# Patient Record
Sex: Female | Born: 1965
Health system: Southern US, Community
[De-identification: ages and names within clinical notes are randomized; demographics above are authoritative.]

## PROBLEM LIST (undated history)

## (undated) DIAGNOSIS — E119 Type 2 diabetes mellitus without complications: Secondary | ICD-10-CM

## (undated) DIAGNOSIS — R011 Cardiac murmur, unspecified: Secondary | ICD-10-CM

## (undated) DIAGNOSIS — F419 Anxiety disorder, unspecified: Secondary | ICD-10-CM

## (undated) DIAGNOSIS — I471 Supraventricular tachycardia, unspecified: Secondary | ICD-10-CM

## (undated) DIAGNOSIS — Z9889 Other specified postprocedural states: Secondary | ICD-10-CM

## (undated) DIAGNOSIS — E785 Hyperlipidemia, unspecified: Secondary | ICD-10-CM

## (undated) DIAGNOSIS — I10 Essential (primary) hypertension: Secondary | ICD-10-CM

## (undated) HISTORY — PX: ABDOMINAL HYSTERECTOMY: SHX81

## (undated) HISTORY — DX: Type 2 diabetes mellitus without complications: E11.9

## (undated) HISTORY — DX: Hyperlipidemia, unspecified: E78.5

## (undated) HISTORY — PX: BREAST BIOPSY: SHX20

## (undated) HISTORY — DX: Anxiety disorder, unspecified: F41.9

## (undated) HISTORY — DX: Cardiac murmur, unspecified: R01.1

## (undated) HISTORY — PX: OTHER SURGICAL HISTORY: SHX169

## (undated) HISTORY — DX: Essential (primary) hypertension: I10

## (undated) HISTORY — PX: COLONOSCOPY: SHX174

---

## 2006-02-10 ENCOUNTER — Ambulatory Visit: Payer: Self-pay | Admitting: Obstetrics and Gynecology

## 2006-10-28 ENCOUNTER — Emergency Department: Payer: Self-pay | Admitting: Internal Medicine

## 2012-06-28 ENCOUNTER — Ambulatory Visit: Payer: Self-pay | Admitting: Family Medicine

## 2012-08-13 ENCOUNTER — Emergency Department: Payer: Self-pay | Admitting: Emergency Medicine

## 2012-08-13 LAB — URINALYSIS, COMPLETE
Glucose,UR: NEGATIVE mg/dL (ref 0–75)
Ketone: NEGATIVE
Leukocyte Esterase: NEGATIVE
Nitrite: NEGATIVE
Ph: 7 (ref 4.5–8.0)
Protein: NEGATIVE
RBC,UR: <1 /HPF (ref 0–5)
WBC UR: 1 /HPF (ref 0–5)

## 2012-08-13 LAB — COMPREHENSIVE METABOLIC PANEL
Albumin: 3.4 g/dL (ref 3.4–5.0)
Alkaline Phosphatase: 55 U/L (ref 50–136)
Anion Gap: 10 (ref 7–16)
BUN: 9 mg/dL (ref 7–18)
Bilirubin,Total: 0.2 mg/dL (ref 0.2–1.0)
Calcium, Total: 7.8 mg/dL — ABNORMAL LOW (ref 8.5–10.1)
Chloride: 110 mmol/L — ABNORMAL HIGH (ref 98–107)
Creatinine: 0.61 mg/dL (ref 0.60–1.30)
Glucose: 101 mg/dL — ABNORMAL HIGH (ref 65–99)
Potassium: 3.5 mmol/L (ref 3.5–5.1)
SGPT (ALT): 18 U/L (ref 12–78)
Sodium: 144 mmol/L (ref 136–145)
Total Protein: 6.5 g/dL (ref 6.4–8.2)

## 2012-08-13 LAB — CBC
HCT: 36.2 % (ref 35.0–47.0)
HGB: 12.8 g/dL (ref 12.0–16.0)
MCHC: 35.3 g/dL (ref 32.0–36.0)
MCV: 91 fL (ref 80–100)
Platelet: 218 10*3/uL (ref 150–440)
RBC: 3.96 10*6/uL (ref 3.80–5.20)
RDW: 12.9 % (ref 11.5–14.5)
WBC: 6.3 10*3/uL (ref 3.6–11.0)

## 2012-08-13 LAB — TROPONIN I: Troponin-I: <0.02 ng/mL

## 2012-08-16 ENCOUNTER — Emergency Department: Payer: Self-pay | Admitting: Emergency Medicine

## 2012-08-16 LAB — BASIC METABOLIC PANEL
BUN: 12 mg/dL (ref 7–18)
Calcium, Total: 8.9 mg/dL (ref 8.5–10.1)
Co2: 27 mmol/L (ref 21–32)
EGFR (Non-African Amer.): >60
Glucose: 113 mg/dL — ABNORMAL HIGH (ref 65–99)
Potassium: 3.9 mmol/L (ref 3.5–5.1)
Sodium: 142 mmol/L (ref 136–145)

## 2012-08-16 LAB — CBC WITH DIFFERENTIAL/PLATELET
Basophil %: 0.7 %
Eosinophil %: 3.9 %
MCH: 30.6 pg (ref 26.0–34.0)
MCHC: 33.8 g/dL (ref 32.0–36.0)
MCV: 91 fL (ref 80–100)
Monocyte %: 7.1 %
Neutrophil %: 50.4 %
Platelet: 209 10*3/uL (ref 150–440)

## 2012-08-16 LAB — TROPONIN I: Troponin-I: <0.02 ng/mL

## 2013-07-02 ENCOUNTER — Ambulatory Visit: Payer: Self-pay | Admitting: Family Medicine

## 2013-09-02 ENCOUNTER — Ambulatory Visit: Payer: Self-pay | Admitting: Obstetrics & Gynecology

## 2013-09-02 LAB — BASIC METABOLIC PANEL
Anion Gap: 7 (ref 7–16)
BUN: 15 mg/dL (ref 7–18)
Chloride: 105 mmol/L (ref 98–107)
Co2: 27 mmol/L (ref 21–32)
Creatinine: 0.54 mg/dL — ABNORMAL LOW (ref 0.60–1.30)
EGFR (African American): >60
EGFR (Non-African Amer.): >60
Osmolality: 278 (ref 275–301)
Potassium: 4.2 mmol/L (ref 3.5–5.1)

## 2013-09-02 LAB — HEMOGLOBIN: HGB: 11.3 g/dL — ABNORMAL LOW (ref 12.0–16.0)

## 2013-09-02 LAB — PREGNANCY, URINE: Pregnancy Test, Urine: NEGATIVE m[IU]/mL

## 2013-09-03 ENCOUNTER — Emergency Department: Payer: Self-pay | Admitting: Emergency Medicine

## 2013-09-03 LAB — CBC
HCT: 34 % — ABNORMAL LOW (ref 35.0–47.0)
HGB: 11.4 g/dL — ABNORMAL LOW (ref 12.0–16.0)
MCH: 28.8 pg (ref 26.0–34.0)
MCHC: 33.6 g/dL (ref 32.0–36.0)
MCV: 86 fL (ref 80–100)
Platelet: 282 10*3/uL (ref 150–440)
RBC: 3.97 10*6/uL (ref 3.80–5.20)
RDW: 13.9 % (ref 11.5–14.5)
WBC: 7.2 10*3/uL (ref 3.6–11.0)

## 2013-09-03 LAB — COMPREHENSIVE METABOLIC PANEL
Alkaline Phosphatase: 63 U/L
Anion Gap: 4 — ABNORMAL LOW (ref 7–16)
BUN: 13 mg/dL (ref 7–18)
Bilirubin,Total: 0.1 mg/dL — ABNORMAL LOW (ref 0.2–1.0)
Co2: 29 mmol/L (ref 21–32)
Creatinine: 0.74 mg/dL (ref 0.60–1.30)
Potassium: 3.7 mmol/L (ref 3.5–5.1)
SGOT(AST): 28 U/L (ref 15–37)
SGPT (ALT): 31 U/L (ref 12–78)
Total Protein: 7.8 g/dL (ref 6.4–8.2)

## 2013-09-03 LAB — TROPONIN I: Troponin-I: <0.02 ng/mL

## 2013-09-10 ENCOUNTER — Ambulatory Visit: Payer: Self-pay | Admitting: Obstetrics & Gynecology

## 2013-09-11 LAB — HEMOGLOBIN: HGB: 8.8 g/dL — ABNORMAL LOW (ref 12.0–16.0)

## 2013-09-12 LAB — PATHOLOGY REPORT

## 2013-09-12 LAB — CBC WITH DIFFERENTIAL/PLATELET
Basophil #: 0 10*3/uL (ref 0.0–0.1)
Basophil %: 0.3 %
Eosinophil %: 0.4 %
HCT: 29.9 % — ABNORMAL LOW (ref 35.0–47.0)
HGB: 10.4 g/dL — ABNORMAL LOW (ref 12.0–16.0)
Lymphocyte #: 1.1 10*3/uL (ref 1.0–3.6)
Lymphocyte %: 11 %
MCH: 29.7 pg (ref 26.0–34.0)
MCHC: 34.7 g/dL (ref 32.0–36.0)
Monocyte #: 0.5 x10 3/mm (ref 0.2–0.9)
Monocyte %: 4.7 %
RBC: 3.5 10*6/uL — ABNORMAL LOW (ref 3.80–5.20)
RDW: 13.8 % (ref 11.5–14.5)
WBC: 9.9 10*3/uL (ref 3.6–11.0)

## 2013-09-13 ENCOUNTER — Observation Stay: Payer: Self-pay | Admitting: Internal Medicine

## 2013-09-13 LAB — URINALYSIS, COMPLETE
Bacteria: NONE SEEN
Ketone: NEGATIVE
Nitrite: NEGATIVE
Ph: 8 (ref 4.5–8.0)
Protein: NEGATIVE
Specific Gravity: 1.002 (ref 1.003–1.030)
Squamous Epithelial: NONE SEEN
WBC UR: <1 /HPF (ref 0–5)

## 2013-09-13 LAB — MAGNESIUM: Magnesium: 2.2 mg/dL

## 2013-09-13 LAB — COMPREHENSIVE METABOLIC PANEL
Albumin: 3.7 g/dL (ref 3.4–5.0)
Alkaline Phosphatase: 61 U/L
Anion Gap: 9 (ref 7–16)
BUN: 5 mg/dL — ABNORMAL LOW (ref 7–18)
Calcium, Total: 8.7 mg/dL (ref 8.5–10.1)
Chloride: 97 mmol/L — ABNORMAL LOW (ref 98–107)
Co2: 27 mmol/L (ref 21–32)
Creatinine: 0.68 mg/dL (ref 0.60–1.30)
EGFR (Non-African Amer.): >60
Glucose: 154 mg/dL — ABNORMAL HIGH (ref 65–99)
Potassium: 3.2 mmol/L — ABNORMAL LOW (ref 3.5–5.1)
SGPT (ALT): 21 U/L (ref 12–78)
Total Protein: 7.7 g/dL (ref 6.4–8.2)

## 2013-09-13 LAB — CK-MB
CK-MB: 0.9 ng/mL (ref 0.5–3.6)
CK-MB: 0.7 ng/mL (ref 0.5–3.6)
CK-MB: 0.5 ng/mL (ref 0.5–3.6)

## 2013-09-13 LAB — POTASSIUM: Potassium: 4.7 mmol/L (ref 3.5–5.1)

## 2013-09-13 LAB — TROPONIN I: Troponin-I: <0.02 ng/mL

## 2013-09-21 ENCOUNTER — Emergency Department: Payer: Self-pay | Admitting: Emergency Medicine

## 2013-09-21 LAB — BASIC METABOLIC PANEL
BUN: 13 mg/dL (ref 7–18)
Chloride: 106 mmol/L (ref 98–107)
Creatinine: 0.76 mg/dL (ref 0.60–1.30)
EGFR (African American): >60
EGFR (Non-African Amer.): >60
Glucose: 106 mg/dL — ABNORMAL HIGH (ref 65–99)
Osmolality: 276 (ref 275–301)
Potassium: 4.5 mmol/L (ref 3.5–5.1)
Sodium: 138 mmol/L (ref 136–145)

## 2013-09-21 LAB — TROPONIN I: Troponin-I: <0.02 ng/mL

## 2013-09-21 LAB — CBC
HCT: 32 % — ABNORMAL LOW
HGB: 11 g/dL — ABNORMAL LOW
MCH: 29 pg
MCHC: 34.3 g/dL
MCV: 85 fL
Platelet: 301 x10 3/mm 3
RBC: 3.78 X10 6/mm 3 — ABNORMAL LOW
RDW: 13.7 %
WBC: 8.3 x10 3/mm 3

## 2013-09-26 ENCOUNTER — Encounter: Payer: Self-pay | Admitting: Obstetrics & Gynecology

## 2013-10-07 ENCOUNTER — Emergency Department: Payer: Self-pay | Admitting: Emergency Medicine

## 2013-10-07 LAB — CBC
MCV: 85 fL (ref 80–100)
RBC: 3.64 10*6/uL — ABNORMAL LOW (ref 3.80–5.20)
WBC: 8.9 10*3/uL (ref 3.6–11.0)

## 2013-10-07 LAB — URINALYSIS, COMPLETE
Glucose,UR: NEGATIVE mg/dL (ref 0–75)
Ph: 6 (ref 4.5–8.0)
Specific Gravity: 1.013 (ref 1.003–1.030)
Squamous Epithelial: 1
WBC UR: 63 /HPF (ref 0–5)

## 2013-10-07 LAB — COMPREHENSIVE METABOLIC PANEL
Albumin: 3.3 g/dL — ABNORMAL LOW (ref 3.4–5.0)
Alkaline Phosphatase: 59 U/L
Anion Gap: 7 (ref 7–16)
Calcium, Total: 8.7 mg/dL (ref 8.5–10.1)
Chloride: 103 mmol/L (ref 98–107)
Co2: 25 mmol/L (ref 21–32)
Creatinine: 0.8 mg/dL (ref 0.60–1.30)
EGFR (African American): >60
EGFR (Non-African Amer.): >60
Glucose: 103 mg/dL — ABNORMAL HIGH (ref 65–99)
SGOT(AST): 17 U/L (ref 15–37)

## 2013-10-07 LAB — TROPONIN I: Troponin-I: <0.02 ng/mL

## 2013-10-07 LAB — CK TOTAL AND CKMB (NOT AT ARMC): CK-MB: <0.5 ng/mL — ABNORMAL LOW (ref 0.5–3.6)

## 2013-10-07 LAB — PRO B NATRIURETIC PEPTIDE: B-Type Natriuretic Peptide: 239 pg/mL — ABNORMAL HIGH (ref 0–125)

## 2013-10-08 ENCOUNTER — Emergency Department: Payer: Self-pay | Admitting: Emergency Medicine

## 2013-10-08 LAB — CBC WITH DIFFERENTIAL/PLATELET
Basophil %: 0.5 %
Eosinophil %: 0.6 %
HCT: 31.6 % — ABNORMAL LOW (ref 35.0–47.0)
HGB: 10.6 g/dL — ABNORMAL LOW (ref 12.0–16.0)
Lymphocyte #: 2.3 10*3/uL (ref 1.0–3.6)
Lymphocyte %: 26.6 %
MCH: 29 pg (ref 26.0–34.0)
MCHC: 33.6 g/dL (ref 32.0–36.0)
Monocyte #: 0.8 x10 3/mm (ref 0.2–0.9)
Monocyte %: 9.3 %
Neutrophil #: 5.4 10*3/uL (ref 1.4–6.5)
WBC: 8.6 10*3/uL (ref 3.6–11.0)

## 2013-10-08 LAB — BASIC METABOLIC PANEL
Calcium, Total: 8.6 mg/dL (ref 8.5–10.1)
Chloride: 105 mmol/L (ref 98–107)
Co2: 24 mmol/L (ref 21–32)
Creatinine: 0.62 mg/dL (ref 0.60–1.30)
EGFR (African American): >60
EGFR (Non-African Amer.): >60
Glucose: 98 mg/dL (ref 65–99)
Osmolality: 268 (ref 275–301)

## 2013-10-08 LAB — TROPONIN I: Troponin-I: <0.02 ng/mL

## 2013-10-10 ENCOUNTER — Encounter: Payer: Self-pay | Admitting: Obstetrics & Gynecology

## 2013-10-11 ENCOUNTER — Ambulatory Visit: Payer: Self-pay | Admitting: Neurology

## 2013-10-29 DIAGNOSIS — E785 Hyperlipidemia, unspecified: Secondary | ICD-10-CM | POA: Insufficient documentation

## 2013-10-29 DIAGNOSIS — F419 Anxiety disorder, unspecified: Secondary | ICD-10-CM | POA: Insufficient documentation

## 2013-11-01 ENCOUNTER — Emergency Department: Payer: Self-pay | Admitting: Emergency Medicine

## 2013-11-01 LAB — CBC
HCT: 32.9 % — ABNORMAL LOW (ref 35.0–47.0)
HGB: 11 g/dL — ABNORMAL LOW (ref 12.0–16.0)
MCH: 29.1 pg (ref 26.0–34.0)
MCHC: 33.4 g/dL (ref 32.0–36.0)
MCV: 87 fL (ref 80–100)
PLATELETS: 250 10*3/uL (ref 150–440)
RBC: 3.78 10*6/uL — ABNORMAL LOW (ref 3.80–5.20)
RDW: 14.4 % (ref 11.5–14.5)
WBC: 8.1 10*3/uL (ref 3.6–11.0)

## 2013-11-01 LAB — HEPATIC FUNCTION PANEL A (ARMC)
ALBUMIN: 3.7 g/dL (ref 3.4–5.0)
ALK PHOS: 52 U/L
Bilirubin, Direct: <0.1 mg/dL (ref 0.00–0.20)
Bilirubin,Total: 0.2 mg/dL (ref 0.2–1.0)
SGOT(AST): 21 U/L (ref 15–37)
SGPT (ALT): 24 U/L (ref 12–78)
Total Protein: 7.4 g/dL (ref 6.4–8.2)

## 2013-11-01 LAB — BASIC METABOLIC PANEL
ANION GAP: 5 — AB (ref 7–16)
BUN: 10 mg/dL (ref 7–18)
CHLORIDE: 106 mmol/L (ref 98–107)
CREATININE: 0.69 mg/dL (ref 0.60–1.30)
Calcium, Total: 8.8 mg/dL (ref 8.5–10.1)
Co2: 24 mmol/L (ref 21–32)
EGFR (African American): >60
EGFR (Non-African Amer.): >60
Glucose: 106 mg/dL — ABNORMAL HIGH (ref 65–99)
Osmolality: 270 (ref 275–301)
Potassium: 4 mmol/L (ref 3.5–5.1)
Sodium: 135 mmol/L — ABNORMAL LOW (ref 136–145)

## 2013-11-01 LAB — LIPASE, BLOOD: Lipase: 228 U/L (ref 73–393)

## 2013-11-01 LAB — TROPONIN I

## 2013-11-02 LAB — URINALYSIS, COMPLETE
Bacteria: NONE SEEN
Bilirubin,UR: NEGATIVE
Blood: NEGATIVE
Glucose,UR: NEGATIVE mg/dL (ref 0–75)
KETONE: NEGATIVE
Leukocyte Esterase: NEGATIVE
NITRITE: NEGATIVE
PH: 5 (ref 4.5–8.0)
PROTEIN: NEGATIVE
SPECIFIC GRAVITY: 1.004 (ref 1.003–1.030)
Squamous Epithelial: NONE SEEN
WBC UR: <1 /HPF (ref 0–5)

## 2013-11-02 LAB — CK: CK, TOTAL: 80 U/L (ref 21–215)

## 2013-11-02 LAB — TROPONIN I: Troponin-I: <0.02 ng/mL

## 2013-11-10 ENCOUNTER — Encounter: Payer: Self-pay | Admitting: Obstetrics & Gynecology

## 2013-12-08 ENCOUNTER — Encounter: Payer: Self-pay | Admitting: Obstetrics & Gynecology

## 2013-12-25 LAB — TROPONIN I: Troponin-I: <0.02 ng/mL

## 2013-12-25 LAB — BASIC METABOLIC PANEL
ANION GAP: 3 — AB (ref 7–16)
BUN: 13 mg/dL (ref 7–18)
CHLORIDE: 108 mmol/L — AB (ref 98–107)
Calcium, Total: 8.2 mg/dL — ABNORMAL LOW (ref 8.5–10.1)
Co2: 28 mmol/L (ref 21–32)
Creatinine: 0.69 mg/dL (ref 0.60–1.30)
Glucose: 100 mg/dL — ABNORMAL HIGH (ref 65–99)
Osmolality: 278 (ref 275–301)
Potassium: 4 mmol/L (ref 3.5–5.1)
Sodium: 139 mmol/L (ref 136–145)

## 2013-12-25 LAB — CBC
HCT: 35.8 % (ref 35.0–47.0)
HGB: 11.6 g/dL — AB (ref 12.0–16.0)
MCH: 27.8 pg (ref 26.0–34.0)
MCHC: 32.4 g/dL (ref 32.0–36.0)
MCV: 86 fL (ref 80–100)
PLATELETS: 213 10*3/uL (ref 150–440)
RBC: 4.17 10*6/uL (ref 3.80–5.20)
RDW: 14.6 % — ABNORMAL HIGH (ref 11.5–14.5)
WBC: 7.5 10*3/uL (ref 3.6–11.0)

## 2013-12-26 ENCOUNTER — Observation Stay: Payer: Self-pay | Admitting: Student

## 2013-12-26 LAB — CK TOTAL AND CKMB (NOT AT ARMC)
CK, Total: 69 U/L
CK, Total: 64 U/L
CK-MB: 0.6 ng/mL (ref 0.5–3.6)
CK-MB: <0.5 ng/mL — ABNORMAL LOW (ref 0.5–3.6)

## 2013-12-26 LAB — CK-MB: CK-MB: 0.6 ng/mL (ref 0.5–3.6)

## 2013-12-26 LAB — D-DIMER(ARMC): D-Dimer: 223 ng/ml

## 2013-12-26 LAB — TROPONIN I

## 2014-01-08 ENCOUNTER — Encounter: Payer: Self-pay | Admitting: Obstetrics & Gynecology

## 2014-04-15 ENCOUNTER — Emergency Department: Payer: Self-pay | Admitting: Emergency Medicine

## 2014-04-15 LAB — COMPREHENSIVE METABOLIC PANEL
ALK PHOS: 52 U/L
ALT: 19 U/L (ref 12–78)
ANION GAP: 6 — AB (ref 7–16)
Albumin: 4 g/dL (ref 3.4–5.0)
BUN: 14 mg/dL (ref 7–18)
Bilirubin,Total: 0.4 mg/dL (ref 0.2–1.0)
CHLORIDE: 102 mmol/L (ref 98–107)
Calcium, Total: 9 mg/dL (ref 8.5–10.1)
Co2: 29 mmol/L (ref 21–32)
Creatinine: 0.72 mg/dL (ref 0.60–1.30)
EGFR (African American): >60
Glucose: 105 mg/dL — ABNORMAL HIGH (ref 65–99)
Osmolality: 275 (ref 275–301)
Potassium: 3.4 mmol/L — ABNORMAL LOW (ref 3.5–5.1)
SGOT(AST): 12 U/L — ABNORMAL LOW (ref 15–37)
Sodium: 137 mmol/L (ref 136–145)
Total Protein: 8.1 g/dL (ref 6.4–8.2)

## 2014-04-15 LAB — PROTIME-INR
INR: 1
PROTHROMBIN TIME: 13 s (ref 11.5–14.7)

## 2014-04-15 LAB — CBC
HCT: 38.7 % (ref 35.0–47.0)
HGB: 13 g/dL (ref 12.0–16.0)
MCH: 29.9 pg (ref 26.0–34.0)
MCHC: 33.6 g/dL (ref 32.0–36.0)
MCV: 89 fL (ref 80–100)
Platelet: 216 10*3/uL (ref 150–440)
RBC: 4.34 10*6/uL (ref 3.80–5.20)
RDW: 13.7 % (ref 11.5–14.5)
WBC: 8.4 10*3/uL (ref 3.6–11.0)

## 2014-04-15 LAB — CK TOTAL AND CKMB (NOT AT ARMC)
CK, Total: 101 U/L
CK-MB: 0.8 ng/mL (ref 0.5–3.6)

## 2014-04-15 LAB — TROPONIN I

## 2014-04-15 LAB — D-DIMER(ARMC): D-Dimer: 122 ng/ml

## 2014-06-30 ENCOUNTER — Ambulatory Visit: Payer: Self-pay | Admitting: Obstetrics & Gynecology

## 2014-07-28 ENCOUNTER — Ambulatory Visit: Payer: Self-pay | Admitting: Family Medicine

## 2014-08-15 ENCOUNTER — Emergency Department: Payer: Self-pay | Admitting: Emergency Medicine

## 2014-08-15 LAB — CBC
HCT: 38.7 % (ref 35.0–47.0)
HGB: 13.1 g/dL (ref 12.0–16.0)
MCH: 31.5 pg (ref 26.0–34.0)
MCHC: 34 g/dL (ref 32.0–36.0)
MCV: 93 fL (ref 80–100)
Platelet: 207 10*3/uL (ref 150–440)
RBC: 4.17 10*6/uL (ref 3.80–5.20)
RDW: 13 % (ref 11.5–14.5)
WBC: 8.8 10*3/uL (ref 3.6–11.0)

## 2014-08-15 LAB — BASIC METABOLIC PANEL
Anion Gap: 9 (ref 7–16)
BUN: 18 mg/dL (ref 7–18)
CALCIUM: 8.8 mg/dL (ref 8.5–10.1)
CHLORIDE: 103 mmol/L (ref 98–107)
Co2: 28 mmol/L (ref 21–32)
Creatinine: 0.72 mg/dL (ref 0.60–1.30)
EGFR (Non-African Amer.): >60
Glucose: 122 mg/dL — ABNORMAL HIGH (ref 65–99)
Osmolality: 283 (ref 275–301)
POTASSIUM: 3.6 mmol/L (ref 3.5–5.1)
SODIUM: 140 mmol/L (ref 136–145)

## 2014-08-16 LAB — TROPONIN I

## 2014-10-08 ENCOUNTER — Emergency Department: Payer: Self-pay | Admitting: Emergency Medicine

## 2014-10-08 LAB — BASIC METABOLIC PANEL
Anion Gap: 9 (ref 7–16)
BUN: 15 mg/dL (ref 7–18)
CHLORIDE: 105 mmol/L (ref 98–107)
CO2: 25 mmol/L (ref 21–32)
CREATININE: 0.76 mg/dL (ref 0.60–1.30)
Calcium, Total: 8.8 mg/dL (ref 8.5–10.1)
EGFR (African American): >60
EGFR (Non-African Amer.): >60
Glucose: 98 mg/dL (ref 65–99)
Osmolality: 278 (ref 275–301)
Potassium: 3.8 mmol/L (ref 3.5–5.1)
SODIUM: 139 mmol/L (ref 136–145)

## 2014-10-08 LAB — CBC
HCT: 37.9 % (ref 35.0–47.0)
HGB: 12.7 g/dL (ref 12.0–16.0)
MCH: 31.2 pg (ref 26.0–34.0)
MCHC: 33.5 g/dL (ref 32.0–36.0)
MCV: 93 fL (ref 80–100)
PLATELETS: 220 10*3/uL (ref 150–440)
RBC: 4.07 10*6/uL (ref 3.80–5.20)
RDW: 13.1 % (ref 11.5–14.5)
WBC: 7 10*3/uL (ref 3.6–11.0)

## 2014-10-08 LAB — TROPONIN I: Troponin-I: <0.02 ng/mL

## 2014-10-08 LAB — SEDIMENTATION RATE: ERYTHROCYTE SED RATE: 10 mm/h (ref 0–30)

## 2015-01-30 NOTE — H&P (Signed)
PATIENT NAME:  Felicia Barnett, Felicia Barnett MR#:  [id number] DATE OF BIRTH:  1965/11/22  DATE OF ADMISSION:  09/13/2013  REFERRING PHYSICIAN:  Dr. Sharma Covert.   PRIMARY CARE PHYSICIAN:  Lindly  CHIEF COMPLAINT:  Palpitations, chest pain.   HISTORY OF PRESENT ILLNESS:  A 49 year old Hispanic female with past medical history of hypertension, hyperlipidemia and recent hysterectomy, presenting with chest pain and palpitations.  She experienced palpitations and chest pain acutely.  She described her chest pain as retrosternal in nature, described as fluttering, 2 to 3 out of 10 in intensity, nonradiating, acute onset without associated shortness of breath, diaphoresis or nausea.  On EMS arrival, she was found to be in SVT with a heart rate around 160, improved transiently with vagal maneuvers, though upon presentation to the ED went back into SVT, heart rate remained to 150s to 160s, requiring adenosine.  She is now without complaints and normal sinus rhythm.  She has also however been complaining of left groin pain present for 6 days total in duration.  It is sharp and stabbing in quality, 10 out of 10 in intensity, located in the anterior portion of her leg with minimal radiation down her leg, worsened by walking.  No relieving factors.  No further associated symptoms.   REVIEW OF SYSTEMS:  CONSTITUTIONAL:  Denies fever, fatigue, weakness.  EYES:  Denies blurred vision, double vision, eye pain.  EARS, NOSE, THROAT:  Denies tinnitus, ear pain, hearing loss.  RESPIRATORY:  Denies cough, wheeze, shortness of breath.  CARDIOVASCULAR:  Positive for palpitations and chest pain as described above.  However, denies any edema or orthopnea.  GASTROINTESTINAL:  Denies nausea, vomiting, diarrhea, abdominal pain.  GENITOURINARY:  Denies dysuria, hematuria.  ENDOCRINE:  Denies nocturia or polyuria.  HEMATOLOGY AND LYMPHATIC:  Denies easy bruising or bleeding.  SKIN:  Denies rash or lesions. MUSCULOSKELETAL:  Denies pain in  the neck, back, shoulder, knees or arthritic symptoms.  Positive for hip pain as described above.  NEUROLOGIC:  Denies any paralysis, paresthesias.  PSYCHIATRIC:  Denies anxiety or depressive symptoms.  Otherwise, full review of systems performed by me is negative.   PAST MEDICAL HISTORY:  Hypertension, hyperlipidemia.   SOCIAL HISTORY:  Denies any alcohol, tobacco or drug usage.   FAMILY HISTORY:  Positive for hypertension and diabetes in both of her parents.   ALLERGIES:  No known drug allergies.   HOME MEDICATIONS:  Tribenzor 5/12.5/40 mg by mouth daily, Zocor 20 mg by mouth daily, vitamin D3 2000 international units by mouth daily, pantoprazole 40 mg by mouth daily.   PHYSICAL EXAMINATION: VITAL SIGNS:  Temperature 98.6, heart rate currently 76, respirations 12, blood pressure 109/61, saturating 100% on room air.  Weight 78.9 kg, BMI 34.  GENERAL:  Well-nourished, well-developed Hispanic female who is currently in no acute distress.  HEAD:  Normocephalic, atraumatic.  EYES:  Pupils equal, round and reactive to light.  Extraocular muscles intact.  No scleral icterus.  MOUTH:  Moist mucosal membranes.  Dentition intact.   EAR, NOSE, THROAT:  Throat clear without exudates.  No external lesions.  NECK:  Supple.  No thyromegaly.  No nodules.  No JVD.  PULMONARY:  Clear to auscultation bilaterally.  No wheezes, rales or rhonchi.  No use of accessory muscles.  Good respiratory effort.  CHEST:  Nontender to palpation.  CARDIOVASCULAR:  S1, S2, regular rate and rhythm.  No murmurs, rubs or gallops.  No edema.  Pedal pulses 2+ bilaterally.  GASTROINTESTINAL:  Soft, mildly tender over the  surgical site as appropriate, nondistended.  No masses.  Positive bowel sounds.  No hepatosplenomegaly.  MUSCULOSKELETAL:  No swelling, clubbing or edema.  Range of motion is limited of the left hip extensors secondary to pain and she does have minimal point tenderness over the anterior portion of the hip.   NEUROLOGIC:  Cranial nerves II through XII intact.  No gross focal neurological deficits.  Sensation intact.  Reflexes intact.  SKIN:  No ulcerations, lesions, rashes or cyanosis.  Skin warm, dry.  Turgor is intact.  PSYCHIATRIC:  Mood and affect within normal limits.  The patient awake, alert and oriented x 3.  Insight and judgment intact.   LABORATORY DATA:  TSH 0.67.  WBC 9, hemoglobin 10.4, platelets of 208.  Urinalysis negative for evidence of infection.  EKG performed revealing supraventricular tachycardia, heart rate of 160.  Repeat EKG revealed sinus tachycardia, heart rate of 116 without ST or T wave abnormalities.   ASSESSMENT AND PLAN:  A 49 year old Hispanic female with past medical history of hypertension, hyperlipidemia, recent hysterectomy, presenting with palpitations, found to be in supraventricular tachycardia.   1.  Supraventricular tachycardia, currently in normal sinus rhythm, heart rate in the 70s.  We will check a BMP as well as magnesium as this has not been ordered by the Emergency Department thus far.  We will correct electrolytes as required to keep potassium between 4 and 5 and magnesium greater than 2.  2.  Left groin pain.  We will check an x-ray.  No obvious abnormalities on exam other than subjective pain.  Provide pain control as well as add the addition of a bowel regimen.  3.  Hypertension.  Continue home medications of Tribenzor. 4.  Hyperlipidemia with Zocor.  5.  DVT prophylaxis with heparin subQ.  6.  THE PATIENT IS FULL CODE.   TIME SPENT:  45 minutes.    ____________________________ Cletis Athensavid K. Cidney Kirkwood, MD dkh:ea D: 09/13/2013 01:26:43 ET T: 09/13/2013 02:14:04 ET JOB#: [id number]  cc: Cletis Athensavid K. Rochelle Larue, MD, <Dictator> Diandra Cimini Synetta ShadowK Eliane Hammersmith MD ELECTRONICALLY SIGNED 09/13/2013 3:28

## 2015-01-30 NOTE — Op Note (Signed)
PATIENT NAME:  Felicia Barnett, Felicia Barnett MR#:  [id number] DATE OF BIRTH:  [birth year]  DATE OF PROCEDURE:  09/10/2013  PREOPERATIVE DIAGNOSIS: Fibroid uterus with menorrhagia.   POSTOPERATIVE DIAGNOSIS: Fibroid uterus with menorrhagia.  PROCEDURE: Total laparoscopic hysterectomy with bilateral salpingectomy and cystoscopy.   SURGEON: Annamarie MajorPaul Jelene Albano, M.D.   ASSISTANT: Dr. Jean RosenthalJackson.   ANESTHESIA: General.   ESTIMATED BLOOD LOSS: 300 mL.   COMPLICATIONS: None.   FINDINGS: Enlarged fibroid uterus, simple right ovarian cyst that is aspirated and cystoscopy reveals patent bilateral ureters.   DISPOSITION: To recovery in stable condition.   TECHNIQUE: The patient is prepped and draped in the usual sterile fashion after adequate anesthesia is obtained in the dorsal lithotomy position. Foley catheter is inserted. A VCare device was placed on the uterus and cervix for manipulation purposes.   Attention is then turned to the abdomen where a Veress needle is inserted through a 5 mm infraumbilical incision after Marcaine is used to anesthetize the skin. Veress needle placement is confirmed using the hanging drop technique and the abdomen is then insufflated with CO2 gas. A 5 mm trocar is inserted under visualization with laparoscope with no injuries or bleeding noted. The patient is placed in Trendelenburg position and above findings are visualized. An 11 mm trocar is placed in the right lower quadrant and a 5 mm trocar is placed in the left lower quadrant lateral to the inferior epigastric blood vessels with no injuries or bleeding noted.   Using the 5 mm Harmonic scalpel, the fallopian tubes were carefully dissected from underneath the salpinx preserving the ovaries and their main blood supply. The round ligaments are carefully coagulated and cut using a Harmonic scalpel and then the uterine ovarian blood vessels and ligaments were carefully coagulated and cut and a full broad ligament was dissected down to the  level of the uterine arteries. Uterine arteries were carefully clamped and transected and coagulated using bipolar cautery device and the Harmonic scalpel. The serosa over the anterior and posterior lower uterine segment and cervix was dissected with inferior retraction of the bladder. The VCare device was identified at the cervicovaginal junction tinting through the  structures and a circumferential incision is made with the Harmonic scalpel to completely amputate the uterus and cervix along with the fallopian tubes. They are then removed vaginally and a sponge is placed in the vagina to maintain pneumoperitoneum.   The vaginal cuff is closed with interrupted 0 Polysorb sutures using the Endo Stitch device. Five sutures were placed and bimanual exam reveals excellent closure. Additional sutures placed to obtain hemostasis to oversew the stitch line. Also, Felicia Barnett is used to help obtain hemostasis in this area.   Cystoscopy was performed with saline distention of the bladder and observation of the ureters revealed bilateral emission of urine through each ureteral orifice into the bladder. There were no lacerations or cystotomies noted in the bladder. Foley catheters were inserted.  The pelvic cavity was irrigated with aspiration of all fluid and hemostasis is assured using the Kleppinger device as well as the Arista as previously mentioned. The right ovary with the cyst is carefully drained with clear fluid noted. Gas is expelled and trocars are removed and skin is closed with Dermabond. The patient goes to the recovery room in stable condition. All sponge, instrument and needle counts are correct.   ____________________________ R. Annamarie MajorPaul Tylik Treese, MD rph:aw D: 09/10/2013 13:15:00 ET T: 09/10/2013 13:38:05 ET JOB#: [id number]  cc: Dierdre Searles. Paul Ziya Coonrod, MD, <Dictator> Nadara MustardOBERT P Jeromy Borcherding MD ELECTRONICALLY  SIGNED 09/11/2013 7:47

## 2015-01-30 NOTE — Discharge Summary (Signed)
PATIENT NAME:  Felicia Barnett, Felicia Barnett MR#:  [id number] DATE OF BIRTH:  1966/02/09  DATE OF ADMISSION:  09/13/2013 DATE OF DISCHARGE:  09/14/2013  PRESENTING COMPLAINT:  Tachycardia, palpitations.   DISCHARGE DIAGNOSES: 1.  Supraventricular tachycardia, resolved.  2.  Hypertension.  3.  Left leg thigh pain, appears musculoskeletal.  4.  Hyperlipidemia.  5.  History of status post recent hysterectomy.    CONDITION ON DISCHARGE:  Fair.   MEDICATIONS: 1.  Acetaminophen/hydrocodone 325/5 mg 1 tablet every four hours as needed.  2.  Simvastatin daily.  3.  Vitamin D3 50,000 international units daily.  4.  Olmesartan 40 mg by mouth daily.  5.  Alprazolam 0.25 twice daily as needed.  6.  Metoprolol 25 mg twice daily.  7.  Cyclobenzaprine 10 mg three times daily.   DIET:  Low-fat, low-cholesterol, low-sodium diet.   FOLLOW-UP:   1.  With Dr. Tiburcio PeaHarris as outpatient GYN.  2.  Follow up with [facility] MaskSheryl Lindley PCP in 1 to 2 weeks.  3.  Outpatient physical therapy.   LABORATORY DATA:  Cardiac enzymes x 3 negative.  Ultrasound of the pelvis appears essentially negative.  Right ovary could not be well-demonstrated, transabdominally left ovary appears normal.  uterus surgically absent.  No abnormal-appearing fluid collection or soft tissue mass in the pelvis is demonstrated.  Potassium is 4.7.  Magnesium is 2.2.  Left hip complete x-ray appears no acute osseous abnormality.  UA negative for UTI.  Magnesium is 1.3 on admission.   BRIEF SUMMARY OF HOSPITAL COURSE:  Felicia Barnett is a 49 year old female with past medical history of hypertension, who recently underwent hysterectomy presented with palpitations, found to be in SVT.  She was admitted with:  1.  SVT:  Currently in normal sinus rhythm with heart rate in the 70s.  She received IV adenosine and metoprolol in the Emergency Room.  She has been placed on metoprolol twice daily with heart rate being stable.  Her cardiac enzymes x 3 were negative.  Her  potassium and magnesium were repleted.  2.  Left groin pain since after hysterectomy, appears musculoskeletal.  No obvious abnormality on exam other than subjective pain.  Provided pain control with Percocet and muscle relaxant Flexeril.  Physical therapy saw the patient, recommend a front-wheel walker and outpatient PT, which has been set up.  Ultrasound of the pelvis was negative.  3.  Hypertension, on beta-blockers and Benicar.  4.  Hyperlipidemia, on Zocor.   Hospital stay otherwise remained stable.  The patient remained a FULL CODE.   TIME SPENT:  40 minutes.    ____________________________ Wylie HailSona A. Allena KatzPatel, MD sap:ea D: 09/14/2013 13:37:49 ET T: 09/14/2013 17:25:47 ET JOB#: [id number]  cc: Lashara Urey A. Allena KatzPatel, MD, <Dictator> Willow OraSONA A Brysun Eschmann MD ELECTRONICALLY SIGNED 09/26/2013 10:54

## 2015-01-31 NOTE — Consult Note (Signed)
PATIENT NAME:  Felicia Barnett, Felicia Barnett MR#:  [id number] DATE OF BIRTH:  Mar 09, 1966  DATE OF CONSULTATION:  12/26/2013  REFERRING PHYSICIAN:  Dr. Amado CoeGouru CONSULTING PHYSICIAN:  Lamar BlinksBruce J. Kowalski, MD  REASON FOR CONSULTATION: Chest pain and supraventricular tachycardia.   CHIEF COMPLAINT: "Barnett have had arm and chest pain."   HISTORY OF PRESENT ILLNESS: This is a 49 year old female with known supraventricular tachycardia with rapid rate and palpitations, who has had a recent ablation in January of this year and had multiple medications for recurring palpitations, most consistent with periventricular contractions and ended up with Bystolic. The patient is feeling much better from that standpoint and controlled with her palpitations. The patient has had intermittent episodes of left upper chest discomfort radiating into her arm, occurring spontaneously and relieved spontaneously. In the last 2 days, she has had a significant amount of increase in the frequency of this occurring and intensity. It is not associated with shortness of breath or other significant symptoms or palpitations. The patient has had an EKG showing normal sinus rhythm and troponin and CK-MB are normal. Currently, she is comfortable without chest pain.   REVIEW OF SYSTEMS: The remainder review of systems negative for vision change, ringing in the ears, hearing loss, cough, congestion, heartburn, nausea, vomiting, diarrhea, bloody stools, stomach pain, extremity pain, leg weakness, cramping of the buttocks, known blood clots, headaches, blackouts, dizzy spells, nosebleeds, congestion, trouble swallowing, frequent urination, urination at night, muscle weakness, numbness, anxiety, depression, skin lesions or skin rashes.   PAST MEDICAL HISTORY:  1.  Supraventricular tachycardia.  2.  Palpitations.   FAMILY HISTORY: No family members with early onset of cardiovascular disease or hypertension.   SOCIAL HISTORY: Currently denies alcohol or tobacco  use.   ALLERGIES: As listed.   MEDICATIONS: As listed.   PHYSICAL EXAMINATION:  VITAL SIGNS: Blood pressure 122/68 bilaterally, heart rate 72 upright, reclining, and regular.  GENERAL: She is a well appearing female in no acute distress.  HEENT: No icterus, thyromegaly, ulcers, hemorrhage, or xanthelasma.  CARDIOVASCULAR: Regular rate and rhythm with normal S1 and S2 without murmur, gallop, or rub. PMI is normal size and placement. Carotid upstroke normal without bruit. Jugular venous pressure is normal.  LUNGS: Clear to auscultation with normal respirations.  ABDOMEN: Soft, nontender, without hepatosplenomegaly or masses. Abdominal aorta is normal size without bruit.  EXTREMITIES: 2+ bilateral pulses in dorsal, pedal, radial and femoral arteries without lower extremity edema, signs of clubbing or ulcers.  NEUROLOGIC: She is oriented to time, place, and person, with normal mood and affect.   ASSESSMENT: This is a 49 year old female with supraventricular tachycardia, status post ablation with palpitations, periventricular contractions and new onset chest discomfort with radiation to arm without evidence of myocardial infarction, needing further treatment options.   RECOMMENDATIONS:  1.  Continue Bystolic for palpitations and heart rate control.  2.  Treadmill Myoview to assess exercise tolerance, myocardial ischemia, cause of chest pain and induction of rhythm disturbance.  3.  Further investigation of possible other causes including secondary discomfort from ablation.  4.  Further investigation of inflammatory changes from ablation.  ____________________________ Lamar BlinksBruce J. Kowalski, MD bjk:aw D: 12/26/2013 08:57:10 ET T: 12/26/2013 09:14:56 ET JOB#: [id number]  cc: Lamar BlinksBruce J. Kowalski, MD, <Dictator> Lamar BlinksBRUCE J KOWALSKI MD ELECTRONICALLY SIGNED 01/01/2014 12:31

## 2015-01-31 NOTE — Discharge Summary (Signed)
PATIENT NAME:  Felicia Barnett, Felicia Barnett MR#:  [id number] DATE OF BIRTH:  08-31-1966  DATE OF ADMISSION:  12/26/2013 DATE OF DISCHARGE:  12/26/2013  PRIMARY CARDIOLOGIST:  Dr. Darrold JunkerParaschos.   CHIEF COMPLAINT:  Chest pain.   DISCHARGE DIAGNOSES: 1.  Chest pain, atypical, ruled out for acute coronary syndrome.  2.  Paroxysmal supraventricular tachycardia, status post ablation recently.  3.  Hypertension.  4.  Hyperlipidemia.   DISCHARGE MEDICATIONS:  Benicar 40 mg once a day, simvastatin 20 mg once a day, vitamin D3 2000 units once a day, Super B complex vitamin B complex 1 tab once a day, trazodone 50 mg 1/2 tab once a day at bedtime, nebivolol 2.5 mg once a day in the morning, hydrochlorothiazide 12.5 mg once a day at night.   DIET:  Low sodium.   ACTIVITY:  As tolerated.   FOLLOWUP:  Please follow with PCP and cardiologist within 1 to 2 weeks.   DISPOSITION:  Home.   SIGNIFICANT LABORATORY AND IMAGING STUDIES:  Troponins were negative x 3.  BUN was 13, creatinine 0.69, potassium 4, white count of 7.5.  Ultrasound upper extremity on the left for pain did not show any DVT.  Chest x-ray, PA and lateral, negative exam.   HISTORY OF PRESENT ILLNESS AND HOSPITAL COURSE:  For full details of H and P, please see the dictation on earlier today by Dr. Amado CoeGouru, but briefly this is a 49 year old Hispanic female with history of paroxysmal SVT, [facility] post ablation January 21st who has been on Bystolic since, history of hypertension who came in for episode of chest pain.  There is some association of nausea and dizziness.  She was admitted to the hospitalist service as she was supposed to have a stress test as an outpatient next week anyway.  She was ruled out for acute coronary syndrome and cardiology was consulted.  She underwent a stress test which was deemed to be a low risk scan with EF of 70% without significant wall motion abnormalities or significant ischemia.  She had stated that  she had been also having some dizziness with Bystolic after the dose was increased from 2.5 to 5 mg in the last week or so because her pressures were high.  The dose was brought back down to 2.5 and hydrochlorothiazide was added to her night blood pressure regimen.  At this point, she has no further chest pain and she will be discharged to outpatient follow-up.    PHYSICAL EXAMINATION:  VITAL SIGNS:  On the day of discharge, temperature was 98.1, pulse rate 61, respiratory rate 19, blood pressure 99/61, O2 sat 99% on room air.  GENERAL:  The patient is an obese female lying in bed, in no obvious distress.  HEENT:  Normocephalic, atraumatic.  LUNGS:  Clear to auscultation.  HEART:  Normal S1, S2.  No significant murmurs appreciated.  ABDOMEN:  Soft, nontender, nondistended.  EXTREMITIES:  No pitting edema.   At this point, she will be discharged with outpatient with Dr. Darrold JunkerParaschos, her primary cardiologist.    Total time spent is 40 minutes.   CODE STATUS:  THE PATIENT IS A FULL CODE.     ____________________________ Krystal EatonShayiq Brittie Whisnant, MD sa:ea D: 12/26/2013 18:13:08 ET T: 12/27/2013 02:16:44 ET JOB#: [id number]  cc: Krystal EatonShayiq Addam Goeller, MD, <Dictator> Krystal EatonSHAYIQ Sireen Halk MD ELECTRONICALLY SIGNED 01/09/2014 15:51

## 2015-01-31 NOTE — H&P (Signed)
PATIENT NAME:  Felicia Barnett, Felicia Barnett MR#:  [id number] DATE OF BIRTH:  [birth year]  DATE OF ADMISSION:  12/26/2013  PRIMARY CARE PHYSICIAN:  Franco Nones.  REFERRING PHYSICIAN:  Dr. Manson Passey.   PRIMARY CARDIOLOGIST:  Dr. Darrold Junker.   CHIEF COMPLAINT:  Chest pain.   HISTORY OF PRESENT ILLNESS:  The patient is a 49 year old Hispanic female with past medical history of hypertension and hyperlipidemia, recently diagnosed with paroxysmal SVT, was admitted to [facility] and got cardiac ablation done on January 21st and has been taking Bystolic since then, is presenting to the ER with a 45 day history of intermittent episodes of chest pain.  The patient is reporting that she has been experiencing left-sided chest pain following cardiac ablation and this is intermittent in nature.  Chest pain is radiating to the left shoulder and left neck.  She is concerned because this chest pain has been getting worse for the past four days and associated with nausea and dizziness.  Denies any loss of consciousness.  She has been experiencing shortness of breath since yesterday, but denies any diaphoresis or loss of consciousness.  The patient is scheduled to get a stress test done at [facility] next Tuesday.  In the ER, the patient's initial EKG has revealed normal sinus rhythm and first set of troponins were negative.  Aspirin was given in the ER following which her chest pain is significantly improved.  Sister is at bedside.  The patient has been taking Bystolic at home after ablation was done at [facility].  Denies any abdominal pain, nausea, vomiting, diarrhea.  No temperature.   PAST MEDICAL HISTORY:  Hypertension, hyperlipidemia, paroxysmal SVT, chronic left-sided thigh pain following hysterectomy.  She is getting physical therapy regarding that.  PAST SURGICAL HISTORY:  Hysterectomy.   ALLERGIES:  No known drug allergies.   PSYCHOSOCIAL HISTORY:  Lives at home.  Denies smoking, alcohol or illicit drug usage.    FAMILY HISTORY:  Hypertension and diabetes runs in her family.   REVIEW OF SYSTEMS: CONSTITUTIONAL:  Denies any fever, fatigue or weakness.  EYES:  Denies blurry vision, double vision, eye pain.  EARS, NOSE, THROAT:  Denies epistaxis, discharge, tinnitus, ear pain, hearing loss.  RESPIRATORY:  Denies cough, COPD, wheezing.  CARDIOVASCULAR:  Intermittent episodes of chest pain for the past 45 days which has been worse for the past four days.  One hour history of palpitations which has resolved.  Denies any orthopnea.  GASTROINTESTINAL:  Denies nausea, vomiting, diarrhea, abdominal pain.  GENITOURINARY:  No dysuria or hematuria.  GYNECOLOGIC AND BREAST:  Denies breast mass or vaginal discharge.  Had a hysterectomy done.  ENDOCRINE:  Denies polyuria, nocturia, thyroid problems.  HEMATOLOGIC AND LYMPHATIC:  No anemia, easy bruising, bleeding.  INTEGUMENTARY:  No acne, rash, lesions.  MUSCULOSKELETAL:  Chronic left groin and thigh pain.  Denies any neck pain, shoulder pain.  NEUROLOGIC:  Denies per paralysis, paresthesias.  PSYCHIATRIC:  Denies any anxiety, depressive episodes.   HOME MEDICATIONS:  Trazodone 50 mg 1/2 tablet by mouth once daily, B complex vitamin once daily, vitamin D3 once daily, simvastatin 20 mg by mouth at bedtime, Bystolic 5 mg by mouth once daily, Benicar 40 mg by mouth once daily.   PHYSICAL EXAMINATION:  VITAL SIGNS:  Temperature 97.9, pulse 67, respirations 18, blood pressure 130/83, pulse ox is 99%.  GENERAL APPEARANCE:  Not under acute distress.  Moderately built and nourished.  HEENT:  Normocephalic, atraumatic.  Pupils are equally reactive to light and accommodation.  No  scleral icterus.  No conjunctival injection.  No sinus tenderness.  No postnasal drip.  Moist mucous membranes.  NECK:  Supple.  No JVD.  No thyromegaly.  Range of motion is intact.  LUNGS:  Clear to auscultation bilaterally.  No accessory muscle usage.  No anterior chest wall tenderness on  palpation.  CARDIAC:  S1, S2 normal.  Regular rate and rhythm.  No murmurs.  GASTROINTESTINAL:  Soft.  Bowel sounds are positive in all four quadrants.  Nontender, nondistended.  No hepatosplenomegaly.  No masses felt.  NEUROLOGIC:  Awake, alert, oriented x 3.  Motor and sensory grossly intact.  Cranial nerves II through XII are intact.  Reflexes are 2+.  EXTREMITIES:  No edema.  No cyanosis.  No clubbing.  SKIN:  Warm to touch.  Normal turgor.  No rashes.  No lesions.  MUSCULOSKELETAL:  No joint effusion, tenderness, or erythema.  PSYCHIATRIC:  Normal mood and affect.   LABORATORY AND IMAGING STUDIES:  Ultrasound of the left upper extremity, no DVT, negative exam.  Chest x-ray PA and lateral views:  Negative chest.  A 12-lead EKG:  Normal sinus rhythm at 51 beats per minute, normal PR interval.  No acute ST-T wave changes.  Troponin less than 0.02.  WBC 7.5, hemoglobin 11.6, hematocrit 35.8, platelets 213.  MCV is normal.  D-dimer 223.  Chem-8, glucose 100, BUN 13, creatinine 0.69, sodium 139, potassium 4.0, CO2 28.  Anion gap 3.  GFR greater than 60, calcium , serum osmolality 278.   ASSESSMENT AND PLAN:  A 49 year old Hispanic female coming to the ER with a chief complaint of 45 day history of intermittent episodes of chest pain which has been worse for the past four days with radiation to left side of the neck and left shoulder will be admitted with the following assessment and plan.  1.  Left-sided chest pain.  Admit her to telemetry.  We will keep her nothing by mouth.  Cycle cardiac biomarkers.  Acute coronary syndrome protocol with oxygen, nitroglycerin, beta blocker, which will be held in the a.m. for stress test.  We will provide her statin.  Cardiology consult is placed to Dr. Darrold JunkerParaschos.  Stress test is scheduled for a.m.  We will hold her beta blocker in a.m.  2.  Paroxysmal supraventricular tachycardia.  We will resume Bystolic which is her home medication.  3.  Hypertension.  The patient  is on Benicar.  We will resume that.  4.  Hyperlipidemia.  Continue statin.  5.  We will provide gastrointestinal and deep vein thrombosis prophylaxis.  6.  CODE STATUS:  SHE IS FULL CODE.   Plan of care discussed in detail with the patient and her sister at bedside.  They both verbalized understanding of the plan.   Total time spent on the admission is 45 minutes.     ____________________________ Ramonita LabAruna Kenyatta Gloeckner, MD ag:ea D: 12/26/2013 03:05:00 ET T: 12/26/2013 03:33:40 ET JOB#: [id number]  cc: Ramonita LabAruna Cristy Colmenares, MD, <Dictator> Franco Nonesheryl Lindley, FNP-BC Ramonita LabARUNA Graycen Degan MD ELECTRONICALLY SIGNED 01/09/2014 2:50

## 2015-02-18 ENCOUNTER — Ambulatory Visit: Payer: Self-pay | Admitting: Physical Therapy

## 2015-02-20 ENCOUNTER — Ambulatory Visit: Payer: BLUE CROSS/BLUE SHIELD | Admitting: Physical Therapy

## 2015-02-24 ENCOUNTER — Ambulatory Visit: Payer: BLUE CROSS/BLUE SHIELD | Admitting: Physical Therapy

## 2015-02-27 ENCOUNTER — Ambulatory Visit: Payer: BLUE CROSS/BLUE SHIELD | Admitting: Physical Therapy

## 2015-03-03 ENCOUNTER — Ambulatory Visit: Payer: BLUE CROSS/BLUE SHIELD | Admitting: Physical Therapy

## 2015-03-03 ENCOUNTER — Ambulatory Visit: Payer: BLUE CROSS/BLUE SHIELD | Attending: Student | Admitting: Physical Therapy

## 2015-03-03 ENCOUNTER — Encounter: Payer: Self-pay | Admitting: Physical Therapy

## 2015-03-03 DIAGNOSIS — Z7409 Other reduced mobility: Secondary | ICD-10-CM

## 2015-03-03 DIAGNOSIS — IMO0001 Reserved for inherently not codable concepts without codable children: Secondary | ICD-10-CM

## 2015-03-03 DIAGNOSIS — M62838 Other muscle spasm: Secondary | ICD-10-CM | POA: Insufficient documentation

## 2015-03-03 DIAGNOSIS — M609 Myositis, unspecified: Secondary | ICD-10-CM | POA: Diagnosis not present

## 2015-03-03 DIAGNOSIS — S39001S Unspecified injury of muscle, fascia and tendon of abdomen, sequela: Secondary | ICD-10-CM

## 2015-03-03 DIAGNOSIS — M791 Myalgia: Secondary | ICD-10-CM | POA: Insufficient documentation

## 2015-03-04 ENCOUNTER — Encounter: Payer: Self-pay | Admitting: Physical Therapy

## 2015-03-04 NOTE — Patient Instructions (Signed)
   Handout on Reverse Kegel to lengthen pelvic floor.

## 2015-03-04 NOTE — Therapy (Signed)
[facility] MAIN [facility] SERVICES [address] [place], [place], [address] Phone: [phone]   Fax:  [phone]  Physical Therapy Evaluation  Patient Details  Name: Felicia Barnett MRN: [id number] Date of Birth: Sep 04, 1966 Referring Provider:  Purvis Sheffield, MD  Encounter Date: 03/03/2015      PT End of Session - 03/04/15 1706    Visit Number 1   Number of Visits 12   Date for PT Re-Evaluation 05/27/15   PT Start Time 1617   PT Stop Time 1730   PT Time Calculation (min) 73 min   Activity Tolerance Patient tolerated treatment well;No increased pain   Behavior During Therapy [facility] for tasks assessed/performed      Past Medical History  Diagnosis Date  . Hypertension   . Heart murmur     palpatiations  . Hyperlipidemia   . Diabetes mellitus without complication     pre-diabetes  . Anxiety     since surgery, pt has had attacks w/ fear of not being able to move her leg (anxiety attacks occur  3-4x month.)    Past Surgical History  Procedure Laterality Date  . Abdominal hysterectomy    . Cardial ablation      anxiety attack (heart palpitations HR=210 bpm) the day she was released from the hospital from surgery. Findings were neg .    There were no vitals filed for this visit.  Visit Diagnosis:  Myalgia and myositis - Plan: PT plan of care cert/re-cert  Unspecified injury of muscle, fascia and tendon of abdomen, sequela - Plan: PT plan of care cert/re-cert  Impaired mobility - Plan: PT plan of care cert/re-cert      Subjective Assessment - 03/03/15 1744    Subjective Pt has experienced L pelvic pain that radiates down the L medial thigh for the past 1.51 yo. This pain started immediately after her surgery (hysteroctemy 2/2 fibroid  on L). There is one small cyst on L ovary that is being monitored currently. Pain has remained the same despite receiving outpatient PT. Pt loses urine with sneezing, coughing, laughing. denied fecal  leakage. Pt c/o dysparuenia in addition to pain with movements assoicated with hip ER, squatting, and picking objects up from floor and tying her shoes. Pt has anxiety attacks 3-4x/ month with fear of not being able to move her leg. Pt has able to relax herself after the attacks. Pt is sleeping 6 hrs without interruption by pain.    Pertinent History cardiac ablation 2/2 anxiety attack (heart palpitations HR=210 bpm) the day she was released from the hospital. Hx of 2 c-sections, Currently experiencing constipation bowel movement every other day   How long can you stand comfortably? 45 min   Patient Stated Goals feel better, tie shoes, painfree sexual intercourse   Pain Onset Other (comment)  1.49 yo            [facility] PT Assessment - 03/03/15 1642    Assessment   Medical Diagnosis chronic pelvic pain   Onset Date/Surgical Date 09/09/13   Precautions   Precautions None   Restrictions   Weight Bearing Restrictions No   Balance Screen   Has the patient fallen in the past 6 months No   Home Environment   Living Environment Private residence   Type of Home House   Prior Function   Level of Independence Independent   Observation/Other Assessments   Other Surveys  --  pt will return at next visit  Functional Tests   Functional tests Other  picking object out of purse w/ L hip abd; semi golfer's lift   AROM   Overall AROM Comments hip flex limited L/55 R/80  hip ER/abd limited, lat tib plateau to plinth L/27 cm, R/ 15 cm.    PROM   Overall PROM Comments hip IR (supine 90/90)  L/20, R/ 40    Bed Mobility   Bed Mobility Supine to Sit  half crunch, able to log roll with cuing                 Pelvic Floor Special Questions - 03/03/15 1738    Diastasis Recti 1.5 fingers below sternum   External Perineal Exam noted bladder close and behind introitus, able to demo lift of anterior pelvic floor musculature when cued to stop flow of urine   External Palpation tenderness along  groin, ischiocavernosus, superficial perineal mm, coccygeus, adductor magnus L > R   Pelvic Floor Internal Exam Noted flinching tenderness, fascial restrictions, mm tensions on L compared to R.          [facility] Adult PT Treatment/Exercise - 03/03/15 1642    Posture/Postural Control   Posture Comments weight bear R > L on ischial tub in seat   coordination: abdominal straining with cue for bowel movemen                PT Education - 03/04/15 2147    Education provided Yes   Education Details HEP, POC, anatomy,physiology, pain science education   Person(s) Educated Patient   Methods Explanation;Demonstration;Tactile cues;Verbal cues;Handout   Comprehension Verbalized understanding;Returned demonstration             PT Long Term Goals - 03/04/15 2149    PT LONG TERM GOAL #1   Title Pt will gain increased hip IR on L from 20 deg to 40 deg in order to return to fitness routines.    Time 12   Period Weeks   PT LONG TERM GOAL #2   Title Pt will demo increased hip ER/abd ( lat tib plateau to plinth from L/27 cm to L < 20 cm) in order to achieve sexual positions without pain.    Time 12   Period Weeks   Status New   PT LONG TERM GOAL #3   Title Pt will be able to tie shoes without pain and with functional range of motion.    Time 12   Period Weeks   Status New               Plan - 03/04/15 1720    Clinical Impression Statement Pt is a 49 yo who c/o chronic pelvic pain w/ L radiating pain along medial thigh    Pt will benefit from skilled therapeutic intervention in order to improve on the following deficits Abnormal gait;Difficulty walking;Increased muscle spasms;Postural dysfunction;Improper body mechanics;Decreased mobility;Decreased activity tolerance;Decreased endurance;Decreased scar mobility;Increased fascial restricitons;Decreased coordination;Pain;Hypomobility;Decreased range of motion;Impaired flexibility;Decreased strength   Rehab Potential Good    Clinical Impairments Affecting Rehab Potential chronicity of pain   PT Frequency 2x / week   PT Duration 12 weeks   PT Treatment/Interventions ADLs/Self Care Home Management;Aquatic Therapy;Stair training;Cryotherapy;Electrical Stimulation;Moist Heat;Traction;Balance training;Therapeutic exercise;Therapeutic activities;Functional mobility training;Manual techniques;Scar mobilization;Dry needling;Patient/family education;Biofeedback;Other (comment);Neuromuscular re-education  pain science education   PT Next Visit Plan assess SIJ, spine,    PT Home Exercise Plan pelvic stretches   Consulted and Agree with Plan of Care Patient  Problem List There are no active problems to display for this patient.   Mariane MastersYeung,Shin Yiing ,PT, DPT, E-RYT  03/04/2015, 10:04 PM  [place] [facility] [address] [place], [place], [address] Phone: [phone]   Fax:  [phone]

## 2015-03-06 ENCOUNTER — Ambulatory Visit: Payer: BLUE CROSS/BLUE SHIELD | Admitting: Physical Therapy

## 2015-03-11 ENCOUNTER — Encounter: Payer: BLUE CROSS/BLUE SHIELD | Admitting: Physical Therapy

## 2015-03-13 ENCOUNTER — Encounter: Payer: BLUE CROSS/BLUE SHIELD | Admitting: Physical Therapy

## 2015-03-17 ENCOUNTER — Ambulatory Visit: Payer: BLUE CROSS/BLUE SHIELD | Attending: Student | Admitting: Physical Therapy

## 2015-03-17 DIAGNOSIS — R102 Pelvic and perineal pain: Secondary | ICD-10-CM | POA: Insufficient documentation

## 2015-03-17 DIAGNOSIS — M791 Myalgia: Secondary | ICD-10-CM | POA: Insufficient documentation

## 2015-03-17 DIAGNOSIS — X58XXXS Exposure to other specified factors, sequela: Secondary | ICD-10-CM | POA: Insufficient documentation

## 2015-03-17 DIAGNOSIS — S39001S Unspecified injury of muscle, fascia and tendon of abdomen, sequela: Secondary | ICD-10-CM | POA: Diagnosis not present

## 2015-03-17 DIAGNOSIS — G8929 Other chronic pain: Secondary | ICD-10-CM | POA: Insufficient documentation

## 2015-03-17 DIAGNOSIS — IMO0001 Reserved for inherently not codable concepts without codable children: Secondary | ICD-10-CM

## 2015-03-17 DIAGNOSIS — Z7409 Other reduced mobility: Secondary | ICD-10-CM

## 2015-03-17 NOTE — Patient Instructions (Signed)
Handout  Open book 15 reps, double knee to chest 10x with belt/tie Scar massage over lower abdomen, hip IR with palm over L groin to stretch fascia Finding neutral pelvis when laying on back and in standing (apply when washing dishes)

## 2015-03-17 NOTE — Therapy (Signed)
[facility] [address] [place], [place], [address] Phone: [phone]   Fax:  [phone]  Physical Therapy Treatment  Patient Details  Name: Felicia Barnett MRN: [id number] Date of Birth: 02/04/1966 Referring Provider:  No ref. provider found  Encounter Date: 03/17/2015    Past Medical History  Diagnosis Date  . Hypertension   . Heart murmur     palpatiations  . Hyperlipidemia   . Diabetes mellitus without complication     pre-diabetes  . Anxiety     since surgery, pt has had attacks w/ fear of not being able to move her leg (anxiety attacks occur  3-4x month.)    Past Surgical History  Procedure Laterality Date  . Abdominal hysterectomy    . Cardial ablation      anxiety attack (heart palpitations HR=210 bpm) the day she was released from the hospital from surgery. Findings were neg .    There were no vitals filed for this visit.  Visit Diagnosis:  Myalgia and myositis  Unspecified injury of muscle, fascia and tendon of abdomen, sequela  Impaired mobility      Subjective Assessment - 03/17/15 1625    Subjective Pt reported feeling good today but continues to experience the L groin pain with bending over and picking up objects, standing while washing dishes at 6-7/10.     Pertinent History cardiac ablation 2/2 anxiety attack (heart palpitations HR=210 bpm) the day she was released from the hospital. Hx of 2 c-sections, Currently experiencing constipation bowel movement every other day   How long can you stand comfortably? 45 min   Patient Stated Goals feel better, tie shoes, painfree sexual intercourse   Pain Onset Other (comment)  [age] yo            [facility] - 03/17/15 1720    ROM / Strength   AROM / PROM / Strength --  spinal flexion AROM pre-Tx ~60 deg, post-Tx ~120 deg   AROM   Overall AROM Comments double knee to chest full range limited by/ L groin pain pre-Tx, no pain at full  range post-Tx in supine, belt assisted   Palpation   Palpation comment Increased fascial restriction L groin, LQ abdominal scar pore-Tx with tenderness, Post-Tx   POst-Tx: increased fascial mobility, decreased tenderness                     [facility] Adult PT Treatment/Exercise - 03/17/15 1720    Bed Mobility   Bed Mobility --  Required cuing for log rolling   Exercises   Exercises Other Exercises   Other Exercises  Open book 10 reps bil   double kee to chest 5x w/ belt, cuing for exhale knee to che   Modalities   Modalities Moist Heat  10 min, skin intact post-Tx   Manual Therapy   Manual Therapy Joint mobilization;Soft tissue mobilization;Myofascial release   Joint Mobilization T1-T6 increased hypoomobility; applied Grade III 45 sec x 3 PA,    Soft tissue mobilization medial to scapula and paraspinals L    Myofascial Release L groin and medial LQ abdominal scar                PT Education - 03/17/15 1735    Education provided Yes   Education Details HEP, alignment and scars on impact on shortened fascia connections   Person(s) Educated Patient   Methods Explanation;Demonstration;Tactile cues;Verbal cues;Handout   Comprehension Verbalized understanding;Returned demonstration  PT Long Term Goals - 03/04/15 2149    PT LONG TERM GOAL #1   Title Pt will gain increased hip IR on L from 20 deg to 40 deg in order to return to fitness routines.    Time 12   Period Weeks   PT LONG TERM GOAL #2   Title Pt will demo increased hip ER/abd ( lat tib plateau to plinth from L/27 cm to L < 20 cm) in order to achieve sexual positions without pain.    Time 12   Period Weeks   Status New   PT LONG TERM GOAL #3   Title Pt will be able to tie shoes without pain and with functional range of motion.    Time 12   Period Weeks   Status New               Plan - 03/17/15 1737    Clinical Impression Statement Pt showed increased spinal and hip ROM  post-manual (fascial release over abdomen/groin, spinal mobs, STM over midthoracic region). Pt was able to bend forward and pick up purse with decreased pain but pt will still required skilled PT to address internal pelvic floor muscles. Pt was educated on neutral pelvic  alignment with exhalation to activate deep core mm in standing and supine  and pt demo'd correctly.    Pt will benefit from skilled therapeutic intervention in order to improve on the following deficits Abnormal gait;Difficulty walking;Increased muscle spasms;Postural dysfunction;Improper body mechanics;Decreased mobility;Decreased activity tolerance;Decreased endurance;Decreased scar mobility;Increased fascial restricitons;Decreased coordination;Pain;Hypomobility;Decreased range of motion;Impaired flexibility;Decreased strength   Rehab Potential Good   Clinical Impairments Affecting Rehab Potential chronicity of pain   PT Frequency 2x / week   PT Duration 12 weeks   PT Treatment/Interventions ADLs/Self Care Home Management;Aquatic Therapy;Stair training;Cryotherapy;Electrical Stimulation;Moist Heat;Traction;Balance training;Therapeutic exercise;Therapeutic activities;Functional mobility training;Manual techniques;Scar mobilization;Dry needling;Patient/family education;Biofeedback;Other (comment);Neuromuscular re-education  pain science education   PT Next Visit Plan assess SIJ, spine,    PT Home Exercise Plan pelvic stretches   Consulted and Agree with Plan of Care Patient        Problem List There are no active problems to display for this patient.   Mariane Masters ,PT, DPT, E-RYT  03/17/2015, 5:40 PM  [facility] MAIN [facility] SERVICES [address] [place], [place], [address] Phone: [phone]   Fax:  [phone]

## 2015-03-19 ENCOUNTER — Ambulatory Visit: Payer: BLUE CROSS/BLUE SHIELD | Admitting: Physical Therapy

## 2015-03-19 DIAGNOSIS — S39001S Unspecified injury of muscle, fascia and tendon of abdomen, sequela: Secondary | ICD-10-CM

## 2015-03-19 DIAGNOSIS — IMO0001 Reserved for inherently not codable concepts without codable children: Secondary | ICD-10-CM

## 2015-03-19 DIAGNOSIS — R102 Pelvic and perineal pain: Secondary | ICD-10-CM | POA: Diagnosis not present

## 2015-03-19 DIAGNOSIS — Z7409 Other reduced mobility: Secondary | ICD-10-CM

## 2015-03-20 ENCOUNTER — Encounter: Payer: Self-pay | Admitting: Physical Therapy

## 2015-03-20 NOTE — Therapy (Signed)
[place] [facility] MAIN [facility] SERVICES [address] [place], [place], [address] Phone: [phone]   Fax:  [phone]  Physical Therapy Treatment  Patient Details  Name: Felicia Barnett MRN: [id number] Date of Birth: 08/01/1966 Referring Provider:  Purvis Sheffield, MD  Encounter Date: 03/19/2015    Past Medical History  Diagnosis Date  . Hypertension   . Heart murmur     palpatiations  . Hyperlipidemia   . Diabetes mellitus without complication     pre-diabetes  . Anxiety     since surgery, pt has had attacks w/ fear of not being able to move her leg (anxiety attacks occur  3-4x month.)    Past Surgical History  Procedure Laterality Date  . Abdominal hysterectomy    . Cardial ablation      anxiety attack (heart palpitations HR=210 bpm) the day she was released from the hospital from surgery. Findings were neg .    There were no vitals filed for this visit.  Visit Diagnosis:  Myalgia and myositis  Unspecified injury of muscle, fascia and tendon of abdomen, sequela  Impaired mobility      Subjective Assessment - 03/20/15 0847    Subjective Pt reported she only has pain in her low back and has minimal pain in L groin. Pt is able to bend over from standing, sit with hip external rotation and abduction and bend over to the floor to tie her shoes.    Pertinent History cardiac ablation 2/2 anxiety attack (heart palpitations HR=210 bpm) the day she was released from the hospital. Hx of 2 c-sections, Currently experiencing constipation bowel movement every other day   How long can you stand comfortably? 45 min   Patient Stated Goals feel better, tie shoes, painfree sexual intercourse   Pain Onset Other (comment)  1.51 yo                         [facility] Adult PT Treatment/Exercise - 03/20/15 0849    Bed Mobility   Bed Mobility --  log rolling w/ minor cuing   Posture/Postural Control   Posture/Postural Control Postural  limitations  demo'd equal WBing LE w/o antalgic stance   Postural Limitations --  stands w/excessive anterior pelvic tilt   Posture Comments Neuro-reedu ex: pelvic tilt in hooklying and standing by wall  exhalations into posterior tilt (10 reps)   Exercises   Other Exercises  --  relaxation technique w/ positions brathing   Lumbar Exercises: Standing   Other Standing Lumbar Exercises down ward facing dog with belt on doorway (traction)  5 breaths    Knee/Hip Exercises: Stretches   Piriformis Stretch --  seated figure 4 and cross over (spinal twist)  5 breaths bil   Moist Heat Therapy   Number Minutes Moist Heat 5 Minutes   Moist Heat Location Lumbar Spine;Other (comment)  sacrum  Skin intact post-Tx   Manual Therapy   Manual therapy comments Decreased pain post-Tx and pt showed ability to move pelvis into pelvic neutral in stance and seated position  and ex   Joint Mobilization sacral mob inferior/superior Grade II   Soft tissue mobilization skin rolling   Myofascial Release over sacrotuberious ligament bilateral                      PT Long Term Goals - 03/20/15 1045    PT LONG TERM GOAL #1   Title Pt will gain increased  hip IR on L from 20 deg to 40 deg in order to return to fitness routines.    Time 12   Period Weeks   Status Achieved   PT LONG TERM GOAL #2   Title Pt will demo increased hip ER/abd ( lat tib plateau to plinth from L/27 cm to L < 20 cm) in order to achieve sexual positions without pain.    Time 12   Period Weeks   Status Achieved   PT LONG TERM GOAL #3   Title Pt will be able to tie shoes without pain and with functional range of motion.    Time 12   Period Weeks   Status Achieved   PT LONG TERM GOAL #4   Title Pt will demo proper deep core activation without cuing Dynamic Stabilization 1-4 (5 reps each) in order to minimize relapse of Sx in pelvis and back and perform fitness routines.    Time 12   Period Weeks   Status New   PT LONG TERM  GOAL #5   Title Pt will demo pelvic neutral in stance without cuing across 2 sessions.    Time 12   Period Weeks   Status New               Plan - 03/20/15 1048    Clinical Impression Statement Pt achieved her goals to tie her shoes, increase hip ROM and new goals related to deep core strengthening have been added. Pt responded well to manual Tx to relief LBP and was trained to find pelvic neutral alignment in mutliple positions. Emphasized to pt about maintaining ROM HEP to minimize relapse of Sx. Pt will continue to benefit from PT .    Pt will benefit from skilled therapeutic intervention in order to improve on the following deficits Abnormal gait;Difficulty walking;Increased muscle spasms;Postural dysfunction;Improper body mechanics;Decreased mobility;Decreased activity tolerance;Decreased endurance;Decreased scar mobility;Increased fascial restricitons;Decreased coordination;Pain;Hypomobility;Decreased range of motion;Impaired flexibility;Decreased strength   Rehab Potential Good   Clinical Impairments Affecting Rehab Potential chronicity of pain   PT Frequency 2x / week   PT Duration 12 weeks   PT Treatment/Interventions ADLs/Self Care Home Management;Aquatic Therapy;Stair training;Cryotherapy;Electrical Stimulation;Moist Heat;Traction;Balance training;Therapeutic exercise;Therapeutic activities;Functional mobility training;Manual techniques;Scar mobilization;Dry needling;Patient/family education;Biofeedback;Other (comment);Neuromuscular re-education  pain science education   PT Next Visit Plan continue with SIJ manual Tx, deep core stabilization   Consulted and Agree with Plan of Care Patient        Problem List There are no active problems to display for this patient.   Mariane Masters ,PT, DPT, E-RYT  03/20/2015, 10:54 AM  [place] [facility] MAIN [facility] SERVICES [address] [place], [place], [address] Phone: [phone]   Fax:   [phone]

## 2015-03-24 ENCOUNTER — Ambulatory Visit: Payer: BLUE CROSS/BLUE SHIELD | Attending: Student | Admitting: Physical Therapy

## 2015-03-24 DIAGNOSIS — G8929 Other chronic pain: Secondary | ICD-10-CM | POA: Insufficient documentation

## 2015-03-24 DIAGNOSIS — X58XXXS Exposure to other specified factors, sequela: Secondary | ICD-10-CM | POA: Diagnosis not present

## 2015-03-24 DIAGNOSIS — M791 Myalgia: Secondary | ICD-10-CM | POA: Insufficient documentation

## 2015-03-24 DIAGNOSIS — S39001S Unspecified injury of muscle, fascia and tendon of abdomen, sequela: Secondary | ICD-10-CM | POA: Diagnosis not present

## 2015-03-24 DIAGNOSIS — IMO0001 Reserved for inherently not codable concepts without codable children: Secondary | ICD-10-CM

## 2015-03-24 DIAGNOSIS — R102 Pelvic and perineal pain: Secondary | ICD-10-CM | POA: Diagnosis present

## 2015-03-24 DIAGNOSIS — Z7409 Other reduced mobility: Secondary | ICD-10-CM | POA: Insufficient documentation

## 2015-03-24 NOTE — Patient Instructions (Signed)
Continue w/ scar massage Practice abdominal massage (handout) Standing w/ proper alignment  Practice Dyn. Stab, Level 1-2 see below  You are now ready to begin training the deep core muscles system: diaphragm, transverse abdominis, pelvic floor . These muscles must work together as a team.           The key to these exercises to train the brain to coordinate the timing of these muscles and to have them turn on for long periods of time to hold you upright against gravity (especially important if you are on your feet all day).These muscles are postural muscles and play a role stabilizing your spine and bodyweight. By doing these repetitions slowly and correctly instead of doing crunches, you will achieve a flatter belly without a lower pooch. You are also placing your spine in a more neutral position and breathing properly which in turn, decreases your risk for problems related to your pelvic floor, abdominal, and low back such as pelvic organ prolapse, hernias, diastasis recti (separation of superficial muscles), disk herniations, spinal fractures. These exercises set a solid foundation for you to later progress to resistance/ strength training with therabands and weights and return to other typical fitness exercises with a stronger deeper core.

## 2015-03-24 NOTE — Therapy (Signed)
[place] [facility] MAIN [facility] SERVICES [address] [place], [place], [address] Phone: [phone]   Fax:  [phone]  Physical Therapy Treatment  Patient Details  Name: Felicia Barnett MRN: [id number] Date of Birth: Jan 21, 1966 Referring Provider:  Purvis Sheffield, MD  Encounter Date: 03/24/2015      PT End of Session - 03/24/15 1700    Visit Number 4   Number of Visits 12   Date for PT Re-Evaluation 05/27/15   PT Start Time 1600   PT Stop Time 1650   PT Time Calculation (min) 50 min   Activity Tolerance Patient tolerated treatment well;No increased pain   Behavior During Therapy [facility] for tasks assessed/performed      Past Medical History  Diagnosis Date  . Hypertension   . Heart murmur     palpatiations  . Hyperlipidemia   . Diabetes mellitus without complication     pre-diabetes  . Anxiety     since surgery, pt has had attacks w/ fear of not being able to move her leg (anxiety attacks occur  3-4x month.)    Past Surgical History  Procedure Laterality Date  . Abdominal hysterectomy    . Cardial ablation      anxiety attack (heart palpitations HR=210 bpm) the day she was released from the hospital from surgery. Findings were neg .    There were no vitals filed for this visit.  Visit Diagnosis:  Myalgia and myositis  Unspecified injury of muscle, fascia and tendon of abdomen, sequela  Impaired mobility      Subjective Assessment - 03/24/15 1615    Subjective Pt continues to be able to tie her shoes and decreased pain when standing while doing dishes. Pt's back has improved 60%. Pt feels N/T when touching her L inner thigh but not with movement. Pt deferred internal assessment of pelvic floor to next session due to having menstrual -related cramps.    Pertinent History cardiac ablation 2/2 anxiety attack (heart palpitations HR=210 bpm) the day she was released from the hospital. Hx of 2 c-sections, Currently experiencing  constipation bowel movement every other day   How long can you stand comfortably? 45 min   Patient Stated Goals feel better, tie shoes, painfree sexual intercourse   Pain Onset Other (comment)  1.51 yo            [facility] PT Assessment - 03/24/15 1638    AROM   Overall AROM Comments hip flexion 80 bil (pain w/ L); hip ER 21 cm on L tat tib to table, R 15 cm.     PROM   Overall PROM Comments hip IR (supine 90/90) L/20, R/hip IR (supine 90/90) bil  40  40    Palpation   Palpation comment no fascial tensions in L groin, moderate immobility over L end of horizontal scar over LQ abdomen   Bed Mobility   Bed Mobility Supine to Sit  log rolling                     [facility] Adult PT Treatment/Exercise - 03/24/15 1638    Posture/Postural Control   Posture Comments cuing for equal WBing in stance  initiated Dynamic Stabilization 1-2   Manual Therapy   Manual Therapy Soft tissue mobilization;Myofascial release   Soft tissue mobilization abdominal massage, also guided pt   Myofascial Release scar mobilization over LQ abdominal scar  PT Education - 03/24/15 1658    Education provided Yes   Education Details HEP, progression of exercises for deep core srengthening, benefits for abdominal massage, and website for healthy recipes pre pt's interest in healthier eating habits   Person(s) Educated Patient   Methods Explanation;Demonstration;Tactile cues;Verbal cues;Handout   Comprehension Verbalized understanding;Returned demonstration             PT Long Term Goals - 03/24/15 1617    PT LONG TERM GOAL #1   Title Pt will gain increased hip IR on L from 20 deg to 40 deg in order to return to fitness routines.    Time 12   Period Weeks   Status Achieved   PT LONG TERM GOAL #2   Title Pt will demo increased hip ER/abd ( lat tib plateau to plinth from L/27 cm to L < 20 cm) in order to achieve sexual positions without pain.    Time 12   Period Weeks    Status Achieved   PT LONG TERM GOAL #3   Title Pt will be able to tie shoes without pain and with functional range of motion.    Time 12   Period Weeks   Status Achieved   PT LONG TERM GOAL #4   Title Pt will demo proper deep core activation without cuing Dynamic Stabilization 1-4 (5 reps each) in order to minimize relapse of Sx in pelvis and back and perform fitness routines.    Time 12   Period Weeks   Status New   PT LONG TERM GOAL #5   Title Pt will demo pelvic neutral in stance without cuing across 2 sessions.    Time 12   Period Weeks   Status New   Additional Long Term Goals   Additional Long Term Goals Yes   PT LONG TERM GOAL #6   Title Pt will report no leakage with coughing and no pain with sexual intercourse in order to perform her ADLs.   Time 12   Period Weeks   Status New               Plan - 03/24/15 1701    Clinical Impression Statement Pt showed significantly increased hip IR, hip abd/ER and ability to bend over to tie shoes. Pt also showed decreased abdominal scar immobility. Pt has achieved 50% of her goals and anticipate pt will continue to progress well with continued skilled PT to address deep core strengthening  and scar immobility.    Pt will benefit from skilled therapeutic intervention in order to improve on the following deficits Abnormal gait;Difficulty walking;Increased muscle spasms;Postural dysfunction;Improper body mechanics;Decreased mobility;Decreased activity tolerance;Decreased endurance;Decreased scar mobility;Increased fascial restricitons;Decreased coordination;Pain;Hypomobility;Decreased range of motion;Impaired flexibility;Decreased strength   Rehab Potential Good   Clinical Impairments Affecting Rehab Potential chronicity of pain   PT Frequency 2x / week   PT Duration 12 weeks   PT Treatment/Interventions ADLs/Self Care Home Management;Aquatic Therapy;Stair training;Cryotherapy;Electrical Stimulation;Moist Heat;Traction;Balance  training;Therapeutic exercise;Therapeutic activities;Functional mobility training;Manual techniques;Scar mobilization;Dry needling;Patient/family education;Biofeedback;Other (comment);Neuromuscular re-education  pain science education   PT Next Visit Plan internal pelvic re assessment   Consulted and Agree with Plan of Care Patient        Problem List There are no active problems to display for this patient.   Mariane Masters ,PT, DPT, E-RYT 03/24/2015, 5:04 PM  [place] [facility] MAIN [facility] SERVICES [address] [place], [place], [address] Phone: [phone]   Fax:  [phone]

## 2015-03-26 ENCOUNTER — Ambulatory Visit: Payer: BLUE CROSS/BLUE SHIELD | Admitting: Physical Therapy

## 2015-03-26 DIAGNOSIS — IMO0001 Reserved for inherently not codable concepts without codable children: Secondary | ICD-10-CM

## 2015-03-26 DIAGNOSIS — R102 Pelvic and perineal pain: Secondary | ICD-10-CM | POA: Diagnosis not present

## 2015-03-26 DIAGNOSIS — Z7409 Other reduced mobility: Secondary | ICD-10-CM

## 2015-03-26 DIAGNOSIS — S39001S Unspecified injury of muscle, fascia and tendon of abdomen, sequela: Secondary | ICD-10-CM

## 2015-03-27 NOTE — Therapy (Signed)
[place] [facility] MAIN [facility] SERVICES [address] [place], [place], [address] Phone: [phone]   Fax:  [phone]  Physical Therapy Treatment  Patient Details  Name: Felicia Barnett MRN: [id number] Date of Birth: Mar 13, 1966 Referring Provider:  No ref. provider found  Encounter Date: 03/26/2015      PT End of Session - 03/27/15 2302    Visit Number 5   Number of Visits 12   Date for PT Re-Evaluation 05/27/15   PT Start Time 1602   PT Stop Time 1700   PT Time Calculation (min) 58 min   Activity Tolerance Patient tolerated treatment well;No increased pain   Behavior During Therapy [facility] for tasks assessed/performed      Past Medical History  Diagnosis Date  . Hypertension   . Heart murmur     palpatiations  . Hyperlipidemia   . Diabetes mellitus without complication     pre-diabetes  . Anxiety     since surgery, pt has had attacks w/ fear of not being able to move her leg (anxiety attacks occur  3-4x month.)    Past Surgical History  Procedure Laterality Date  . Abdominal hysterectomy    . Cardial ablation      anxiety attack (heart palpitations HR=210 bpm) the day she was released from the hospital from surgery. Findings were neg .    There were no vitals filed for this visit.  Visit Diagnosis:  Myalgia and myositis  Unspecified injury of muscle, fascia and tendon of abdomen, sequela  Impaired mobility      Subjective Assessment - 03/26/15 2010    Subjective Pt continues to perform her HEP. Pt has pain with deep hip flexion on L.    Pertinent History cardiac ablation 2/2 anxiety attack (heart palpitations HR=210 bpm) the day she was released from the hospital. Hx of 2 c-sections, Currently experiencing constipation bowel movement every other day   How long can you stand comfortably? 45 min   Patient Stated Goals feel better, tie shoes, painfree sexual intercourse   Pain Onset Other (comment)  1.51 yo            [facility]  PT Assessment - 03/27/15 2258    Palpation   Palpation comment noted increased fascial restriction along medial border of ASIS, AIIS   post-Tx w/ increased hip flexion ROM/tolerance to palpation                  Pelvic Floor Special Questions - 03/27/15 2257    Exam Type Vaginal   Palpation significantly decreased tenderness compared to previous assessment   Biofeedback d  proper breathing demo'd           [facility] Adult PT Treatment/Exercise - 03/27/15 2301    Exercises   Other Exercises  guided relaxation   Manual Therapy   Myofascial Release myofascial release/STM over R medial border of ASIS, AIIS                PT Education - 03/27/15 2301    Education provided Yes   Education Details reviewed HEP   Person(s) Educated Patient   Methods Explanation;Demonstration;Tactile cues;Verbal cues;Handout   Comprehension Verbalized understanding;Returned demonstration             PT Long Term Goals - 03/24/15 1617    PT LONG TERM GOAL #1   Title Pt will gain increased hip IR on L from 20 deg to 40 deg in order to return to fitness routines.  Time 12   Period Weeks   Status Achieved   PT LONG TERM GOAL #2   Title Pt will demo increased hip ER/abd ( lat tib plateau to plinth from L/27 cm to L < 20 cm) in order to achieve sexual positions without pain.    Time 12   Period Weeks   Status Achieved   PT LONG TERM GOAL #3   Title Pt will be able to tie shoes without pain and with functional range of motion.    Time 12   Period Weeks   Status Achieved   PT LONG TERM GOAL #4   Title Pt will demo proper deep core activation without cuing Dynamic Stabilization 1-4 (5 reps each) in order to minimize relapse of Sx in pelvis and back and perform fitness routines.    Time 12   Period Weeks   Status New   PT LONG TERM GOAL #5   Title Pt will demo pelvic neutral in stance without cuing across 2 sessions.    Time 12   Period Weeks   Status New   Additional  Long Term Goals   Additional Long Term Goals Yes   PT LONG TERM GOAL #6   Title Pt will report no leakage with coughing and no pain with sexual intercourse in order to perform her ADLs.   Time 12   Period Weeks   Status New               Plan - 03/27/15 2302    Clinical Impression Statement Pt shows significantly decreased pelvic floor tensions/tenderness and LQ abdomen/ L groin fascial restriction/ tenderness. Pt shows remaining pain is localized at R  medial aspect of ASIS, AIIS that limit her hip flexion ROM to end range. Post-Tx with fascial release/STM, pt tolerated deep palpation and demo'd increased hip flexion on R. Pt will continue to benefit from skilled PT to increased ROM and  progress to deep core strengthening.    Pt will benefit from skilled therapeutic intervention in order to improve on the following deficits Abnormal gait;Difficulty walking;Increased muscle spasms;Postural dysfunction;Improper body mechanics;Decreased mobility;Decreased activity tolerance;Decreased endurance;Decreased scar mobility;Increased fascial restricitons;Decreased coordination;Pain;Hypomobility;Decreased range of motion;Impaired flexibility;Decreased strength   Rehab Potential Good   Clinical Impairments Affecting Rehab Potential chronicity of pain   PT Frequency 2x / week   PT Duration 12 weeks   PT Treatment/Interventions ADLs/Self Care Home Management;Aquatic Therapy;Stair training;Cryotherapy;Electrical Stimulation;Moist Heat;Traction;Balance training;Therapeutic exercise;Therapeutic activities;Functional mobility training;Manual techniques;Scar mobilization;Dry needling;Patient/family education;Biofeedback;Other (comment);Neuromuscular re-education  pain science education   PT Next Visit Plan internal pelvic re assessment   Consulted and Agree with Plan of Care Patient        Problem List There are no active problems to display for this patient.   Mariane Masters ,PT, DPT,  E-RYT  03/27/2015, 11:06 PM  [facility] MAIN [facility] SERVICES [address] [place], [place], [address] Phone: [phone]   Fax:  [phone]

## 2015-03-31 ENCOUNTER — Encounter: Payer: BLUE CROSS/BLUE SHIELD | Admitting: Physical Therapy

## 2015-04-07 ENCOUNTER — Encounter: Payer: BLUE CROSS/BLUE SHIELD | Admitting: Physical Therapy

## 2015-04-16 ENCOUNTER — Encounter: Payer: BLUE CROSS/BLUE SHIELD | Admitting: Physical Therapy

## 2015-04-17 ENCOUNTER — Encounter: Payer: BLUE CROSS/BLUE SHIELD | Admitting: Physical Therapy

## 2015-04-22 ENCOUNTER — Ambulatory Visit: Payer: BLUE CROSS/BLUE SHIELD | Attending: Student | Admitting: Physical Therapy

## 2015-04-22 DIAGNOSIS — R102 Pelvic and perineal pain: Secondary | ICD-10-CM | POA: Diagnosis not present

## 2015-04-22 DIAGNOSIS — G8929 Other chronic pain: Secondary | ICD-10-CM | POA: Diagnosis present

## 2015-04-22 DIAGNOSIS — S39001S Unspecified injury of muscle, fascia and tendon of abdomen, sequela: Secondary | ICD-10-CM

## 2015-04-22 DIAGNOSIS — IMO0001 Reserved for inherently not codable concepts without codable children: Secondary | ICD-10-CM

## 2015-04-22 DIAGNOSIS — X58XXXS Exposure to other specified factors, sequela: Secondary | ICD-10-CM | POA: Diagnosis not present

## 2015-04-22 DIAGNOSIS — M791 Myalgia: Secondary | ICD-10-CM | POA: Insufficient documentation

## 2015-04-22 DIAGNOSIS — Z7409 Other reduced mobility: Secondary | ICD-10-CM | POA: Diagnosis not present

## 2015-04-22 NOTE — Patient Instructions (Signed)
Heel squeeze in prone with pillow under belly 5 sec, 10x 2 sets / day

## 2015-04-23 NOTE — Therapy (Signed)
[facility] MAIN [facility] SERVICES [address] [place], [place], [address] Phone: [phone]   Fax:  [phone]  Physical Therapy Treatment  Patient Details  Name: Felicia Barnett MRN: [id number] Date of Birth: 1966/09/27 Referring Provider:  Purvis Sheffield, MD  Encounter Date: 04/22/2015      PT End of Session - 04/23/15 2332    Visit Number 6   Number of Visits 12   Date for PT Re-Evaluation 05/27/15   PT Start Time 1505   PT Stop Time 1610   PT Time Calculation (min) 65 min   Activity Tolerance Patient tolerated treatment well;No increased pain   Behavior During Therapy [facility] for tasks assessed/performed      Past Medical History  Diagnosis Date  . Hypertension   . Heart murmur     palpatiations  . Hyperlipidemia   . Diabetes mellitus without complication     pre-diabetes  . Anxiety     since surgery, pt has had attacks w/ fear of not being able to move her leg (anxiety attacks occur  3-4x month.)    Past Surgical History  Procedure Laterality Date  . Abdominal hysterectomy    . Cardial ablation      anxiety attack (heart palpitations HR=210 bpm) the day she was released from the hospital from surgery. Findings were neg .    There were no vitals filed for this visit.  Visit Diagnosis:  Myalgia and myositis  Unspecified injury of muscle, fascia and tendon of abdomen, sequela  Impaired mobility      Subjective Assessment - 04/23/15 2347    Subjective Pt reports having fallen from a chair while standing on it reaching into a high cabinet after the chair flipped. Pt experience excruciating pain and has been treated by her chiropractor and today she feels 40% better. She was informed that her X-ray showed her tailbone had "shifted."  Pt was given three exercises by her D.C. Prior to this fall, pt was able to bend forward to tie shoes and hug L knee to chest (w/ no pain w/sexual intercourse). Currently , due to the fall, pt  feels pain and limitation w/ these two tasks . Prior to the fall, pt had returned to having sexual intercourse without pain.  Pt has had only discomfort in the perineal area throughout the past month. Pt finds it difficult to sit up tall and on hard surfaces.       Pertinent History cardiac ablation 2/2 anxiety attack (heart palpitations HR=210 bpm) the day she was released from the hospital. Hx of 2 c-sections, Currently experiencing constipation bowel movement every other day   Limitations Sitting   How long can you stand comfortably? 45 min   Patient Stated Goals feel better, tie shoes, painfree sexual intercourse   Pain Onset Other (comment)  1.51 yo            [facility] PT Assessment - 04/23/15 2309    Sensation   Light Touch --  L medial thigh,posterior perineal,increased sensitivity > R   Other:   Other/ Comments postTx: able to reach over to don slip on shoe, and bring L knee to chest with increased range than pre-Tx   Flexibility   Soft Tissue Assessment /Muscle Length --  pre/post-Tx:L hip flex 90/100 deg.Hip ER: 35/25 cm (R 18 cm                  Pelvic Floor Special Questions - 04/23/15 2336  Pelvic Floor Internal Exam pt consented without contraindications noted   Exam Type Vaginal   Palpation no pelvic floor tensions/tenderness bilaterally noted           [facility] Adult PT Treatment/Exercise - 04/23/15 2325    Exercises   Other Exercises  heel press in prone w/ pillow under hips 5 sec x 5    Manual Therapy   Manual therapy comments Noted coccyx deviated to L, more medial post-Tx    Joint Mobilization sacral mob inferior/superior Grade II  distraction of LLE, PA mobs Grade II along lateral L sacrum   Soft tissue mobilization coccygeus/ sacropsinous L    Myofascial Release coccygeus/ sacropsinous L                PT Education - 04/23/15 2347    Education provided Yes   Education Details HEP   Person(s) Educated Patient   Methods  Explanation;Demonstration;Tactile cues;Verbal cues;Handout   Comprehension Verbalized understanding;Returned demonstration             PT Long Term Goals - 04/23/15 2340    PT LONG TERM GOAL #1   Title Pt will gain increased hip IR on L from 20 deg to 40 deg in order to return to fitness routines.    Time 12   Period Weeks   Status Achieved   PT LONG TERM GOAL #2   Title Pt will demo increased hip ER/abd ( lat tib plateau to plinth from L/27 cm to L < 20 cm) in order to achieve sexual positions without pain.    Time 12   Period Weeks   Status Achieved   PT LONG TERM GOAL #3   Title Pt will be able to tie shoes without pain and with functional range of motion.    Time 12   Period Weeks   Status Achieved   PT LONG TERM GOAL #4   Title Pt will demo proper deep core activation without cuing Dynamic Stabilization 1-4 (5 reps each) in order to minimize relapse of Sx in pelvis and back and perform fitness routines.    Time 12   Period Weeks   Status New   PT LONG TERM GOAL #5   Title Pt will report being able to tolerate sitting on hard surfaced chairs for > 30 min without shifting weight off ishial tuberosities.    Time 12   Period Weeks   Status Revised   Additional Long Term Goals   Additional Long Term Goals Yes   PT LONG TERM GOAL #6   Title Pt will report no leakage with coughing and no pain with sexual intercourse in order to perform her ADLs.   Time 12   Period Weeks   Status New   PT LONG TERM GOAL #7   Title Pt will demo increased hip ER/abd ( lat tib plateau to plinth from L/25 cm to L  18 cm) and hip flexion from 90deg to 100 deg in order to achieve sexual positions without pain and to tie her shoe.    Time 12   Period Weeks   Status New               Plan - 04/23/15 2334    Clinical Impression Statement Pt presents today with tailbone pain 2/2 fall two weeks ago with evidence of L deviation of coccyx through external palpation. Pt also showed increased  coccygeus mm/sacropinous tensions. Post-manual Tx, pt showed more medial aligned coccyx with decreased tenderness and  tensions in surrounding area along with increased ROM w/L  hip flexion/ hip ER/abd. Pt demo'd today through internal vaginal assessment no pelvic floor tensions/ tenderness. Withholding pelvic floor strengthening to address SUI goal until tensions/tenderness in pelvic area have diminished and pt is able to return to sitting.  Pt will continue to require skilled PT in order to achieve her goals and remaining complaint of hypersensitivity to touch along medial L thigh compared to R.     Pt will benefit from skilled therapeutic intervention in order to improve on the following deficits Abnormal gait;Difficulty walking;Increased muscle spasms;Postural dysfunction;Improper body mechanics;Decreased mobility;Decreased activity tolerance;Decreased endurance;Decreased scar mobility;Increased fascial restricitons;Decreased coordination;Pain;Hypomobility;Decreased range of motion;Impaired flexibility;Decreased strength   Rehab Potential Good   Clinical Impairments Affecting Rehab Potential chronicity of pain, recent fall   PT Frequency 2x / week   PT Duration 12 weeks   PT Treatment/Interventions ADLs/Self Care Home Management;Aquatic Therapy;Stair training;Cryotherapy;Electrical Stimulation;Moist Heat;Traction;Balance training;Therapeutic exercise;Therapeutic activities;Functional mobility training;Manual techniques;Scar mobilization;Dry needling;Patient/family education;Biofeedback;Other (comment);Neuromuscular re-education   Consulted and Agree with Plan of Care Patient        Problem List There are no active problems to display for this patient.   Mariane MastersYeung,Shin Yiing ,PT, DPT, E-RYT  04/23/2015, 11:57 PM  [place] [facility] [address] [place], [place], [id number] Phone: [phone]   Fax:  [phone]

## 2015-04-28 ENCOUNTER — Encounter: Payer: BLUE CROSS/BLUE SHIELD | Admitting: Physical Therapy

## 2015-04-29 ENCOUNTER — Encounter: Payer: BLUE CROSS/BLUE SHIELD | Admitting: Physical Therapy

## 2015-05-05 ENCOUNTER — Ambulatory Visit: Payer: BLUE CROSS/BLUE SHIELD | Admitting: Physical Therapy

## 2015-05-05 DIAGNOSIS — R102 Pelvic and perineal pain: Secondary | ICD-10-CM | POA: Diagnosis not present

## 2015-05-05 DIAGNOSIS — IMO0001 Reserved for inherently not codable concepts without codable children: Secondary | ICD-10-CM

## 2015-05-05 DIAGNOSIS — S39001S Unspecified injury of muscle, fascia and tendon of abdomen, sequela: Secondary | ICD-10-CM

## 2015-05-05 DIAGNOSIS — Z7409 Other reduced mobility: Secondary | ICD-10-CM

## 2015-05-06 NOTE — Therapy (Signed)
[place] [facility] MAIN [facility] SERVICES [address] [place], [place], [address] Phone: [phone]   Fax:  [phone]  Physical Therapy Treatment  Patient Details  Name: Felicia Barnett MRN: [id number] Date of Birth: [birth year] Referring Provider:  Purvis Sheffield, MD  Encounter Date: 05/05/2015      PT End of Session - 05/06/15 1713    Visit Number 7   Number of Visits 12   Date for PT Re-Evaluation 05/27/15   PT Start Time 1604   PT Stop Time 1705   PT Time Calculation (min) 61 min   Activity Tolerance Patient tolerated treatment well;No increased pain   Behavior During Therapy [facility] for tasks assessed/performed      Past Medical History  Diagnosis Date  . Hypertension   . Heart murmur     palpatiations  . Hyperlipidemia   . Diabetes mellitus without complication     pre-diabetes  . Anxiety     since surgery, pt has had attacks w/ fear of not being able to move her leg (anxiety attacks occur  3-4x month.)    Past Surgical History  Procedure Laterality Date  . Abdominal hysterectomy    . Cardial ablation      anxiety attack (heart palpitations HR=210 bpm) the day she was released from the hospital from surgery. Findings were neg .    There were no vitals filed for this visit.  Visit Diagnosis:  Myalgia and myositis  Impaired mobility  Unspecified injury of muscle, fascia and tendon of abdomen, sequela      Subjective Assessment - 05/05/15 1609    Subjective (p) Pt has returned after 1 week and feel she her tailbone pain is 3/10 and able sit ffor longer periods of time. Pt is concerned that she feels tingling along her L medial thigh which used to be closer to the groin but now extends down from mid thigh to knee. Pt also is concerned about her abdominal bloatedness and lower R flank pain. Pt will call her MD this week. Pt states her MD had performed kidney tests which came back negative. Pt denied nausea, bleeding, fever. Pt  reported night time sweats at around 5am.     Pertinent History (p) cardiac ablation 2/2 anxiety attack (heart palpitations HR=210 bpm) the day she was released from the hospital. Hx of 2 c-sections, Currently experiencing constipation bowel movement every other day   Limitations (p) Sitting   How long can you stand comfortably? (p) 45 min   Patient Stated Goals (p) feel better, tie shoes, painfree sexual intercourse   Pain Onset (p) Other (comment)  1.51 yo            [facility] PT Assessment - 05/06/15 1703    Sensation   Light Touch --  L < R  at L2/L3     Other:   Other/ Comments figure 4 ankle over thigh to don shoes: pre-Tx compensation w. back lean/pain noted. Post-Tx: less difficulty and WFL    AROM   Overall AROM Comments L hip flex 90 deg limited by pain   PROM   Overall PROM Comments L hip flex ~100 deg limited by pain  hip flex/ER increased post-Tx   Palpation   Palpation comment noted increased tension/tenderness L deep tranverse perineal mm, pubic rami aponeurosis  decreased tenderness/tensions post TX                     [facility] Adult PT Treatment/Exercise -  05/06/15 1708    Therapeutic Activites    Therapeutic Activities --  deep squat 5 reps cued for wider feet placement   Exercises   Other Exercises  opp arm, tandem stance (hip flexor stretch), adductor stretch standing before point of pain   Manual Therapy   Joint Mobilization distraction of LLE   Soft tissue mobilization L suprapubic, adductor    Myofascial Release deep transverse perineal L  in supine  pubic aponeurosis                PT Education - 05/06/15 1712    Education provided Yes   Education Details HEP, PT faciliated phone call to [facility] and pt was able to schedule a MD appt for tomorrow re: her flank pain   Person(s) Educated Patient   Methods Explanation;Demonstration;Tactile cues;Verbal cues   Comprehension Verbalized understanding;Returned  demonstration             PT Long Term Goals - 05/06/15 1716    PT LONG TERM GOAL #1   Title Pt will gain increased hip IR on L from 20 deg to 40 deg in order to return to fitness routines.    Time 12   Period Weeks   Status Achieved   PT LONG TERM GOAL #2   Title Pt will demo increased hip ER/abd ( lat tib plateau to plinth from L/27 cm to L < 20 cm) in order to achieve sexual positions without pain.    Time 12   Period Weeks   Status Achieved   PT LONG TERM GOAL #3   Title Pt will be able to tie shoes without pain and with functional range of motion.    Time 12   Period Weeks   Status Achieved   PT LONG TERM GOAL #4   Title Pt will demo proper deep core activation without cuing Dynamic Stabilization 1-4 (5 reps each) in order to minimize relapse of Sx in pelvis and back and perform fitness routines.    Time 12   Period Weeks   Status On-going   PT LONG TERM GOAL #5   Title Pt will report being able to tolerate sitting on hard surfaced chairs for > 30 min without shifting weight off ishial tuberosities.    Time 12   Period Weeks   Status Achieved   PT LONG TERM GOAL #6   Title Pt will report no leakage with coughing and no pain with sexual intercourse in order to perform her ADLs.   Time 12   Period Weeks   Status New   PT LONG TERM GOAL #7   Title Pt will demo increased hip ER/abd ( lat tib plateau to plinth from L/25 cm to L  18 cm) and hip flexion from 90deg to 100 deg in order to achieve sexual positions without pain and to tie her shoe.    Time 12   Period Weeks   Status New               Plan - 05/06/15 1714    Clinical Impression Statement Pt showed ability to sit without difficulty/pain on tailbone. Pt showed increased hip AROM/PROM post-Tx with manual Tx and was able to don shoes using ankle crossed over opposite thigh with less pain and deviations in addition to deep squat. Pt will continue to benefit from skilled PT. Continue to f/u w/ pt's flank  pain status after her pt w/ her MD.  Plan to address sensory  deficit at future sessions.    Pt will benefit from skilled therapeutic intervention in order to improve on the following deficits Abnormal gait;Difficulty walking;Increased muscle spasms;Postural dysfunction;Improper body mechanics;Decreased mobility;Decreased activity tolerance;Decreased endurance;Decreased scar mobility;Increased fascial restricitons;Decreased coordination;Pain;Hypomobility;Decreased range of motion;Impaired flexibility;Decreased strength   Rehab Potential Good   Clinical Impairments Affecting Rehab Potential chronicity of pain, recent fall   PT Frequency 2x / week   PT Duration 12 weeks   PT Treatment/Interventions ADLs/Self Care Home Management;Aquatic Therapy;Stair training;Cryotherapy;Electrical Stimulation;Moist Heat;Traction;Balance training;Therapeutic exercise;Therapeutic activities;Functional mobility training;Manual techniques;Scar mobilization;Dry needling;Patient/family education;Biofeedback;Other (comment);Neuromuscular re-education   Consulted and Agree with Plan of Care Patient        Problem List There are no active problems to display for this patient.   Mariane Masters ,PT, DPT, E-RYT  05/06/2015, 5:17 PM  [place] [facility] MAIN [facility] SERVICES [address] [place], [place], [address] Phone: [phone]   Fax:  [phone]

## 2015-05-06 NOTE — Patient Instructions (Signed)
Hip flexor stretch standing w/ semi tandem and opp arm over head  Adductor stretch before point of pain (standing)  Deep squat 5 x

## 2015-05-11 ENCOUNTER — Encounter: Payer: BLUE CROSS/BLUE SHIELD | Admitting: Physical Therapy

## 2015-05-14 ENCOUNTER — Ambulatory Visit: Payer: Managed Care, Other (non HMO) | Admitting: Physical Therapy

## 2015-05-18 ENCOUNTER — Ambulatory Visit: Payer: Managed Care, Other (non HMO) | Attending: Student | Admitting: Physical Therapy

## 2015-05-18 VITALS — BP 110/68

## 2015-05-18 DIAGNOSIS — Z7409 Other reduced mobility: Secondary | ICD-10-CM

## 2015-05-18 DIAGNOSIS — S39001S Unspecified injury of muscle, fascia and tendon of abdomen, sequela: Secondary | ICD-10-CM | POA: Diagnosis present

## 2015-05-18 DIAGNOSIS — M609 Myositis, unspecified: Secondary | ICD-10-CM | POA: Insufficient documentation

## 2015-05-18 DIAGNOSIS — M791 Myalgia: Secondary | ICD-10-CM | POA: Insufficient documentation

## 2015-05-18 DIAGNOSIS — IMO0001 Reserved for inherently not codable concepts without codable children: Secondary | ICD-10-CM

## 2015-05-19 NOTE — Therapy (Signed)
[place] [facility] MAIN [facility] SERVICES [address] [place], [place], [address] Phone: [phone]   Fax:  [phone]  Physical Therapy Treatment  Patient Details  Name: Felicia Barnett MRN: [id number] Date of Birth: 02/12/1966 Referring Provider:  Purvis Sheffield, MD  Encounter Date: 05/18/2015      PT End of Session - 05/19/15 1606    Visit Number 8   Number of Visits 12   Date for PT Re-Evaluation 05/27/15   PT Start Time 1605   PT Stop Time 1715   PT Time Calculation (min) 70 min   Activity Tolerance Patient tolerated treatment well;No increased pain   Behavior During Therapy [facility] for tasks assessed/performed      Past Medical History  Diagnosis Date  . Hypertension   . Heart murmur     palpatiations  . Hyperlipidemia   . Diabetes mellitus without complication     pre-diabetes  . Anxiety     since surgery, pt has had attacks w/ fear of not being able to move her leg (anxiety attacks occur  3-4x month.)    Past Surgical History  Procedure Laterality Date  . Abdominal hysterectomy    . Cardial ablation      anxiety attack (heart palpitations HR=210 bpm) the day she was released from the hospital from surgery. Findings were neg .    Filed Vitals:   05/18/15 1614  BP: 110/68    Visit Diagnosis:  Myalgia and myositis  Impaired mobility  Unspecified injury of muscle, fascia and tendon of abdomen, sequela      Subjective Assessment - 05/19/15 1558    Subjective Pt reported she still feels pain w/ numbness in L medial thigh and still have trouble bending forward, tying shoes and opening hips for sexual intercourse. Pt is interested in following up with her referring MD re:  L cyst.  Since last session 3 weeks ago, pt was able to maintain her ROM with tying shoes for about 4 days.Pt reported she saw her DC who did an adjustment on her neck and since then, she has had difficulty extending her L wrist with pain radiating down from  her posterior shoulders to her wrist.       Pertinent History cardiac ablation 2/2 anxiety attack (heart palpitations HR=210 bpm) the day she was released from the hospital. Hx of 2 c-sections, Currently experiencing constipation bowel movement every other day   How long can you stand comfortably? 45 min   Patient Stated Goals feel better, tie shoes, painfree sexual intercourse            [facility] PT Assessment - 05/19/15 1539    Sensation   Light Touch --  L2 bilat, L3 decreased on L > R post-Tx   Squat   Comments achieved deep squat without pain   w/ proper alignment   ROM / Strength   AROM / PROM / Strength --  Gross UE MMT: R 4-/5, L5/5   AROM   Overall AROM Comments achieved full hip flexion/ER bilateral active and passive post_Tx   Palpation   SI assessment  coccyx more medially aligned, increased tensions along sacrospinous/ coccygeus L, decreased tenderness and tensions post-Tx    Palpation comment significantly increased fascial restrictions along L medial thigh, decreased post-Tx                  Pelvic Floor Special Questions - 05/19/15 1552    Pelvic Floor Internal Exam pt consented without  contraindications noted   Exam Type Vaginal   Palpation no pelvic floor tensions/tenderness bilaterally noted           [facility] Adult PT Treatment/Exercise - 05/19/15 1601    Therapeutic Activites    Therapeutic Activities --  modifications to sexual positions w/ pillow and sheets   Neuro Re-ed    Neuro Re-ed Details  pelvic floor ROM with breathing, quick squeezes, relaxation of pelvic floor mm prior, during, and after intercourse  10 reps    Exercises   Other Exercises  --  deep squat from standing, supine hip flex/ER stretch   Manual Therapy   Soft tissue mobilization coccygeus/ sacropsinous L    Myofascial Release coccygeus/ sacropsinous L  L medial thigh                PT Education - 05/19/15 1605    Education provided Yes   Education Details  HEP, POC, pain science education to continue to practice full hip ROM, plan to continue addressing L medial thigh numbness at future visits, continue to monitor R UE Sx   Person(s) Educated Patient   Methods Explanation;Demonstration;Tactile cues;Verbal cues;Handout   Comprehension Verbalized understanding;Returned demonstration             PT Long Term Goals - 05/19/15 1533    PT LONG TERM GOAL #1   Title Pt will gain increased hip IR on L from 20 deg to 40 deg in order to return to fitness routines.    Time 12   Period Weeks   Status Achieved   PT LONG TERM GOAL #2   Title Pt will demo increased hip ER/abd ( lat tib plateau to plinth from L/27 cm to L < 20 cm) in order to achieve sexual positions without pain.    Time 12   Period Weeks   Status Achieved   PT LONG TERM GOAL #3   Title Pt will be able to tie shoes without pain and with functional range of motion.    Time 12   Period Weeks   Status Achieved   PT LONG TERM GOAL #4   Title Pt will demo proper deep core activation without cuing Dynamic Stabilization 1-4 (5 reps each) in order to minimize relapse of Sx in pelvis and back and perform fitness routines.    Time 12   Period Weeks   Status On-going   PT LONG TERM GOAL #5   Title Pt will report being able to tolerate sitting on hard surfaced chairs for > 30 min without shifting weight off ishial tuberosities.    Time 12   Period Weeks   Status Achieved   Additional Long Term Goals   Additional Long Term Goals Yes   PT LONG TERM GOAL #6   Title Pt will report no leakage with coughing and no pain with sexual intercourse in order to perform her ADLs.   Time 12   Period Weeks   Status New   PT LONG TERM GOAL #7   Title Pt will demo increased hip ER/abd ( lat tib plateau to plinth from L/25 cm to L 18 cm) and hip flexion from 90deg to 100 deg in order to achieve sexual positions without pain and to tie her shoe.    Time 12   Period Weeks   Status Achieved   PT LONG  TERM GOAL #8   Title Pt will report an increase in improvement with numbness sensation on her L medial thigh from 40%  to > 60% in order to improve QOL.    Time 12   Period Weeks   Status New               Plan - 05/19/15 1607    Clinical Impression Statement Pt continues to recover from the relapse of Sx from a fall she sustained on her tailbone last month. Today, pt achieved full AROM/ PROM hip flex/ER and ability to perform deep squat from standing. Pt has shown no pelvic floor tensions nor tenderness through internal assessment for the past two visits (3 weeks apart). Pt also shows a more medially aligned coccyx w/ decreased tensions/ tenderness located at L coccygeus/ sacrospinous area post-Tx today. Pt responded well to fascial releases on L medial thigh with a report of 40% improvement with numbness sensation and regained equal sensation along L2 dermatome.  Pt was educated on modifications to sexual position, pelvic propioception, and pain science education.  Anticipate pt will continue to progress towards her goals.  PT performed a qross screen on her R UE which showed decreased strength compared to L. Pt was educated to continue monitoring UE Sx and PT will contact MD for a referral to treat if Sx do not subside.    Pt will benefit from skilled therapeutic intervention in order to improve on the following deficits Abnormal gait;Difficulty walking;Increased muscle spasms;Postural dysfunction;Improper body mechanics;Decreased mobility;Decreased activity tolerance;Decreased endurance;Decreased scar mobility;Increased fascial restricitons;Decreased coordination;Pain;Hypomobility;Decreased range of motion;Impaired flexibility;Decreased strength   Rehab Potential Good   Clinical Impairments Affecting Rehab Potential chronicity of pain, recent fall   PT Frequency 2x / week   PT Duration 12 weeks   PT Treatment/Interventions ADLs/Self Care Home Management;Aquatic Therapy;Stair  training;Cryotherapy;Electrical Stimulation;Moist Heat;Traction;Balance training;Therapeutic exercise;Therapeutic activities;Functional mobility training;Manual techniques;Scar mobilization;Dry needling;Patient/family education;Biofeedback;Other (comment);Neuromuscular re-education   PT Next Visit Plan fascial releases L medial thigh   Consulted and Agree with Plan of Care Patient        Problem List There are no active problems to display for this patient.   Elisha Ponder, DPT, E-RYT  05/19/2015, 4:16 PM   [facility] MAIN [facility] SERVICES [address] [place], [place], [address] Phone: [phone]   Fax:  [phone]

## 2015-05-19 NOTE — Patient Instructions (Addendum)
Deep squat 5 x/day Hip flexion/ER stretch with sheet  10-20 reps/day Position modifications with pillows/sheets for ease during sexual intercourse Pelvic floor ROM with breathing Fascial massage over L medial thigh

## 2015-05-25 ENCOUNTER — Encounter: Payer: BLUE CROSS/BLUE SHIELD | Admitting: Physical Therapy

## 2015-05-26 ENCOUNTER — Ambulatory Visit: Payer: Managed Care, Other (non HMO) | Admitting: Physical Therapy

## 2015-05-26 DIAGNOSIS — Z7409 Other reduced mobility: Secondary | ICD-10-CM

## 2015-05-26 DIAGNOSIS — M791 Myalgia: Secondary | ICD-10-CM | POA: Diagnosis not present

## 2015-05-26 DIAGNOSIS — IMO0001 Reserved for inherently not codable concepts without codable children: Secondary | ICD-10-CM

## 2015-05-26 DIAGNOSIS — S39001S Unspecified injury of muscle, fascia and tendon of abdomen, sequela: Secondary | ICD-10-CM

## 2015-05-26 NOTE — Patient Instructions (Addendum)
Marjo Bicker pose 10 reps and prone on 3 stacked pillows to relief mm tensions lateral to coccyx  Wear shoes with good sole support when walking

## 2015-05-27 NOTE — Therapy (Addendum)
[place] [facility] MAIN [facility] SERVICES [address] [place], [place], [address] Phone: [phone]   Fax:  [phone]  Physical Therapy Treatment  Patient Details  Name: Felicia Barnett MRN: [id number] Date of Birth: 07/17/1966 Referring Provider:  Purvis Sheffield, MD  Encounter Date: 05/26/2015      PT End of Session - 05/27/15 1650    Visit Number 9   Number of Visits 12   Date for PT Re-Evaluation 05/27/15   PT Start Time 1610   PT Stop Time 1710   PT Time Calculation (min) 60 min   Activity Tolerance Patient tolerated treatment well;No increased pain   Behavior During Therapy [facility] for tasks assessed/performed      Past Medical History  Diagnosis Date  . Hypertension   . Heart murmur     palpatiations  . Hyperlipidemia   . Diabetes mellitus without complication     pre-diabetes  . Anxiety     since surgery, pt has had attacks w/ fear of not being able to move her leg (anxiety attacks occur  3-4x month.)    Past Surgical History  Procedure Laterality Date  . Abdominal hysterectomy    . Cardial ablation      anxiety attack (heart palpitations HR=210 bpm) the day she was released from the hospital from surgery. Findings were neg .    There were no vitals filed for this visit.  Visit Diagnosis:  Myalgia and myositis - Plan: PT plan of care cert/re-cert  Impaired mobility - Plan: PT plan of care cert/re-cert  Unspecified injury of muscle, fascia and tendon of abdomen, sequela - Plan: PT plan of care cert/re-cert      Subjective Assessment - 05/26/15 1625    Subjective Pt reported she was able to tolerate sexual intercourse without pain. Pt no longer feels numbness along the proximal medial thigh area (L1)  but it does still persist along the distal medial thigh L2-3).  Pt is concerned with two episodes of suprapubic pain where she was awaken in to the middle of the night (described as a "cramping 9/10)  and tried to void and  only urinated droplets. This occured 1 week ago and then eased off 5 days ago.  Pt reported having night sweats for 4 months but no fever, chills, no sudden weight loss. Then three days ago, pt started feeling radiating pain from tailbone to R lateral LE  to pinky toe without relief  8/10 pain. Pt reported taking Ibuprofen. Also with further questioning, pt reported the day prior to the radicular pain, pt stated she walked long periods in downtown in [place] in [place] shoes.       Pertinent History cardiac ablation 2/2 anxiety attack (heart palpitations HR=210 bpm) the day she was released from the hospital. Hx of 2 c-sections, Currently experiencing constipation bowel movement every other day   Patient Stated Goals feel better, tie shoes, painfree sexual intercourse   Currently in Pain? Yes   Pain Score 7    Pain Location Back   Pain Radiating Towards R lefg to R pinky toe            [facility] PT Assessment - 05/27/15 1648    Other:   Other/ Comments Repeated movements sidebend R : radicular Sx moved proximally from pinky toe to knee level 30 reps.    Palpation   SI assessment  increased R tensions mm lateral to coccyx,and PSIS   post-Tx decreased tension/tenderness,regained full fwd flex  [facility] Adult PT Treatment/Exercise - 05/27/15 1648    Exercises   Other Exercises  repeated side flexion R  30 reps   child pose 10 reps to stretch coccygeus   Manual Therapy   Joint Mobilization Rotational mobilizatoin Grade III bouts of 90 sec 3 sets, MWM with hip flexion R     Soft tissue mobilization sustained pressure, jostling R PSIS and R occygeus lateral to coccyx                  PT Education - 05/27/15 1649    Education provided Yes   Education Details HEP   Person(s) Educated Patient   Methods Explanation;Demonstration;Tactile cues;Verbal cues   Comprehension Verbalized understanding;Returned demonstration             PT Long Term Goals -  05/26/15 1636    PT LONG TERM GOAL #1   Title Pt will gain increased hip IR on L from 20 deg to 40 deg in order to return to fitness routines.    Time 12   Period Weeks   Status Achieved   PT LONG TERM GOAL #2   Title Pt will demo increased hip ER/abd ( lat tib plateau to plinth from L/27 cm to L < 20 cm) in order to achieve sexual positions without pain.    Time 12   Period Weeks   Status Achieved   PT LONG TERM GOAL #3   Title Pt will be able to tie shoes without pain and with functional range of motion.    Time 12   Period Weeks   Status Achieved   PT LONG TERM GOAL #4   Title Pt will demo proper deep core activation without cuing Dynamic Stabilization 1-4 (5 reps each) in order to minimize relapse of Sx in pelvis and back and perform fitness routines.    Time 12   Period Weeks   Status On-going   PT LONG TERM GOAL #5   Title Pt will report being able to tolerate sitting on hard surfaced chairs for > 30 min without shifting weight off ishial tuberosities.    Time 12   Period Weeks   Status Achieved   PT LONG TERM GOAL #6   Title Pt will report no leakage with coughing and no pain with sexual intercourse in order to perform her ADLs.   Time 12   Period Weeks   Status Achieved   PT LONG TERM GOAL #7   Title Pt will demo increased hip ER/abd ( lat tib plateau to plinth from L/25 cm to L 18 cm) and hip flexion from 90deg to 100 deg in order to achieve sexual positions without pain and to tie her shoe.    Time 12   Period Weeks   Status Achieved   PT LONG TERM GOAL #8   Title Pt will report an increase in improvement with numbness sensation on her L medial thigh from 40% to > 60% in order to improve QOL.    Time 12   Period Weeks   Status On-going               Plan - 05/27/15 1652    Clinical Impression Statement Pt has achieved 6/8 goals across the past 9 visits which include regaining full hip ROM to tie shoes and tolerate sexual positions without pain. Pt no  longer presents with pelvic floor mmt tensions through internal vaginal assessment and can coordinate pelvic floor ROM. Despite a relapse of  Sx a month ago from a fall onto her tailbone, pt's Sx  continue to decrease as she responds well to manual Tx, theraupeutic exercise, and neuromuscular re-edu and pain science education. Pt 's remaining deficits include:  1)  slight posterior pelvic floor mm tensions near the coccyx which decreased significantly today as she regained spinal forward flexion ROM and 2) numbness/tingling along cutaneous dermatome of L2-3 along medial thigh (L1 region was addressed at last session w/ manual Tx and has resolved). Plan is to address these remaining deficits, progress her towards deep core strengthening and towards her remaining goals with this re-certification. Pt c/o two episodes of abdominal cramping which had interrupted her sleep but reported she has not had fevers, chills, unintentional weightloss. Pt did report she has had night sweats for the past months as she thought it had to do with her menopausal Sx.PT has left a voicemail message on Dr. Randel Pigg nurses 's line re: these Sx.       Pt will benefit from skilled therapeutic intervention in order to improve on the following deficits Abnormal gait;Difficulty walking;Increased muscle spasms;Postural dysfunction;Improper body mechanics;Decreased mobility;Decreased activity tolerance;Decreased endurance;Decreased scar mobility;Increased fascial restricitons;Decreased coordination;Pain;Hypomobility;Decreased range of motion;Impaired flexibility;Decreased strength   Rehab Potential Good   Clinical Impairments Affecting Rehab Potential chronicity of pain, recent fall   PT Frequency 2x / week   PT Duration 12 weeks   PT Treatment/Interventions ADLs/Self Care Home Management;Aquatic Therapy;Stair training;Cryotherapy;Electrical Stimulation;Moist Heat;Traction;Balance training;Therapeutic exercise;Therapeutic  activities;Functional mobility training;Manual techniques;Scar mobilization;Dry needling;Patient/family education;Biofeedback;Other (comment);Neuromuscular re-education   PT Next Visit Plan fascial releases L medial thigh   Consulted and Agree with Plan of Care Patient        Problem List There are no active problems to display for this patient.   Mariane Masters ,PT, DPT, E-RYT  05/27/2015, 5:22 PM  [place] [facility] MAIN [facility] SERVICES [address] [place], [place], [address] Phone: [phone]   Fax:  [phone]

## 2015-06-02 ENCOUNTER — Ambulatory Visit: Payer: Managed Care, Other (non HMO) | Admitting: Physical Therapy

## 2015-06-05 ENCOUNTER — Ambulatory Visit: Payer: Managed Care, Other (non HMO) | Admitting: Physical Therapy

## 2015-06-05 DIAGNOSIS — IMO0001 Reserved for inherently not codable concepts without codable children: Secondary | ICD-10-CM

## 2015-06-05 DIAGNOSIS — Z7409 Other reduced mobility: Secondary | ICD-10-CM

## 2015-06-05 DIAGNOSIS — M791 Myalgia: Secondary | ICD-10-CM | POA: Diagnosis not present

## 2015-06-05 DIAGNOSIS — S39001S Unspecified injury of muscle, fascia and tendon of abdomen, sequela: Secondary | ICD-10-CM

## 2015-06-05 NOTE — Therapy (Addendum)
[place] [facility] MAIN [facility] SERVICES [address] [place], [place], [address] Phone: [phone]   Fax:  [phone]  Physical Therapy Treatment  Patient Details  Name: Felicia Barnett MRN: [id number] Date of Birth: [birth year] Referring Provider:  Purvis Sheffield, MD  Encounter Date: 06/05/2015      PT End of Session - 06/11/15 1611    Visit Number 10   Number of Visits 16   Date for PT Re-Evaluation 07/10/15   Activity Tolerance Patient tolerated treatment well;No increased pain   Behavior During Therapy [facility] for tasks assessed/performed      Past Medical History  Diagnosis Date  . Hypertension   . Heart murmur     palpatiations  . Hyperlipidemia   . Diabetes mellitus without complication     pre-diabetes  . Anxiety     since surgery, pt has had attacks w/ fear of not being able to move her leg (anxiety attacks occur  3-4x month.)    Past Surgical History  Procedure Laterality Date  . Abdominal hysterectomy    . Cardial ablation      anxiety attack (heart palpitations HR=210 bpm) the day she was released from the hospital from surgery. Findings were neg .    There were no vitals filed for this visit.  Visit Diagnosis:  Myalgia and myositis  Unspecified injury of muscle, fascia and tendon of abdomen, sequela  Impaired mobility      Subjective Assessment - 06/05/15 1608    Subjective Pt reported 5 days ago, she experienced  a pain in her L knee w/ swelling and could not bend it. The swelling went down after 3 days. Today, she has no pain nor swelling in her R knee. Her concern is that the numbness/ tingling in the lower anterior/medial (L2-3) continues to persist. Pt also reports when something touches it, it feels cold.  Pt also reported she has 2-3/10 pain in her R glut and coccyx that comes on after standing for 20 min (doing dishes), during the day at work.  Pt also reports some R ear issues which she tried to get an appt at  [facility] this morning but was unsuccessful. PT recommended the Urgent Care at [facility]. and pt plans to go after the appt.      Pertinent History cardiac ablation 2/2 anxiety attack (heart palpitations HR=210 bpm) the day she was released from the hospital. Hx of 2 c-sections, Currently experiencing constipation bowel movement every other day   How long can you stand comfortably? 45 min   Patient Stated Goals feel better, tie shoes, painfree sexual intercourse            [facility] PT Assessment - 06/11/15 1610    Strength   Overall Strength Comments 4/5 bil LE    Palpation   SI assessment  very minor deviation of coccyx to R, slight mm tenderness/tensions along coccygeus on L   decreased post_Tx, coccyx medially aligned   Palpation comment significant fascial restriction along medial thigh L   significantly decreased post-Tx                     [facility] Adult PT Treatment/Exercise - 06/05/15 1609    Self-Care   Self-Care --  body scan 2 x. pt reported feeling relaxed   Exercises   Other Exercises  frog, L extended angle yoga pose, figure 4 stretches bil. 5 reps    Manual Therapy  Soft tissue mobilization sustatined pressure along L coccygeus mm L     Myofascial Release fascial releases along L medial thigh                PT Education - 06/11/15 1611    Education provided Yes   Education Details HEP   Person(s) Educated Patient   Methods Explanation;Demonstration;Tactile cues;Verbal cues   Comprehension Verbalized understanding;Returned demonstration             PT Long Term Goals - 05/26/15 1636    PT LONG TERM GOAL #1   Title Pt will gain increased hip IR on L from 20 deg to 40 deg in order to return to fitness routines.    Time 12   Period Weeks   Status Achieved   PT LONG TERM GOAL #2   Title Pt will demo increased hip ER/abd ( lat tib plateau to plinth from L/27 cm to L < 20 cm) in order to achieve sexual positions  without pain.    Time 12   Period Weeks   Status Achieved   PT LONG TERM GOAL #3   Title Pt will be able to tie shoes without pain and with functional range of motion.    Time 12   Period Weeks   Status Achieved   PT LONG TERM GOAL #4   Title Pt will demo proper deep core activation without cuing Dynamic Stabilization 1-4 (5 reps each) in order to minimize relapse of Sx in pelvis and back and perform fitness routines.    Time 12   Period Weeks   Status On-going   PT LONG TERM GOAL #5   Title Pt will report being able to tolerate sitting on hard surfaced chairs for > 30 min without shifting weight off ishial tuberosities.    Time 12   Period Weeks   Status Achieved   PT LONG TERM GOAL #6   Title Pt will report no leakage with coughing and no pain with sexual intercourse in order to perform her ADLs.   Time 12   Period Weeks   Status Achieved   PT LONG TERM GOAL #7   Title Pt will demo increased hip ER/abd ( lat tib plateau to plinth from L/25 cm to L 18 cm) and hip flexion from 90deg to 100 deg in order to achieve sexual positions without pain and to tie her shoe.    Time 12   Period Weeks   Status Achieved   PT LONG TERM GOAL #8   Title Pt will report an increase in improvement with numbness sensation on her L medial thigh from 40% to > 60% in order to improve QOL.    Time 12   Period Weeks   Status On-going               Plan - 06/05/15 1612    Clinical Impression Statement Pt reported her numbness and tingling improved by 60% post-Tx. Pt will continue to benefit from skilled PT to continue addressing decreased fascial mobility along L medial thigh and to progress towards deep core strengthening,.   Pt will benefit from skilled therapeutic intervention in order to improve on the following deficits Abnormal gait;Difficulty walking;Increased muscle spasms;Postural dysfunction;Improper body mechanics;Decreased mobility;Decreased activity tolerance;Decreased  endurance;Decreased scar mobility;Increased fascial restricitons;Decreased coordination;Pain;Hypomobility;Decreased range of motion;Impaired flexibility;Decreased strength   Rehab Potential Good   Clinical Impairments Affecting Rehab Potential chronicity of pain, recent fall   PT Frequency 2x / week   PT  Duration 12 weeks   PT Treatment/Interventions ADLs/Self Care Home Management;Aquatic Therapy;Stair training;Cryotherapy;Electrical Stimulation;Moist Heat;Traction;Balance training;Therapeutic exercise;Therapeutic activities;Functional mobility training;Manual techniques;Scar mobilization;Dry needling;Patient/family education;Biofeedback;Other (comment);Neuromuscular re-education   PT Next Visit Plan fascial releases L medial thigh   PT Home Exercise Plan deep core strengthening   Consulted and Agree with Plan of Care Patient        Problem List There are no active problems to display for this patient.   Mariane Masters ,PT, DPT, E-RYT  06/11/2015, 4:13 PM  [place] [facility] MAIN [facility] SERVICES [address] [place], [place], [address] Phone: [phone]   Fax:  [phone]

## 2015-06-05 NOTE — Patient Instructions (Signed)
Frog, figure -4 , L extended side angle  Body scan 2x before bed Return to stretching HEP.

## 2015-06-07 ENCOUNTER — Other Ambulatory Visit: Payer: Self-pay

## 2015-06-07 ENCOUNTER — Emergency Department
Admission: EM | Admit: 2015-06-07 | Discharge: 2015-06-08 | Disposition: A | Discharge status: Home / Self Care | Payer: Managed Care, Other (non HMO) | Attending: Emergency Medicine | Admitting: Emergency Medicine

## 2015-06-07 ENCOUNTER — Emergency Department: Payer: Managed Care, Other (non HMO)

## 2015-06-07 DIAGNOSIS — R079 Chest pain, unspecified: Secondary | ICD-10-CM | POA: Diagnosis not present

## 2015-06-07 DIAGNOSIS — R002 Palpitations: Secondary | ICD-10-CM | POA: Diagnosis not present

## 2015-06-07 DIAGNOSIS — E876 Hypokalemia: Secondary | ICD-10-CM | POA: Diagnosis not present

## 2015-06-07 DIAGNOSIS — I10 Essential (primary) hypertension: Secondary | ICD-10-CM | POA: Diagnosis not present

## 2015-06-07 DIAGNOSIS — Z87891 Personal history of nicotine dependence: Secondary | ICD-10-CM | POA: Diagnosis not present

## 2015-06-07 DIAGNOSIS — E119 Type 2 diabetes mellitus without complications: Secondary | ICD-10-CM | POA: Diagnosis not present

## 2015-06-07 DIAGNOSIS — Z79899 Other long term (current) drug therapy: Secondary | ICD-10-CM | POA: Diagnosis not present

## 2015-06-07 DIAGNOSIS — Z7982 Long term (current) use of aspirin: Secondary | ICD-10-CM | POA: Insufficient documentation

## 2015-06-07 LAB — CBC
HEMATOCRIT: 37.7 % (ref 35.0–47.0)
Hemoglobin: 12.9 g/dL (ref 12.0–16.0)
MCH: 30.5 pg (ref 26.0–34.0)
MCHC: 34.3 g/dL (ref 32.0–36.0)
MCV: 88.8 fL (ref 80.0–100.0)
PLATELETS: 207 10*3/uL (ref 150–440)
RBC: 4.24 MIL/uL (ref 3.80–5.20)
RDW: 12.9 % (ref 11.5–14.5)
WBC: 6.8 10*3/uL (ref 3.6–11.0)

## 2015-06-07 LAB — BASIC METABOLIC PANEL
ANION GAP: 10 (ref 5–15)
BUN: 15 mg/dL (ref 6–20)
CALCIUM: 9.5 mg/dL (ref 8.9–10.3)
CO2: 28 mmol/L (ref 22–32)
Chloride: 98 mmol/L — ABNORMAL LOW (ref 101–111)
Creatinine, Ser: 0.85 mg/dL (ref 0.44–1.00)
Glucose, Bld: 133 mg/dL — ABNORMAL HIGH (ref 65–99)
Potassium: 2.6 mmol/L — CL (ref 3.5–5.1)
Sodium: 136 mmol/L (ref 135–145)

## 2015-06-07 LAB — TROPONIN I

## 2015-06-07 MED ORDER — POTASSIUM CHLORIDE 20 MEQ/15ML (10%) PO SOLN
40.0000 meq | Freq: Once | ORAL | Status: AC
  Administered 2015-06-08: 40 meq via ORAL
  Filled 2015-06-07: qty 30

## 2015-06-07 MED ORDER — POTASSIUM CHLORIDE CRYS ER 20 MEQ PO TBCR
40.0000 meq | EXTENDED_RELEASE_TABLET | Freq: Once | ORAL | Status: AC
  Administered 2015-06-08: 40 meq via ORAL
  Filled 2015-06-07: qty 2

## 2015-06-07 NOTE — ED Notes (Addendum)
Pt reports intermittent SOB since yesterday and Left upper chest pain that started today.  Hx of HTN and high cholesteral, and pre diabetic.  Pt. Reports taking  ASA 30 minutes ago.

## 2015-06-08 LAB — TROPONIN I

## 2015-06-08 LAB — MAGNESIUM: Magnesium: 1.7 mg/dL (ref 1.7–2.4)

## 2015-06-08 MED ORDER — MAGNESIUM SULFATE 2 GM/50ML IV SOLN
2.0000 g | Freq: Once | INTRAVENOUS | Status: AC
  Administered 2015-06-08: 2 g via INTRAVENOUS
  Filled 2015-06-08: qty 50

## 2015-06-08 MED ORDER — POTASSIUM CHLORIDE ER 20 MEQ PO TBCR: 20.0000 meq | EXTENDED_RELEASE_TABLET | Freq: Every day | ORAL | Status: DC

## 2015-06-08 NOTE — ED Notes (Signed)
Pt. States having palpitations today.  Pt. States having same symptoms in the past when taking HTN medications.  Pt. States lt. Arm felt like it was tingling.  Pt. Also states SOB breath the day before.

## 2015-06-08 NOTE — ED Provider Notes (Signed)
[facility] Emergency Department Provider Note  ____________________________________________  Time seen: Approximately 23:07 PM  I have reviewed the triage vital signs and the nursing notes.   HISTORY  Chief Complaint Shortness of Breath and Chest Pain   HPI Felicia Barnett is a 49 y.o. female who comes into the hospital with chest pain. The patient reports that she was at home watching television when she started feeling short of breath, sweaty and having palpitations. She reports that she also developed some chest pain and left arm tingling. He reports that the symptoms started around 7 PM and lasted about 2 hours. The patient reports that she took aspirin 81 mg and decided to come into the hospital. The patient reports that she's had similar symptoms of palpitations in the past and she is on by systolic for her palpitations. The patient also reports her sister noted that she was breathing fast yesterday and she was feeling her pulse in her ears. The patient reports that when she checked a heart rate monitor though it was only at 57. The patient reports that she did have some coffee this morning and has had no pain with breathing. She's had some mild posterior neck pain and abdominal burning. The patient was concerned so she decided to come in for evaluation   Past Medical History  Diagnosis Date  . Hypertension   . Heart murmur     palpatiations  . Hyperlipidemia   . Diabetes mellitus without complication     pre-diabetes  . Anxiety     since surgery, pt has had attacks w/ fear of not being able to move her leg (anxiety attacks occur  3-4x month.)    There are no active problems to display for this patient.   Past Surgical History  Procedure Laterality Date  . Abdominal hysterectomy    . Cardial ablation      anxiety attack (heart palpitations HR=210 bpm) the day she was released from the hospital from surgery. Findings were neg .    Current  Outpatient Rx  Name  Route  Sig  Dispense  Refill  . aspirin EC 81 MG tablet   Oral   Take 81 mg by mouth daily as needed for moderate pain.         . chlorthalidone (HYGROTON) 25 MG tablet   Oral   Take 25 mg by mouth daily.         . cholecalciferol (VITAMIN D) 1000 UNITS tablet   Oral   Take 1,000 Units by mouth daily.         . diphenhydrAMINE (SOMINEX) 25 MG tablet   Oral   Take 25 mg by mouth at bedtime as needed for sleep.         . nebivolol (BYSTOLIC) 5 MG tablet   Oral   Take 2.5 mg by mouth 2 (two) times daily.          Marland Kitchen omeprazole (PRILOSEC) 20 MG capsule   Oral   Take 20 mg by mouth daily.         . Prenatal Vit-Fe Fumarate-FA (PRENATAL MULTIVITAMIN) TABS tablet   Oral   Take 1 tablet by mouth daily at 12 noon.         . simvastatin (ZOCOR) 10 MG tablet   Oral   Take 10 mg by mouth daily.         . potassium chloride 20 MEQ TBCR   Oral   Take 20 mEq by mouth  daily.   7 tablet   0     Allergies Review of patient's allergies indicates no known allergies.  History reviewed. No pertinent family history.  Social History Social History  Substance Use Topics  . Smoking status: Former Smoker -- 20 years    Quit date: 03/02/2009  . Smokeless tobacco: Never Used  . Alcohol Use: No    Review of Systems Constitutional: Sweats with No fever/chills Eyes: No visual changes. ENT: No sore throat. Cardiovascular: chest pain and palpitations Respiratory:  shortness of breath. Gastrointestinal: No abdominal pain.  No nausea, no vomiting.  No diarrhea.  No constipation. Genitourinary: Negative for dysuria. Musculoskeletal: Negative for back pain. Skin: Negative for rash. Neurological: Negative for headaches, focal weakness or numbness.  10-point ROS otherwise negative.  ____________________________________________   PHYSICAL EXAM:  VITAL SIGNS: ED Triage Vitals  Enc Vitals Group     BP 06/07/15 2014 143/86 mmHg     Pulse Rate  06/07/15 2014 84     Resp 06/07/15 2014 16     Temp 06/07/15 2014 98.3 F (36.8 C)     Temp Source 06/07/15 2014 Oral     SpO2 06/07/15 2014 99 %     Weight 06/07/15 2014 160 lb (72.576 kg)     Height 06/07/15 2014 5' (1.524 m)     Head Cir --      Peak Flow --      Pain Score 06/07/15 2015 4     Pain Loc --      Pain Edu? --      Excl. in GC? --     Constitutional: Alert and oriented. Well appearing and in mild distress. Eyes: Conjunctivae are normal. PERRL. EOMI. Head: Atraumatic. Nose: No congestion/rhinnorhea. Mouth/Throat: Mucous membranes are moist.  Oropharynx non-erythematous. Cardiovascular: Normal rate, regular rhythm. Grossly normal heart sounds.  Good peripheral circulation. Respiratory: Normal respiratory effort.  No retractions. Lungs CTAB. Gastrointestinal: Soft and nontender. No distention. Positive bowel sounds Musculoskeletal: No lower extremity tenderness nor edema.   Neurologic:  Normal speech and language.  Skin:  Skin is warm, dry and intact.  Psychiatric: Mood and affect are normal.   ____________________________________________   LABS (all labs ordered are listed, but only abnormal results are displayed)  Labs Reviewed  BASIC METABOLIC PANEL - Abnormal; Notable for the following:    Potassium 2.6 (*)    Chloride 98 (*)    Glucose, Bld 133 (*)    All other components within normal limits  CBC  TROPONIN I  MAGNESIUM  TROPONIN I   ____________________________________________  EKG  ED ECG REPORT I, Rebecka Apley, the attending physician, personally viewed and interpreted this ECG.   Date: 06/07/2015  EKG Time: 2011  Rate: 83  Rhythm: normal EKG, normal sinus rhythm  Axis: normal  Intervals:none  ST&T Change: none  ____________________________________________  RADIOLOGY  CXR: no active cardiopulmonary disease ____________________________________________   PROCEDURES  Procedure(s) performed: None  Critical Care performed:  No  ____________________________________________   INITIAL IMPRESSION / ASSESSMENT AND PLAN / ED COURSE  Pertinent labs & imaging results that were available during my care of the patient were reviewed by me and considered in my medical decision making (see chart for details).  This is a 49 year old female who comes in today with some chest pain and palpitations. The patient's potassium was found to be low at 2.6 and her magnesium was also 1.7. It is likely that the patient's patient's are due to the caffeine which  the patient drink earlier today but also can be due to the abnormal electrolytes. The patient reports that she is taking hydrochlorothiazide in the prescription was increased recently which may be the cause of the low potassium. The patient received 40 mEq of potassium by mouth and 40 mEq of potassium IV. She also received 2 g of magnesium IV. The patient has not had any palpitations while in the emergency department. The patient will be discharged to home to follow-up with her primary care physician. ____________________________________________   FINAL CLINICAL IMPRESSION(S) / ED DIAGNOSES  Final diagnoses:  Chest pain, unspecified chest pain type  Palpitations  Hypokalemia  Hypomagnesemia      Rebecka Apley, MD 06/08/15 [phone]

## 2015-06-08 NOTE — ED Notes (Signed)
Patient discharged to home with husband.

## 2015-06-08 NOTE — Discharge Instructions (Signed)
Dolor de pecho (no especfico) (Chest Pain (Nonspecific)) Con frecuencia es difcil dar un diagnstico especfico de la causa del dolor de [place]. Siempre hay una posibilidad de que el dolor podra estar relacionado con algo grave, como un ataque al corazn o un cogulo sanguneo en los pulmones. Debe someterse a controles con el mdico para ms evaluaciones. CAUSAS   Acidez.  Neumona o bronquitis.  Ansiedad o estrs.  Inflamacin de la zona que rodea al corazn (pericarditis) o a los pulmones (pleuritis o pleuresa).  Un cogulo sanguneo en el pulmn.  Colapso de un pulmn (neumotrax), que puede aparecer de [employer] repentina por s solo (neumotrax espontneo) o debido a un traumatismo en el trax.  Culebrilla (virus del herpes zster). La pared torcica est compuesta por huesos, msculos y Database administrator. Cualquiera de estos puede ser la fuente del dolor.  Puede haber una contusin en los huesos debido a una lesin.  Puede haber un esguince en los msculos o el cartlago ocasionado por la tos o por [place].  El cartlago puede verse afectado por una inflamacin y Engineer, production (costocondritis). DIAGNSTICO  Ileene Hutchinson se necesiten anlisis de laboratorio u otros estudios para Animator causa del Social research officer, government. Adems, puede indicarle que se haga una prueba llamada electrocadiograma (ECG) ambulatorio. El ECG registra los patrones de los latidos cardacos durante 24horas. Adems, pueden hacerle otros estudios, por ejemplo:  Ecocardiograma transtorcico (ETT). Durante IT trainer, se usan ondas sonoras para evaluar el flujo de la sangre a travs del corazn.  Ecocardiograma transesofgico (ETE).  Monitoreo cardaco. Permite que el mdico controle la frecuencia y el ritmo cardaco en tiempo real.  Monitor Holter. Es un dispositivo porttil que [employer] latidos cardacos y [place] a Retail buyer las arritmias cardacas. Le permite al [employer] registrar la actividad [place], si es necesario.  Pruebas de estrs por ejercicio o por medicamentos que aceleran los latidos cardacos. TRATAMIENTO   El tratamiento depende de la causa del dolor de [place]. El tratamiento puede incluir:  Inhibidores de la acidez estomacal.  Antiinflamatorios.  Analgsicos para las enfermedades inflamatorias.  Antibiticos, si hay una infeccin.  Podrn aconsejarle que modifique su estilo de vida. Esto incluye dejar de fumar y evitar el alcohol, la cafena y el chocolate.  Pueden aconsejarle que mantenga la cabeza levantada (elevada) cuando duerme. Esto reduce la probabilidad de que el cido retroceda del estmago al esfago. En la [employer], el dolor de pecho no especfico mejorar en el trmino de 2 a 3das, con reposo y [employer].  INSTRUCCIONES PARA EL CUIDADO EN EL HOGAR   Si le prescriben antibiticos, tmelos tal como se le indic. Termnelos aunque comience a sentirse mejor.  [address] no haga actividades fsicas que provoquen dolor de [place]. Contine con las actividades fsicas tal como se le indic  No consuma ningn producto que contenga tabaco, incluidos cigarrillos, tabaco de Higher education careers adviser o cigarrillos electrnicos.  Evite el consumo de alcohol.  Tome los medicamentos solamente como se lo haya indicado el mdico.  Siga las sugerencias del mdico en lo que respecta a las pruebas adicionales, si el dolor de pecho no desaparece.  Concurra a todas las visitas de control programadas. Si no lo hace, podra desarrollar problemas permanentes (crnicos) relacionados con el dolor. Si hay algn problema para concurrir a una cita, llame para reprogramarla. SOLICITE ATENCIN MDICA SI:   El dolor de pecho no desaparece, incluso despus del tratamiento.  Tiene una erupcin cutnea con ampollas en el  pecho.  Lance Muss. SOLICITE ATENCIN MDICA DE [employer] SI:   Aumenta el dolor de pecho o este se irradia hacia el  brazo, el cuello, la [place], la espalda o el abdomen.  Le falta el aire.  La tos empeora, o expectora sangre.  Siente dolor intenso en la espalda o el abdomen.  Se siente nauseoso o vomita.  Siente debilidad intensa.  Se desmaya.  Tiene escalofros. Esto es Radio broadcast assistant. No espere a ver si el dolor se pasa. Obtenga ayuda mdica de inmediato. Llame a los servicios de emergencia locales (911 en los [place]). No conduzca por sus propios medios [employer] hospital. ASEGRESE DE QUE:   Comprende estas instrucciones.  Controlar su afeccin.  Recibir ayuda de inmediato si no mejora o si empeora. Document Released: 09/26/2005 Document Revised: 10/01/2013 [facility] Patient Information 2015 [place], [place]. This information is not intended to replace advice given to you by your health care provider. Make sure you discuss any questions you have with your health care provider.  Hipomagnesemia (Hypomagnesemia) El magnesio es un ion (mineral) que se encuentra en el [place], y que es necesario para el metabolismo. El metabolismo tiene que ver con el modo en que el organismo absorbe los alimentos y con otras reacciones qumicas necesarias para la vida. Slo se encuentra en la sangre alrededor del 2% del magnesio de nuestro organismo. Cuando el nivel es bajo, se habla de hipomagnesemia. En la sangre podr medirse slo una pequea cantidad del magnesio presente en el organismo. [employer] es bajo, no significa que es bajo en todo el organismo. La concentracin srica normal oscila entre 1.8 y 2.5 mEq/L. Cuando este nivel es menor a 1.0 mEq/L, ocurren una serie de [place].  CAUSAS  Recibir lquidos por va intravenosa sin reposicin de magnesio.  Prdida del magnesio intestinal por succin nasogstrica.  Prdida del magnesio provocada por nuseas y vmitos o diarrea grave. Cualquiera de las enfermedades inflamatorias del intestino pueden causar este trastorno.  El consumo  excesivo de alcohol a veces ocasiona bajo nivel del magnesio srico.  Neomia Dear forma heredada de prdida de magnesio ocurre cuando se pierde a travs de los riones. Se denomina hipomagnesemia familiar o primaria.  Algunos medicamentos, como por [employer] diurticos pueden causar prdida de [place]. SNTOMAS Los problemas que se enumeran a continuacin empeoran si los cambios en los niveles de magnesio ocurren bruscamente:  Temblores.  Confusin.  Debilitamiento muscular.  Aumento de la sensibilidad visual y [place].  Sensibilidad en los reflejos.  Depresin.  Fibrilaciones musculares.  Hiperreactividad neurolgica.  Irritabilidad.  Psicosis.  Espasmos de los msculos de la mano.  [place] (los msculos sufren espasmos incontrolables). DIAGNSTICO El trastorno se diagnostica a travs de [place]. TRATAMIENTO  En los casos de emergencia se administra magnesio por va intravenosa (a travs de la vena).  Si el problema no es tan urgente, se corrige con la dieta. Niveles elevados de magnesio pueden [employer] vegetales de [place], arvejas, porotos y [place], [place]. Tambin puede administrarse por medio de medicamentos por va oral.  Si la causa se debe al uso de ciertos medicamentos, habr que Forensic psychologist.  Si el problema es el alcohol, puede disponer de ayuda si tiene dificultad para dejar de consumirlo. Document Released: 09/08/2008 Document Revised: 12/19/2011 [facility] Patient Information 2015 [place], [place]. This information is not intended to replace advice given to you by your health care provider. Make sure you discuss any questions you have with your health care provider.  Hipokalemia ( Hypokalemia) Hipokalemia significa que el nivel de potasio en sangre es menor que lo normal. El potasio es un electrolito que ayuda a regular la cantidad de lquido del organismo. Tambin estimula la contraccin muscular y ayuda a que la funcin muscular  sea la [place]. La Gwendolyn Fill del potasio del organismo se encuentra dentro de las clulas y slo una pequea cantidad en la sangre. Debido a que la cantidad en la sangre es muy pequea, pequeos cambios en la sangre pueden poner en peligro la vida. CAUSAS  Antibiticos.  Diarrea o vmitos.  El uso excesivo de laxantes, lo que puede causar diarrea.  Enfermedad renal crnica.  Uso de diurticos.  Trastornos de Psychologist, sport and exercise (bulimia).  Bajos niveles de magnesio.  Sudoracin abundante. SIGNOS Y SNTOMAS  Debilidad.  Estreimiento.  Fatiga.  Calambres musculares.  Confusin mental.  Latidos cardacos salteados o irregulares (palpitaciones).  Hormigueo o adormecimiento. DIAGNSTICO  El mdico puede diagnosticar hipokalemia por los anlisis de [place]. Adems para controlar sus niveles de potasio, el mdico podr ordenar otros anlisis de laboratorio. TRATAMIENTO La hipokalemia puede tratarse con suplementos de potasio por va oral o realizando ajustes en sus medicamentos habituales. Si sus niveles de potasio son muy bajos, ser necesario que lo reciba a travs de una vena (IV) y se lo controle en el hospital. Neomia Dear dieta rica en potasio tambin puede ser de [place]. Los alimentos ricos en potasio son:  Evalyn Casco secos, como cacahuetes y pistachos.  Semillas, como semillas de girasol y de [place].  Porotos, guisantes secos y lentejas.  Granos enteros y panes y cereales con salvado.  Nils Pyle y vegetales frescos como damascos, avocado, bananas, meln, kiwi, naranjas, esprragos y patatas.  Jugos de naranja y tomates.  Carnes rojas.  Yogur con frutas. INSTRUCCIONES PARA EL CUIDADO EN EL HOGAR  Tome todos los [employer]ée [employer]uder indic el mdico.  Siga una dieta saludable e incluya alimentos nutritivos como frutas, vegetales, nueces, granos enteros y carnes [place].  Si est tomando laxantes, asegrese de seguir las instrucciones del envase. SOLICITE ATENCIN MDICA  SI:  La debilidad empeora.  Siente que el corazn late fuerte o est acelerado.  Vomita o tiene diarrea.  Tiene problemas para mantener su nivel de glucosa en el rango normal. SOLICITE ATENCIN MDICA DE INMEDIATO SI:  Siente dolor en el pecho, le falta de aire o se siente mareado.  Vomita o tiene diarrea durante ms de 2 [address]  [address]  Se desmaya. ASEGRESE DE QUE:   Comprende estas instrucciones.  Controlar su afeccin.  Recibir ayuda de inmediato si no mejora o si empeora. Document Released: 09/26/2005 Document Revised: 07/17/2013 [facility] Patient Information 2015 [place], [place]. This information is not intended to replace advice given to you by your health care provider. Make sure you discuss any questions you have with your health care provider.  Palpitaciones (Palpitations) Es la sensacin de sentir que el latido cardaco es irregular o es ms rpido que lo normal. Se siente como un aleteo o que falta un latido. Generalmente no es un problema grave. Sin embargo, en algunos casos podra ser necesario hacer ms estudios diagnsticos. CAUSAS  Las causas de las palpitaciones pueden ser:  Rosalva Ferron.  El consumo de cafena u otros estimulantes, como pastillas para Geophysical data processor o bebidas energizantes.  Alcohol.  Situaciones de estrs y [place].  La actividad fsica extenuante.  Fatiga.  Algunos medicamentos.  Enfermedad cardaca, especialmente si tiene antecedentes de ritmo cardaco irregular (arritmia), como fibrilacin auricular, aleteo auricular o taquicardia supraventricular.  El [place]  incorrecto de Youth worker. DIAGNSTICO  Para hallar la causa de las palpitaciones, el mdico le har una historia clnica y un examen fsico. El mdico tambin puede hacerle un estudio llamado electrocardiograma (ECG) ambulatorio. El ECG registra el patrn de los latidos cardacos durante un perodo de 24horas. Tambin pueden hacerle otros estudios, por  ejemplo:  Ecocardiograma transtorcico (ETT). Durante Management consultant, se usan ondas sonoras para evaluar cmo fluye la sangre por el corazn.  Ecocardiograma transesofgico (ETE).  Monitoreo cardaco. Este estudio permite que el mdico controle la frecuencia y el ritmo cardaco en tiempo real.  Monitor Holter. Es un dispositivo porttil que [employer] latidos cardacos y [place] a Education administrator las arritmias cardacas. Le permite al [employer] registrar la actividad cardaca durante varios das, si es necesario.  Pruebas de estrs por ejercicio o por medicamentos que aceleran los latidos cardacos. TRATAMIENTO  El tratamiento de las palpitaciones depende de la causa y puede variar mucho. En la [employer] no se requiere otro tratamiento que esperar, Lexicographer y Pharmacologist los sntomas. Otras causas, como la fibrilacin auricular, el aleteo auricular o la taquicardia supraventricular generalmente requieren Pharmacist, community. INSTRUCCIONES PARA EL CUIDADO EN EL HOGAR   Evite:  Bebidas que contengan cafena como el caf, el t, los refrescos, las pastillas para Geophysical data processor y las bebidas energizantes.  Chocolate.  Alcohol.  Si fuma, abandone el hbito.  Reduzca los niveles de estrs y [place]. Algunas cosas que pueden ayudarlo a relajarse son:  Un mtodo para controlar el cuerpo con la mente, por ejemplo, controlar los latidos (biorregulacin).  El yoga.  La meditacin.  La actividad fsica como natacin, trote o caminatas.  Descanse y duerma lo suficiente. SOLICITE ATENCIN MDICA SI:   Contina con latidos cardacos rpidos o irregulares durante ms de 24 horas.  Las [employer] suceden con ms frecuencia. SOLICITE ATENCIN MDICA DE INMEDIATO SI:  Siente falta de aire o dolor en el pecho.  Sufre un dolor intenso de [place].  Se siente mareado o se desmaya. ASEGRESE DE QUE:  Comprende estas instrucciones.  Controlar su afeccin.  Recibir ayuda de  inmediato si no mejora o si empeora. Document Released: 07/06/2005 Document Revised: 10/01/2013 [facility] Patient Information 2015 [place], [place]. This information is not intended to replace advice given to you by your health care provider. Make sure you discuss any questions you have with your health care provider.

## 2015-06-10 ENCOUNTER — Encounter: Payer: BLUE CROSS/BLUE SHIELD | Admitting: Physical Therapy

## 2015-06-11 ENCOUNTER — Ambulatory Visit: Payer: Managed Care, Other (non HMO) | Attending: Student | Admitting: Physical Therapy

## 2015-06-11 VITALS — BP 102/72

## 2015-06-11 DIAGNOSIS — IMO0001 Reserved for inherently not codable concepts without codable children: Secondary | ICD-10-CM

## 2015-06-11 DIAGNOSIS — M609 Myositis, unspecified: Secondary | ICD-10-CM | POA: Diagnosis present

## 2015-06-11 DIAGNOSIS — S39001S Unspecified injury of muscle, fascia and tendon of abdomen, sequela: Secondary | ICD-10-CM | POA: Diagnosis present

## 2015-06-11 DIAGNOSIS — M791 Myalgia: Secondary | ICD-10-CM | POA: Diagnosis present

## 2015-06-11 DIAGNOSIS — Z7409 Other reduced mobility: Secondary | ICD-10-CM | POA: Insufficient documentation

## 2015-06-12 NOTE — Therapy (Signed)
[facility] MAIN [facility] SERVICES [address] [place], [place], [address] Phone: [phone]   Fax:  [phone]  Physical Therapy Treatment  Patient Details  Name: Felicia Barnett MRN: [id number] Date of Birth: Dec 30, 1965 Referring Provider:  Purvis Sheffield, MD  Encounter Date: 06/11/2015      PT End of Session - 06/12/15 2309    Visit Number 11   Number of Visits 16   Date for PT Re-Evaluation 07/10/15   PT Start Time 1405   PT Stop Time 1505   PT Time Calculation (min) 60 min   Activity Tolerance Patient tolerated treatment well;No increased pain   Behavior During Therapy [facility] for tasks assessed/performed      Past Medical History  Diagnosis Date  . Hypertension   . Heart murmur     palpatiations  . Hyperlipidemia   . Diabetes mellitus without complication     pre-diabetes  . Anxiety     since surgery, pt has had attacks w/ fear of not being able to move her leg (anxiety attacks occur  3-4x month.)    Past Surgical History  Procedure Laterality Date  . Abdominal hysterectomy    . Cardial ablation      anxiety attack (heart palpitations HR=210 bpm) the day she was released from the hospital from surgery. Findings were neg .    Filed Vitals:   06/11/15 1619  BP: 102/72    Visit Diagnosis:  Myalgia and myositis  Unspecified injury of muscle, fascia and tendon of abdomen, sequela  Impaired mobility      Subjective Assessment - 06/11/15 1619    Subjective Pt reported on 06/07/15 that her family admitted her to the ED due to heart palpitations, increased pain in the L knee and L thumb, and SOC. Pt was dx with low K+/ Mg levels which she was informed by the ED staff that she was having side-effects from her water pill medication that she had been taking for 3 months.  Pt's MD has discontinue taking her water pill and now is on a new hypertension medication. Since then, pt has not had any pain in her L knee, no other cardiac  Sx.  Pt had a f/u yesterday and her tests were normal.  Today she only experience the numbness and tingling in her L thigh.     Pertinent History cardiac ablation 2/2 anxiety attack (heart palpitations HR=210 bpm) the day she was released from the hospital. Hx of 2 c-sections, Currently experiencing constipation bowel movement every other day   How long can you stand comfortably? 45 min   Patient Stated Goals feel better, tie shoes, painfree sexual intercourse            [facility] PT Assessment - 06/12/15 2246    Sensation   Light Touch --  Pre-Tx: L2,3 on Left decreased to R, Post-Tx, L2 equal   Palpation   Palpation comment significant fascial restrictions along posterior/medial L thigh, significant tenderness,    post-Tx, decreased tenderness, increased fascial mobility                     [facility] Adult PT Treatment/Exercise - 06/12/15 2304    Self-Care   Self-Care edu on fascial restrictions from scar tissue    Neuro Re-ed    Neuro Re-ed Details  dynamic stabilization 1-2    Exercises   Other Exercises  wide pelvic rotation, extended side angle   Manual Therapy   Myofascial  Release fascial releases along L medial thigh   strumming, Edge Tool, jostling                PT Education - 06/12/15 2308    Education provided Yes   Education Details HEP   Person(s) Educated Patient   Methods Explanation;Demonstration;Tactile cues;Verbal cues   Comprehension Verbalized understanding;Returned demonstration             PT Long Term Goals - 06/12/15 2314    PT LONG TERM GOAL #1   Title Pt will gain increased hip IR on L from 20 deg to 40 deg in order to return to fitness routines.    Time 12   Period Weeks   Status Achieved   PT LONG TERM GOAL #2   Title Pt will demo increased hip ER/abd ( lat tib plateau to plinth from L/27 cm to L < 20 cm) in order to achieve sexual positions without pain.    Time 12   Period Weeks   Status Achieved   PT LONG TERM GOAL  #3   Title Pt will be able to tie shoes without pain and with functional range of motion.    Time 12   Period Weeks   Status Achieved   PT LONG TERM GOAL #4   Title Pt will demo proper deep core activation without cuing Dynamic Stabilization 1-4 (5 reps each) in order to minimize relapse of Sx in pelvis and back and perform fitness routines.    Time 12   Period Weeks   Status On-going   PT LONG TERM GOAL #5   Title Pt will report being able to tolerate sitting on hard surfaced chairs for > 30 min without shifting weight off ishial tuberosities.    Time 12   Period Weeks   Status Achieved   PT LONG TERM GOAL #6   Title Pt will report no leakage with coughing and no pain with sexual intercourse in order to perform her ADLs.   Time 12   Period Weeks   Status Achieved   PT LONG TERM GOAL #7   Title Pt will demo increased hip ER/abd ( lat tib plateau to plinth from L/25 cm to L 18 cm) and hip flexion from 90deg to 100 deg in order to achieve sexual positions without pain and to tie her shoe.    Time 12   Period Weeks   Status Achieved   PT LONG TERM GOAL #8   Title Pt will report an increase in improvement with numbness/tingling sensation on her L medial thigh at dermatome L2, L3 by reporting equal sensation to light touch in order to improve QOL.    Time 12   Period Weeks   Status Revised               Plan - 06/12/15 2309    Clinical Impression Statement Pt tolerated manual Tx without increased pain and demo'd significantly decreased fascial restrictions/ tenderness along posteromedial L thigh post-Tx. Pt regained symmetrical sensation to light touch on L 2 on L and R thighs post-Tx. Initiated dynamic stabilization 1-2. Anticipate will continue to benefit from manual Tx to address remaining deficits of decreased sensation at L3 on L LE.      Pt will benefit from skilled therapeutic intervention in order to improve on the following deficits Abnormal gait;Difficulty  walking;Increased muscle spasms;Postural dysfunction;Improper body mechanics;Decreased mobility;Decreased activity tolerance;Decreased endurance;Decreased scar mobility;Increased fascial restricitons;Decreased coordination;Pain;Hypomobility;Decreased range of motion;Impaired flexibility;Decreased strength   Rehab  Potential Good   Clinical Impairments Affecting Rehab Potential chronicity of pain, recent fall   PT Frequency 1x / week   PT Duration 12 weeks   PT Treatment/Interventions ADLs/Self Care Home Management;Aquatic Therapy;Stair training;Cryotherapy;Electrical Stimulation;Moist Heat;Traction;Balance training;Therapeutic exercise;Therapeutic activities;Functional mobility training;Manual techniques;Scar mobilization;Dry needling;Patient/family education;Biofeedback;Other (comment);Neuromuscular re-education   PT Next Visit Plan fascial releases L medial thigh   PT Home Exercise Plan deep core strengthening        Problem List There are no active problems to display for this patient.   Mariane Masters  ,PT, DPT, E-RYT  06/12/2015, 11:21 PM  [facility] MAIN [facility] SERVICES [address] [place], [place], [address] Phone: [phone]   Fax:  [phone]

## 2015-06-12 NOTE — Therapy (Deleted)
[facility] MAIN [facility] SERVICES [address] [place], [place], [address] Phone: [phone]   Fax:  [phone]  Physical Therapy Treatment  Patient Details  Name: Felicia Barnett MRN: [id number] Date of Birth: 1966/02/20 Referring Provider:  Purvis Sheffield, MD  Encounter Date: 06/11/2015      PT End of Session - 06/12/15 2309    Visit Number 11   Number of Visits 16   Date for PT Re-Evaluation 07/10/15   PT Start Time 1405   PT Stop Time 1505   PT Time Calculation (min) 60 min   Activity Tolerance Patient tolerated treatment well;No increased pain   Behavior During Therapy [facility] for tasks assessed/performed      Past Medical History  Diagnosis Date  . Hypertension   . Heart murmur     palpatiations  . Hyperlipidemia   . Diabetes mellitus without complication     pre-diabetes  . Anxiety     since surgery, pt has had attacks w/ fear of not being able to move her leg (anxiety attacks occur  3-4x month.)    Past Surgical History  Procedure Laterality Date  . Abdominal hysterectomy    . Cardial ablation      anxiety attack (heart palpitations HR=210 bpm) the day she was released from the hospital from surgery. Findings were neg .    Filed Vitals:   06/11/15 1619  BP: 102/72    Visit Diagnosis:  Myalgia and myositis  Unspecified injury of muscle, fascia and tendon of abdomen, sequela  Impaired mobility      Subjective Assessment - 06/11/15 1619    Subjective Pt reported on 06/07/15 that her family admitted her to the ED due to heart palpitations, increased pain in the L knee and L thumb, and SOC. Pt was dx with low K+/ Mg levels which she was informed by the ED staff that she was having side-effects from her water pill medication that she had been taking for 3 months.  Pt's MD has discontinue taking her water pill and now is on a new hypertension medication. Since then, pt has not had any pain in her L knee, no other cardiac  Sx.  Pt had a f/u yesterday and her tests were normal.  Today she only experience the numbness and tingling in her L thigh.     Pertinent History cardiac ablation 2/2 anxiety attack (heart palpitations HR=210 bpm) the day she was released from the hospital. Hx of 2 c-sections, Currently experiencing constipation bowel movement every other day   How long can you stand comfortably? 45 min   Patient Stated Goals feel better, tie shoes, painfree sexual intercourse            [facility] PT Assessment - 06/12/15 2246    Sensation   Light Touch --  Pre-Tx: L2,3 on Left decreased to R, Post-Tx, L2 equal   Palpation   Palpation comment significant fascial restrictions along posterior/medial L thigh, significant tenderness,    post-Tx, decreased tenderness, increased fascial mobility                     [facility] Adult PT Treatment/Exercise - 06/12/15 2304    Self-Care   Self-Care    Neuro Re-ed    Neuro Re-ed Details  dynamic stabilization 1-2    Exercises   Other Exercises     Manual Therapy   Myofascial Release fascial releases along L medial thigh   strumming, Edge Tool,  jostling                PT Education - 06/12/15 2308    Education provided Yes   Education Details HEP   Person(s) Educated Patient   Methods Explanation;Demonstration;Tactile cues;Verbal cues   Comprehension Verbalized understanding;Returned demonstration             PT Long Term Goals - 06/12/15 2314    PT LONG TERM GOAL #1   Title Pt will gain increased hip IR on L from 20 deg to 40 deg in order to return to fitness routines.    Time 12   Period Weeks   Status Achieved   PT LONG TERM GOAL #2   Title Pt will demo increased hip ER/abd ( lat tib plateau to plinth from L/27 cm to L < 20 cm) in order to achieve sexual positions without pain.    Time 12   Period Weeks   Status Achieved   PT LONG TERM GOAL #3   Title Pt will be able to tie shoes without pain and with functional range of  motion.    Time 12   Period Weeks   Status Achieved   PT LONG TERM GOAL #4   Title Pt will demo proper deep core activation without cuing Dynamic Stabilization 1-4 (5 reps each) in order to minimize relapse of Sx in pelvis and back and perform fitness routines.    Time 12   Period Weeks   Status On-going   PT LONG TERM GOAL #5   Title Pt will report being able to tolerate sitting on hard surfaced chairs for > 30 min without shifting weight off ishial tuberosities.    Time 12   Period Weeks   Status Achieved   PT LONG TERM GOAL #6   Title Pt will report no leakage with coughing and no pain with sexual intercourse in order to perform her ADLs.   Time 12   Period Weeks   Status Achieved   PT LONG TERM GOAL #7   Title Pt will demo increased hip ER/abd ( lat tib plateau to plinth from L/25 cm to L 18 cm) and hip flexion from 90deg to 100 deg in order to achieve sexual positions without pain and to tie her shoe.    Time 12   Period Weeks   Status Achieved   PT LONG TERM GOAL #8   Title Pt will report an increase in improvement with numbness/tingling sensation on her L medial thigh at dermatome L2, L3 by reporting equal sensation to light touch in order to improve QOL.    Time 12   Period Weeks   Status Revised               Plan - 06/12/15 2309    Clinical Impression Statement Pt tolerated manual Tx without increased pain and demo'd significantly decreased fascial restrictions/ tenderness along posteromedial L thigh post-Tx. Pt regained equal sensation to light touch on L 2 bilaterally  post-Tx. Initiated dynamic stabilization 1-2. Anticipate will continue to benefit from manual Tx to address remaining deficits of decreased sensation at L3 on L LE.      Pt will benefit from skilled therapeutic intervention in order to improve on the following deficits Abnormal gait;Difficulty walking;Increased muscle spasms;Postural dysfunction;Improper body mechanics;Decreased mobility;Decreased  activity tolerance;Decreased endurance;Decreased scar mobility;Increased fascial restricitons;Decreased coordination;Pain;Hypomobility;Decreased range of motion;Impaired flexibility;Decreased strength   Rehab Potential Good   Clinical Impairments Affecting Rehab Potential chronicity of pain, recent fall  PT Frequency 1x / week   PT Duration 12 weeks   PT Treatment/Interventions ADLs/Self Care Home Management;Aquatic Therapy;Stair training;Cryotherapy;Electrical Stimulation;Moist Heat;Traction;Balance training;Therapeutic exercise;Therapeutic activities;Functional mobility training;Manual techniques;Scar mobilization;Dry needling;Patient/family education;Biofeedback;Other (comment);Neuromuscular re-education   PT Next Visit Plan fascial releases L medial thigh   PT Home Exercise Plan deep core strengthening        Problem List There are no active problems to display for this patient.   Mariane Masters  ,PT, DPT, E-RYT  06/12/2015, 11:18 PM  [facility] MAIN [facility] SERVICES [address] [place], [place], [address] Phone: [phone]   Fax:  [phone]

## 2015-06-18 ENCOUNTER — Ambulatory Visit: Payer: Managed Care, Other (non HMO) | Admitting: Physical Therapy

## 2015-06-25 ENCOUNTER — Ambulatory Visit: Payer: Managed Care, Other (non HMO) | Admitting: Physical Therapy

## 2015-06-25 DIAGNOSIS — S39001S Unspecified injury of muscle, fascia and tendon of abdomen, sequela: Secondary | ICD-10-CM

## 2015-06-25 DIAGNOSIS — IMO0001 Reserved for inherently not codable concepts without codable children: Secondary | ICD-10-CM

## 2015-06-25 DIAGNOSIS — M791 Myalgia: Secondary | ICD-10-CM | POA: Diagnosis not present

## 2015-06-25 DIAGNOSIS — Z7409 Other reduced mobility: Secondary | ICD-10-CM

## 2015-06-26 NOTE — Therapy (Signed)
[facility] MAIN [facility] SERVICES [address] [place], [place], [address] Phone: [phone]   Fax:  [phone]  Physical Therapy Treatment  Patient Details  Name: Felicia Barnett MRN: [id number] Date of Birth: 1966-09-09 Referring Provider:  Purvis Sheffield, MD  Encounter Date: 06/25/2015      PT End of Session - 06/25/15 1716    Visit Number 12   Number of Visits 16   Date for PT Re-Evaluation 07/10/15   PT Start Time 1715   PT Stop Time 1815   PT Time Calculation (min) 60 min   Activity Tolerance Patient tolerated treatment well;No increased pain   Behavior During Therapy [facility] for tasks assessed/performed      Past Medical History  Diagnosis Date  . Hypertension   . Heart murmur     palpatiations  . Hyperlipidemia   . Diabetes mellitus without complication     pre-diabetes  . Anxiety     since surgery, pt has had attacks w/ fear of not being able to move her leg (anxiety attacks occur  3-4x month.)    Past Surgical History  Procedure Laterality Date  . Abdominal hysterectomy    . Cardial ablation      anxiety attack (heart palpitations HR=210 bpm) the day she was released from the hospital from surgery. Findings were neg .    There were no vitals filed for this visit.  Visit Diagnosis:  Myalgia and myositis  Unspecified injury of muscle, fascia and tendon of abdomen, sequela  Impaired mobility      Subjective Assessment - 06/25/15 1716    Subjective Pt reports her L thigh is still feeling numb but it is better but pt is still concerned . Pt reported she practiced her HEP 2x this week.    Pertinent History cardiac ablation 2/2 anxiety attack (heart palpitations HR=210 bpm) the day she was released from the hospital. Hx of 2 c-sections, Currently experiencing constipation bowel movement every other day   How long can you stand comfortably? 45 min   Patient Stated Goals feel better, tie shoes, painfree sexual  intercourse            [facility] PT Assessment - 06/26/15 1917    Sensation   Light Touch --  pre-Tx. L3 decreased on L > R, post-Tx: L3 equal                     [facility] Adult PT Treatment/Exercise - 06/26/15 1912    Neuro Re-ed    Neuro Re-ed Details  reviewed dynamic stab 1-2, alignment for extended side angle L  Gate pose   Manual Therapy   Myofascial Release fascial releases along L medial thigh   strumming, stripping, jostling                PT Education - 06/26/15 1920    Education provided Yes   Education Details HEP   Person(s) Educated Patient   Methods Explanation;Demonstration;Tactile cues;Verbal cues   Comprehension Verbalized understanding;Returned demonstration             PT Long Term Goals - 06/12/15 2314    PT LONG TERM GOAL #1   Title Pt will gain increased hip IR on L from 20 deg to 40 deg in order to return to fitness routines.    Time 12   Period Weeks   Status Achieved   PT LONG TERM GOAL #2   Title Pt will demo increased  hip ER/abd ( lat tib plateau to plinth from L/27 cm to L < 20 cm) in order to achieve sexual positions without pain.    Time 12   Period Weeks   Status Achieved   PT LONG TERM GOAL #3   Title Pt will be able to tie shoes without pain and with functional range of motion.    Time 12   Period Weeks   Status Achieved   PT LONG TERM GOAL #4   Title Pt will demo proper deep core activation without cuing Dynamic Stabilization 1-4 (5 reps each) in order to minimize relapse of Sx in pelvis and back and perform fitness routines.    Time 12   Period Weeks   Status On-going   PT LONG TERM GOAL #5   Title Pt will report being able to tolerate sitting on hard surfaced chairs for > 30 min without shifting weight off ishial tuberosities.    Time 12   Period Weeks   Status Achieved   PT LONG TERM GOAL #6   Title Pt will report no leakage with coughing and no pain with sexual intercourse in order to perform her  ADLs.   Time 12   Period Weeks   Status Achieved   PT LONG TERM GOAL #7   Title Pt will demo increased hip ER/abd ( lat tib plateau to plinth from L/25 cm to L 18 cm) and hip flexion from 90deg to 100 deg in order to achieve sexual positions without pain and to tie her shoe.    Time 12   Period Weeks   Status Achieved   PT LONG TERM GOAL #8   Title Pt will report an increase in improvement with numbness/tingling sensation on her L medial thigh at dermatome L2, L3 by reporting equal sensation to light touch in order to improve QOL.    Time 12   Period Weeks   Status Revised               Plan - 06/26/15 1921    Clinical Impression Statement Pt regained light touch sensation on L 3 dermatome on L thigh post-Tx. Pt is progressing well towards her goals.    Pt will benefit from skilled therapeutic intervention in order to improve on the following deficits Abnormal gait;Difficulty walking;Increased muscle spasms;Postural dysfunction;Improper body mechanics;Decreased mobility;Decreased activity tolerance;Decreased endurance;Decreased scar mobility;Increased fascial restricitons;Decreased coordination;Pain;Hypomobility;Decreased range of motion;Impaired flexibility;Decreased strength   Rehab Potential Good   Clinical Impairments Affecting Rehab Potential chronicity of pain, recent fall   PT Frequency 1x / week   PT Duration 12 weeks   PT Treatment/Interventions ADLs/Self Care Home Management;Aquatic Therapy;Stair training;Cryotherapy;Electrical Stimulation;Moist Heat;Traction;Balance training;Therapeutic exercise;Therapeutic activities;Functional mobility training;Manual techniques;Scar mobilization;Dry needling;Patient/family education;Biofeedback;Other (comment);Neuromuscular re-education   PT Next Visit Plan fascial releases L medial thigh   PT Home Exercise Plan deep core strengthening        Problem List There are no active problems to display for this patient.   Mariane Masters ,PT, DPT, E-RYT  06/26/2015, 7:23 PM  [place] [facility] MAIN [facility] SERVICES [address] [place], [place], [address] Phone: [phone]   Fax:  [phone]

## 2015-06-26 NOTE — Patient Instructions (Signed)
Self massage in figure 4 stretch at work  Extended side angle Gate pose

## 2015-06-30 ENCOUNTER — Ambulatory Visit: Payer: Managed Care, Other (non HMO) | Admitting: Physical Therapy

## 2015-07-02 ENCOUNTER — Ambulatory Visit: Payer: Managed Care, Other (non HMO) | Admitting: Physical Therapy

## 2015-07-08 ENCOUNTER — Other Ambulatory Visit: Payer: Self-pay | Admitting: Family Medicine

## 2015-07-08 DIAGNOSIS — Z1231 Encounter for screening mammogram for malignant neoplasm of breast: Secondary | ICD-10-CM

## 2015-07-14 ENCOUNTER — Encounter: Payer: Managed Care, Other (non HMO) | Admitting: Physical Therapy

## 2015-07-14 DIAGNOSIS — R202 Paresthesia of skin: Secondary | ICD-10-CM | POA: Insufficient documentation

## 2015-07-27 ENCOUNTER — Ambulatory Visit: Payer: Managed Care, Other (non HMO) | Attending: Student | Admitting: Physical Therapy

## 2015-07-27 DIAGNOSIS — IMO0001 Reserved for inherently not codable concepts without codable children: Secondary | ICD-10-CM

## 2015-07-27 DIAGNOSIS — M609 Myositis, unspecified: Secondary | ICD-10-CM | POA: Diagnosis present

## 2015-07-27 DIAGNOSIS — M791 Myalgia: Secondary | ICD-10-CM | POA: Insufficient documentation

## 2015-07-27 DIAGNOSIS — S39001S Unspecified injury of muscle, fascia and tendon of abdomen, sequela: Secondary | ICD-10-CM | POA: Diagnosis present

## 2015-07-27 DIAGNOSIS — Z7409 Other reduced mobility: Secondary | ICD-10-CM | POA: Diagnosis present

## 2015-07-28 NOTE — Therapy (Signed)
[place] MAIN [facility] SERVICES [address] [place], [place], [address] Phone: [phone]   Fax:  [phone]  Physical Therapy Treatment  Patient Details  Name: Felicia Barnett MRN: [id number] Date of Birth: Oct 30, 1965 No Data Recorded  Encounter Date: 07/27/2015      PT End of Session - 07/28/15 2129    Visit Number 13   Number of Visits 16   Date for PT Re-Evaluation 07/10/15   PT Start Time 1410   PT Stop Time 1510   PT Time Calculation (min) 60 min   Activity Tolerance Patient tolerated treatment well;No increased pain   Behavior During Therapy [facility] for tasks assessed/performed      Past Medical History  Diagnosis Date  . Hypertension   . Heart murmur     palpatiations  . Hyperlipidemia   . Diabetes mellitus without complication     pre-diabetes  . Anxiety     since surgery, pt has had attacks w/ fear of not being able to move her leg (anxiety attacks occur  3-4x month.)    Past Surgical History  Procedure Laterality Date  . Abdominal hysterectomy    . Cardial ablation      anxiety attack (heart palpitations HR=210 bpm) the day she was released from the hospital from surgery. Findings were neg .    There were no vitals filed for this visit.  Visit Diagnosis:  Myalgia and myositis - Plan: PT plan of care cert/re-cert  Unspecified injury of muscle, fascia and tendon of abdomen, sequela - Plan: PT plan of care cert/re-cert  Impaired mobility - Plan: PT plan of care cert/re-cert      Subjective Assessment - 07/28/15 2109    Subjective Pt returns to PT after a month hiatus due to family circumstances. Pt reports she continues to have numbness and tingling at the distal medial part of her L thigh. Pt stated she saw Dr. Otho Perl who recommended her to continue PT and also referred her to get injections w/ another provider at [facility]. Pt stated she did not understand what the injections would do. PT called and left a message  with RN line that pt would benefit from being explained more details in Spanish which is pt's native language. Pt stated she continues to be able to tie her shoes and have non-painful intercourse. 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 She has scheduled sessions 2x this week and next to fit in sessions before her job begins.       Pertinent History cardiac ablation 2/2 anxiety attack (heart palpitations HR=210 bpm) the day she was released from the hospital. Hx of 2 c-sections, Currently experiencing constipation bowel movement every other day   How long can you stand comfortably? 45 min   Patient Stated Goals feel better, tie shoes, painfree sexual intercourse            [facility] PT Assessment - 07/28/15 2116    Sensation   Light Touch --  :L3 on L < R, post-Tx: (closer to medial border of patella)    Palpation   Palpation comment moderately restricted fascia along distal medial thigh (L3) on L                      [facility] Adult PT Treatment/Exercise - 07/28/15 2129    Self-Care   Self-Care --  body scan 3 cycles   Exercises   Other Exercises  happy baby pose with strap    Moist Heat Therapy   Number Minutes Moist Heat 10 Minutes   Moist Heat Location Other (comment)  L thigh   Manual Therapy   Myofascial Release fascial releases along L medial thigh   strumming, stripping, jostling                PT Education - 07/28/15 2129    Education provided Yes   Education Details HEP   Person(s) Educated Patient   Methods Explanation;Demonstration;Tactile cues;Verbal cues   Comprehension Verbalized understanding;Returned demonstration             PT Long Term Goals - 07/28/15 2130    PT LONG TERM GOAL #1   Title Pt will gain increased hip IR on L from 20 deg to 40 deg in order to return to fitness routines.    Time 12   Period Weeks   Status Achieved   PT LONG TERM GOAL  #2   Title Pt will demo increased hip ER/abd ( lat tib plateau to plinth from L/27 cm to L < 20 cm) in order to achieve sexual positions without pain.    Time 12   Period Weeks   Status Achieved   PT LONG TERM GOAL #3   Title Pt will be able to tie shoes without pain and with functional range of motion.    Time 12   Period Weeks   Status Achieved   PT LONG TERM GOAL #4   Title Pt will demo proper deep core activation without cuing Dynamic Stabilization 1-4 (5 reps each) in order to minimize relapse of Sx in pelvis and back and perform fitness routines.    Period Weeks   Status On-going   PT LONG TERM GOAL #5   Title Pt will report being able to tolerate sitting on hard surfaced chairs for > 30 min without shifting weight off ishial tuberosities.    Time 12   Period Weeks   Status Achieved   PT LONG TERM GOAL #6   Title Pt will report no leakage with coughing and no pain with sexual intercourse in order to perform her ADLs.   Status Achieved   PT LONG TERM GOAL #7   Title Pt will demo increased hip ER/abd ( lat tib plateau to plinth from L/25 cm to L 18 cm) and hip flexion from 90deg to 100 deg in order to achieve sexual positions without pain and to tie her shoe.    Time 12   Period Days   Status Achieved   PT LONG TERM GOAL #8   Title Pt will report an increase in improvement with numbness/tingling sensation on her L medial thigh at dermatome L2, L3 by reporting equal sensation to light touch in order to improve QOL.    Time 12   Period Weeks   Status Partially Met               Plan - 07/28/15 2134    Clinical Impression Statement  Pt has met 6/8 goals with return to tying her shoes and having sexual intercourse without pain. Pt has shown decreased fascial restrictions after manual Tx which has helped her to regain more sensation on the L medial thigh along L1, L2, and proximal half of L3  dermatomes. Pt will continue to benefit from manual Tx to continue decreasing  fascial restrictions. Pt will also benefit from  reassessment of the pelvic floor muscles as pt did report L deep pelvic pain with happy baby yoga pose today (end range of hip flexion and ER).  Pt is progressing well towards her other remaing goal related to deep core strengthening. Due to pt starting a new job with a schedule that will limit her from coming to further sessions at [facility], pt would like schedule 2x /week for this week and next week in order to optimize care.  This change is noted in the re-cert. Thank you again for your referral.  I will help pt find another  pelvic health PT near her job for pt to continue with the strengthening phase of her program in order to minimize risk for relapse and other complications related to hysterectomy.            Pt will benefit from skilled therapeutic intervention in order to improve on the following deficits Abnormal gait;Difficulty walking;Increased muscle spasms;Postural dysfunction;Improper body mechanics;Decreased mobility;Decreased activity tolerance;Decreased endurance;Decreased scar mobility;Increased fascial restricitons;Decreased coordination;Pain;Hypomobility;Decreased range of motion;Impaired flexibility;Decreased strength   Rehab Potential Good   Clinical Impairments Affecting Rehab Potential chronicity of pain, recent fall   PT Frequency 2x / week   PT Duration 2 weeks   PT Treatment/Interventions ADLs/Self Care Home Management;Aquatic Therapy;Stair training;Cryotherapy;Electrical Stimulation;Moist Heat;Traction;Balance training;Therapeutic exercise;Therapeutic activities;Functional mobility training;Manual techniques;Scar mobilization;Dry needling;Patient/family education;Biofeedback;Other (comment);Neuromuscular re-education   PT Next Visit Plan fascial releases L medial thigh   PT Home Exercise Plan deep core strengthening, reassess pelvic floor   Consulted and Agree with Plan of Care Patient        Problem List There are no active  problems to display for this patient.   Jerl Mina ,PT, DPT, E-RYT  07/28/2015, 9:50 PM  [place] MAIN [facility] SERVICES [address] [place], [place], [address] Phone: [phone]   Fax:  [phone]  Name: RASHEKA DENARD MRN: [id number] Date of Birth: 1965/12/13

## 2015-07-30 ENCOUNTER — Ambulatory Visit: Payer: Managed Care, Other (non HMO)

## 2015-08-04 ENCOUNTER — Ambulatory Visit: Payer: Managed Care, Other (non HMO) | Admitting: Physical Therapy

## 2015-08-06 ENCOUNTER — Ambulatory Visit: Payer: Managed Care, Other (non HMO) | Admitting: Physical Therapy

## 2015-08-06 DIAGNOSIS — Z7409 Other reduced mobility: Secondary | ICD-10-CM

## 2015-08-06 DIAGNOSIS — IMO0001 Reserved for inherently not codable concepts without codable children: Secondary | ICD-10-CM

## 2015-08-06 DIAGNOSIS — S39001S Unspecified injury of muscle, fascia and tendon of abdomen, sequela: Secondary | ICD-10-CM

## 2015-08-06 DIAGNOSIS — M791 Myalgia: Secondary | ICD-10-CM | POA: Diagnosis not present

## 2015-08-07 ENCOUNTER — Ambulatory Visit: Payer: Managed Care, Other (non HMO) | Admitting: Physical Therapy

## 2015-08-07 ENCOUNTER — Ambulatory Visit
Admission: RE | Admit: 2015-08-07 | Discharge: 2015-08-07 | Disposition: A | Discharge status: Home / Self Care | Payer: Managed Care, Other (non HMO) | Source: Ambulatory Visit | Attending: Family Medicine | Admitting: Family Medicine

## 2015-08-07 DIAGNOSIS — Z1231 Encounter for screening mammogram for malignant neoplasm of breast: Secondary | ICD-10-CM

## 2015-08-07 DIAGNOSIS — R922 Inconclusive mammogram: Secondary | ICD-10-CM | POA: Insufficient documentation

## 2015-08-07 DIAGNOSIS — Z7409 Other reduced mobility: Secondary | ICD-10-CM

## 2015-08-07 DIAGNOSIS — S39001S Unspecified injury of muscle, fascia and tendon of abdomen, sequela: Secondary | ICD-10-CM

## 2015-08-07 DIAGNOSIS — M791 Myalgia: Secondary | ICD-10-CM | POA: Diagnosis not present

## 2015-08-07 DIAGNOSIS — G5790 Unspecified mononeuropathy of unspecified lower limb: Secondary | ICD-10-CM | POA: Insufficient documentation

## 2015-08-07 DIAGNOSIS — IMO0001 Reserved for inherently not codable concepts without codable children: Secondary | ICD-10-CM

## 2015-08-07 NOTE — Therapy (Signed)
[place] MAIN [facility] SERVICES [address] [place], [place], [address] Phone: [phone]   Fax:  [phone]  Physical Therapy Treatment  Patient Details  Name: Felicia Barnett MRN: [id number] Date of Birth: 03/01/1966 Referring Provider: Otho Perl   Encounter Date: 08/06/2015      PT End of Session - 08/07/15 2259    Visit Number 14   Number of Visits 16   Date for PT Re-Evaluation 07/10/15   PT Start Time 1000   PT Stop Time 1100   PT Time Calculation (min) 60 min   Activity Tolerance Patient tolerated treatment well;No increased pain   Behavior During Therapy [facility] for tasks assessed/performed      Past Medical History  Diagnosis Date  . Hypertension   . Heart murmur     palpatiations  . Hyperlipidemia   . Diabetes mellitus without complication (HCC)     pre-diabetes  . Anxiety     since surgery, pt has had attacks w/ fear of not being able to move her leg (anxiety attacks occur  3-4x month.)    Past Surgical History  Procedure Laterality Date  . Abdominal hysterectomy    . Cardial ablation      anxiety attack (heart palpitations HR=210 bpm) the day she was released from the hospital from surgery. Findings were neg .    There were no vitals filed for this visit.  Visit Diagnosis:  Myalgia and myositis  Unspecified injury of muscle, fascia and tendon of abdomen, sequela  Impaired mobility      Subjective Assessment - 08/06/15 2253    Subjective Pt reports no increased pain after last session. Pt continues to feel decreased sensation over medial aspect of L knee.    Pertinent History cardiac ablation 2/2 anxiety attack (heart palpitations HR=210 bpm) the day she was released from the hospital. Hx of 2 c-sections, Currently experiencing constipation bowel movement every other day   How long can you stand comfortably? 45 min   Patient Stated Goals feel better, tie shoes, painfree sexual intercourse             [facility] PT Assessment - 08/07/15 2255    Sensation   Light Touch --  :L3 on L < R, post-Tx: (closer to medial border of patella)    Palpation   Palpation comment moderately restricted fascia along distal medial thigh (L3) on L, medial/posterior aspect of L thigh with fascial restriction with reported severe tenderness pre-Tx, post-Tx: decreased tenderness and fascial restrictions                      [facility] Adult PT Treatment/Exercise - 08/07/15 2257    Self-Care   Self-Care --  self-massage over L medial thigh   Exercises   Other Exercises  happy baby pose with strap, seated piriformis     Moist Heat Therapy   Number Minutes Moist Heat 10 Minutes   Moist Heat Location Other (comment)  L thigh   Manual Therapy   Myofascial Release fascial releases along L medial/posterior thigh   strumming, stripping, jostling                PT Education - 08/07/15 2301    Education provided Yes   Education Details HEP   Person(s) Educated Patient   Methods Explanation;Demonstration;Tactile cues;Verbal cues   Comprehension Verbalized understanding;Returned demonstration             PT Long Term Goals - 08/07/15 1048  PT LONG TERM GOAL #1   Title Pt will gain increased hip IR on L from 20 deg to 40 deg in order to return to fitness routines.    Time 12   Period Weeks   Status Achieved   PT LONG TERM GOAL #2   Title Pt will demo increased hip ER/abd ( lat tib plateau to plinth from L/27 cm to L < 20 cm) in order to achieve sexual positions without pain.    Time 12   Period Weeks   Status Achieved   PT LONG TERM GOAL #3   Title Pt will be able to tie shoes without pain and with functional range of motion.    Time 12   Period Weeks   Status Achieved   PT LONG TERM GOAL #4   Title Pt will demo proper deep core activation without cuing Dynamic Stabilization 1-4 (5 reps each) in order to minimize relapse of Sx in pelvis and back and perform fitness routines.     Period Weeks   Status Partially Met   PT LONG TERM GOAL #5   Title Pt will report being able to tolerate sitting on hard surfaced chairs for > 30 min without shifting weight off ishial tuberosities.    Time 12   Period Weeks   Status Achieved   PT LONG TERM GOAL #6   Title Pt will report no leakage with coughing and no pain with sexual intercourse in order to perform her ADLs.   Status Achieved   PT LONG TERM GOAL #7   Title Pt will demo increased hip ER/abd ( lat tib plateau to plinth from L/25 cm to L 18 cm) and hip flexion from 90deg to 100 deg in order to achieve sexual positions without pain and to tie her shoe.    Time 12   Period Days   Status Achieved   PT LONG TERM GOAL #8   Title Pt will report an increase in improvement with numbness/tingling sensation on her L medial thigh at dermatome L2, L3 by reporting equal sensation to light touch in order to improve QOL.    Time 12   Period Weeks   Status Achieved               Plan - 08/07/15 2300    Clinical Impression Statement Pt continues to respond well to fascial releases over L thigh. Pt has regained sensation medioposterior aspect of L thigh post-Tx.    Pt will benefit from skilled therapeutic intervention in order to improve on the following deficits Abnormal gait;Difficulty walking;Increased muscle spasms;Postural dysfunction;Improper body mechanics;Decreased mobility;Decreased activity tolerance;Decreased endurance;Decreased scar mobility;Increased fascial restricitons;Decreased coordination;Pain;Hypomobility;Decreased range of motion;Impaired flexibility;Decreased strength   Rehab Potential Good   Clinical Impairments Affecting Rehab Potential chronicity of pain, recent fall   PT Frequency 2x / week   PT Duration 2 weeks   PT Treatment/Interventions ADLs/Self Care Home Management;Aquatic Therapy;Stair training;Cryotherapy;Electrical Stimulation;Moist Heat;Traction;Balance training;Therapeutic exercise;Therapeutic  activities;Functional mobility training;Manual techniques;Scar mobilization;Dry needling;Patient/family education;Biofeedback;Other (comment);Neuromuscular re-education   PT Next Visit Plan fascial releases L medial thigh   PT Home Exercise Plan deep core strengthening, reassess pelvic floor   Consulted and Agree with Plan of Care Patient        Problem List There are no active problems to display for this patient.   Jerl Mina  ,PT, DPT, E-RYT  08/07/2015, 11:03 PM  [place] MAIN [facility] SERVICES [address] [place], [place], [address] Phone: [phone]  Fax:  [phone]  Name: Felicia Barnett MRN: [id number] Date of Birth: [birth year]

## 2015-08-07 NOTE — Therapy (Deleted)
[place] [facility] [address] [place], [place], [address] Phone: [phone]   Fax:  [phone]  Patient Details  Name: Lesia HausenValentina I Bartleson MRN: [id number] Date of Birth: 02/21/1966 Referring Provider:  Purvis SheffieldMoulder, Janelle K, MD  Encounter Date: 08/07/2015   Mariane MastersYeung,Shin Yiing  ,PT, DPT, E-RYT  08/07/2015, 11:04 PM  [place] [facility] [address] [place], [place], [address] Phone: [phone]   Fax:  [phone]

## 2015-08-07 NOTE — Patient Instructions (Signed)
1. Keep the fascia smoothed out using "The Stick" along L knee AND DO YOUR STRETCHES     2. Strengthening Deep core:    For 2 weeks, practice Level 1-2 30 reps twice a day  Next two weeks 3-4, only if you can do them without letting your hips rock or not let your back arch      You are now ready to begin training the deep core muscles system: diaphragm, transverse abdominis, pelvic floor . These muscles must work together as a team.           The key to these exercises to train the brain to coordinate the timing of these muscles and to have them turn on for long periods of time to hold you upright against gravity (especially important if you are on your feet all day).These muscles are postural muscles and play a role stabilizing your spine and bodyweight. By doing these repetitions slowly and correctly instead of doing crunches, you will achieve a flatter belly without a lower pooch. You are also placing your spine in a more neutral position and breathing properly which in turn, decreases your risk for problems related to your pelvic floor, abdominal, and low back such as pelvic organ prolapse, hernias, diastasis recti (separation of superficial muscles), disk herniations, spinal fractures. These exercises set a solid foundation for you to later progress to resistance/ strength training with therabands and weights and return to other typical fitness exercises with a stronger deeper core.

## 2015-08-13 ENCOUNTER — Other Ambulatory Visit: Payer: Self-pay | Admitting: Family Medicine

## 2015-08-13 DIAGNOSIS — R928 Other abnormal and inconclusive findings on diagnostic imaging of breast: Secondary | ICD-10-CM

## 2015-08-13 DIAGNOSIS — R921 Mammographic calcification found on diagnostic imaging of breast: Secondary | ICD-10-CM

## 2015-08-17 NOTE — Therapy (Addendum)
[place] [facility] MAIN [facility] SERVICES [address] [place], [place], [address] Phone: [phone]   Fax:  [phone]  Physical Therapy Treatment  Patient Details  Name: Felicia Barnett MRN: [id number] Date of Birth: [birth year] Referring Provider: Linna Hoff   Encounter Date: 08/07/2015      PT End of Session - 08/17/15 2130    Visit Number 14   Number of Visits 16   Date for PT Re-Evaluation 07/10/15   PT Start Time 1000   PT Stop Time 1100   PT Time Calculation (min) 60 min   Activity Tolerance Patient tolerated treatment well;No increased pain   Behavior During Therapy [facility] for tasks assessed/performed      Past Medical History  Diagnosis Date  . Hypertension   . Heart murmur     palpatiations  . Hyperlipidemia   . Diabetes mellitus without complication (HCC)     pre-diabetes  . Anxiety     since surgery, pt has had attacks w/ fear of not being able to move her leg (anxiety attacks occur  3-4x month.)    Past Surgical History  Procedure Laterality Date  . Abdominal hysterectomy    . Cardial ablation      anxiety attack (heart palpitations HR=210 bpm) the day she was released from the hospital from surgery. Findings were neg .    There were no vitals filed for this visit.  Visit Diagnosis:  Myalgia and myositis  Unspecified injury of muscle, fascia and tendon of abdomen, sequela  Impaired mobility      Subjective Assessment - 08/17/15 2122    Subjective Pt reports no increased pain after last session. Pt continues to feel decreased sensation over medial aspect of L knee. Pt will begin a new job next week and will not be able to continue PT. Pt will be f/u with MD re: Bartholin cysts and further diagnostic tests.    Pertinent History cardiac ablation 2/2 anxiety attack (heart palpitations HR=210 bpm) the day she was released from the hospital. Hx of 2 c-sections, Currently experiencing constipation bowel movement every other day    How long can you stand comfortably? 45 min   Patient Stated Goals feel better, tie shoes, painfree sexual intercourse   Currently in Pain? No/denies            [facility] PT Assessment - 08/17/15 2134    Observation/Other Assessments   Observations very slight swelling posterior area in labia minor  L  > R, coloring equal R =L      Sensation   Light Touch --  pre-Tx: decreased (medial to patella L), post-Tx:increased                     [facility] Adult PT Treatment/Exercise - 08/17/15 2124    Self-Care   Self-Care --  self-massage "The Stick", body position for best application   Exercises   Other Exercises  dynamic stabilization progression Level 1-4   Moist Heat Therapy   Moist Heat Location Other (comment)  L thigh   Manual Therapy   Myofascial Release fascial releases along L medial/posterior thigh   strumming, stripping, jostling                PT Education - 08/17/15 2129    Education provided Yes   Education Details HEP   Person(s) Educated Patient   Methods Explanation;Demonstration;Tactile cues;Verbal cues;Handout   Comprehension Verbalized understanding;Returned demonstration  PT Long Term Goals - 08/17/15 2129    PT LONG TERM GOAL #1   Title Pt will gain increased hip IR on L from 20 deg to 40 deg in order to return to fitness routines.    Time 12   Period Weeks   Status Achieved   PT LONG TERM GOAL #2   Title Pt will demo increased hip ER/abd ( lat tib plateau to plinth from L/27 cm to L < 20 cm) in order to achieve sexual positions without pain.    Time 12   Period Weeks   Status Achieved   PT LONG TERM GOAL #3   Title Pt will be able to tie shoes without pain and with functional range of motion.    Time 12   Period Weeks   Status Achieved   PT LONG TERM GOAL #4   Title Pt will demo proper deep core activation without cuing Dynamic Stabilization 1-4 (5 reps each) in order to minimize relapse of Sx in pelvis and back  and perform fitness routines.    Period Weeks   Status Achieved   PT LONG TERM GOAL #5   Title Pt will report being able to tolerate sitting on hard surfaced chairs for > 30 min without shifting weight off ishial tuberosities.    Time 12   Period Weeks   Status Achieved   PT LONG TERM GOAL #6   Title Pt will report no leakage with coughing and no pain with sexual intercourse in order to perform her ADLs.   Status Achieved   PT LONG TERM GOAL #7   Title Pt will demo increased hip ER/abd ( lat tib plateau to plinth from L/25 cm to L 18 cm) and hip flexion from 90deg to 100 deg in order to achieve sexual positions without pain and to tie her shoe.    Time 12   Period Days   Status Achieved   PT LONG TERM GOAL #8   Title Pt will report an increase in improvement with numbness/tingling sensation on her L medial thigh at dermatome L2, L3 by reporting equal sensation to light touch in order to improve QOL. ( minor area size of quarter remains to be numb over medial edge of patella)    Time 12   Period Weeks   Status Achieved               Plan - 08/17/15 2131    Clinical Impression Statement Pt has achieved 8/8 goals with increased hip mobility to sit,  tie shoes, and participate in sexual intercourse without pain. Pt also regained sensation to light touch along medioposterior L thigh (L1-3)  with positive response fascial releases. Pt has maintained equal symmetry in her SIJ and coccyx and decreased pelvic floor mm tensions with improved pelvic floor ROM.  Pt is self-charging due to starting a new job with a limited schedule. Pt was educated on maintaining/progressing  deep core strengthening exercises in order to optimize pelvic floor function. Pt was also educated on self-massage over fascial restrictions located over L thigh.      Pt will benefit from skilled therapeutic intervention in order to improve on the following deficits Abnormal gait;Difficulty walking;Increased muscle  spasms;Postural dysfunction;Improper body mechanics;Decreased mobility;Decreased activity tolerance;Decreased endurance;Decreased scar mobility;Increased fascial restricitons;Decreased coordination;Pain;Hypomobility;Decreased range of motion;Impaired flexibility;Decreased strength   Rehab Potential Good   Clinical Impairments Affecting Rehab Potential chronicity of pain, recent fall   PT Frequency 2x / week   PT Duration  2 weeks   PT Treatment/Interventions ADLs/Self Care Home Management;Aquatic Therapy;Stair training;Cryotherapy;Electrical Stimulation;Moist Heat;Traction;Balance training;Therapeutic exercise;Therapeutic activities;Functional mobility training;Manual techniques;Scar mobilization;Dry needling;Patient/family education;Biofeedback;Other (comment);Neuromuscular re-education   PT Next Visit Plan fascial releases L medial thigh   PT Home Exercise Plan deep core strengthening, reassess pelvic floor   Consulted and Agree with Plan of Care Patient        Problem List There are no active problems to display for this patient.   Mariane Masters ,PT, DPT, E-RYT  08/17/2015, 9:39 PM  [place] [facility] MAIN [facility] SERVICES [address] [place], [place], [address] Phone: [phone]   Fax:  [phone]  Name: Felicia Barnett MRN: [id number] Date of Birth: Feb 29, 1966

## 2015-08-18 ENCOUNTER — Encounter: Payer: Self-pay | Admitting: Obstetrics and Gynecology

## 2015-08-21 ENCOUNTER — Ambulatory Visit
Admission: RE | Admit: 2015-08-21 | Discharge: 2015-08-21 | Disposition: A | Discharge status: Home / Self Care | Payer: Managed Care, Other (non HMO) | Source: Ambulatory Visit | Attending: Family Medicine | Admitting: Family Medicine

## 2015-08-21 DIAGNOSIS — R921 Mammographic calcification found on diagnostic imaging of breast: Secondary | ICD-10-CM

## 2015-08-21 DIAGNOSIS — R928 Other abnormal and inconclusive findings on diagnostic imaging of breast: Secondary | ICD-10-CM

## 2015-08-21 DIAGNOSIS — N6002 Solitary cyst of left breast: Secondary | ICD-10-CM | POA: Diagnosis not present

## 2015-10-06 ENCOUNTER — Other Ambulatory Visit: Payer: Self-pay | Admitting: Family Medicine

## 2015-10-06 DIAGNOSIS — L989 Disorder of the skin and subcutaneous tissue, unspecified: Secondary | ICD-10-CM

## 2015-10-09 ENCOUNTER — Ambulatory Visit
Admission: RE | Admit: 2015-10-09 | Discharge: 2015-10-09 | Disposition: A | Discharge status: Home / Self Care | Payer: 59 | Source: Ambulatory Visit | Attending: Family Medicine | Admitting: Family Medicine

## 2015-10-09 DIAGNOSIS — L989 Disorder of the skin and subcutaneous tissue, unspecified: Secondary | ICD-10-CM

## 2015-12-18 ENCOUNTER — Encounter (HOSPITAL_COMMUNITY): Payer: Self-pay | Admitting: Vascular Surgery

## 2015-12-18 ENCOUNTER — Emergency Department (HOSPITAL_COMMUNITY): Payer: 59

## 2015-12-18 ENCOUNTER — Emergency Department (HOSPITAL_COMMUNITY)
Admission: EM | Admit: 2015-12-18 | Discharge: 2015-12-18 | Disposition: A | Discharge status: Home / Self Care | Payer: 59 | Attending: Emergency Medicine | Admitting: Emergency Medicine

## 2015-12-18 DIAGNOSIS — I10 Essential (primary) hypertension: Secondary | ICD-10-CM | POA: Insufficient documentation

## 2015-12-18 DIAGNOSIS — R42 Dizziness and giddiness: Secondary | ICD-10-CM | POA: Diagnosis not present

## 2015-12-18 DIAGNOSIS — R011 Cardiac murmur, unspecified: Secondary | ICD-10-CM | POA: Diagnosis not present

## 2015-12-18 DIAGNOSIS — R079 Chest pain, unspecified: Secondary | ICD-10-CM | POA: Diagnosis not present

## 2015-12-18 DIAGNOSIS — R002 Palpitations: Secondary | ICD-10-CM | POA: Diagnosis present

## 2015-12-18 DIAGNOSIS — E119 Type 2 diabetes mellitus without complications: Secondary | ICD-10-CM | POA: Diagnosis not present

## 2015-12-18 LAB — BASIC METABOLIC PANEL
Anion gap: 11 (ref 5–15)
BUN: 8 mg/dL (ref 6–20)
CHLORIDE: 99 mmol/L — AB (ref 101–111)
CO2: 28 mmol/L (ref 22–32)
CREATININE: 0.79 mg/dL (ref 0.44–1.00)
Calcium: 9.3 mg/dL (ref 8.9–10.3)
Glucose, Bld: 168 mg/dL — ABNORMAL HIGH (ref 65–99)
POTASSIUM: 3.1 mmol/L — AB (ref 3.5–5.1)
SODIUM: 138 mmol/L (ref 135–145)

## 2015-12-18 LAB — CBC
HCT: 37.2 % (ref 36.0–46.0)
HEMOGLOBIN: 12.5 g/dL (ref 12.0–15.0)
MCH: 29.6 pg (ref 26.0–34.0)
MCHC: 33.6 g/dL (ref 30.0–36.0)
MCV: 87.9 fL (ref 78.0–100.0)
PLATELETS: 219 10*3/uL (ref 150–400)
RBC: 4.23 MIL/uL (ref 3.87–5.11)
RDW: 13 % (ref 11.5–15.5)
WBC: 7.4 10*3/uL (ref 4.0–10.5)

## 2015-12-18 LAB — I-STAT TROPONIN, ED: TROPONIN I, POC: 0 ng/mL (ref 0.00–0.08)

## 2015-12-18 NOTE — ED Notes (Signed)
Pt reports to the ED for eval of sudden onset of lightheadedness, palpitations, and CP while she was having lunch. She got her BP taken and it was elevated so the RN told her to come here. Pt denies any SOB. Pt A&OX4, resp e/u, and skin warm and dry.

## 2016-01-18 ENCOUNTER — Other Ambulatory Visit: Payer: Self-pay | Admitting: Family Medicine

## 2016-01-18 DIAGNOSIS — N6002 Solitary cyst of left breast: Secondary | ICD-10-CM

## 2016-02-23 ENCOUNTER — Ambulatory Visit
Admission: RE | Admit: 2016-02-23 | Discharge: 2016-02-23 | Disposition: A | Discharge status: Home / Self Care | Payer: 59 | Source: Ambulatory Visit | Attending: Family Medicine | Admitting: Family Medicine

## 2016-02-23 DIAGNOSIS — N6002 Solitary cyst of left breast: Secondary | ICD-10-CM

## 2016-10-08 ENCOUNTER — Emergency Department: Payer: 59

## 2016-10-08 ENCOUNTER — Emergency Department
Admission: EM | Admit: 2016-10-08 | Discharge: 2016-10-08 | Disposition: A | Discharge status: Home / Self Care | Payer: 59 | Attending: Emergency Medicine | Admitting: Emergency Medicine

## 2016-10-08 DIAGNOSIS — Z7982 Long term (current) use of aspirin: Secondary | ICD-10-CM | POA: Insufficient documentation

## 2016-10-08 DIAGNOSIS — R002 Palpitations: Secondary | ICD-10-CM

## 2016-10-08 DIAGNOSIS — Z79899 Other long term (current) drug therapy: Secondary | ICD-10-CM | POA: Insufficient documentation

## 2016-10-08 DIAGNOSIS — E119 Type 2 diabetes mellitus without complications: Secondary | ICD-10-CM | POA: Diagnosis not present

## 2016-10-08 DIAGNOSIS — R079 Chest pain, unspecified: Secondary | ICD-10-CM

## 2016-10-08 DIAGNOSIS — Z7984 Long term (current) use of oral hypoglycemic drugs: Secondary | ICD-10-CM | POA: Insufficient documentation

## 2016-10-08 DIAGNOSIS — Z87891 Personal history of nicotine dependence: Secondary | ICD-10-CM | POA: Diagnosis not present

## 2016-10-08 DIAGNOSIS — I10 Essential (primary) hypertension: Secondary | ICD-10-CM | POA: Diagnosis not present

## 2016-10-08 DIAGNOSIS — E876 Hypokalemia: Secondary | ICD-10-CM | POA: Insufficient documentation

## 2016-10-08 LAB — CBC
HCT: 37.2 % (ref 35.0–47.0)
Hemoglobin: 12.9 g/dL (ref 12.0–16.0)
MCH: 31.1 pg (ref 26.0–34.0)
MCHC: 34.7 g/dL (ref 32.0–36.0)
MCV: 89.4 fL (ref 80.0–100.0)
PLATELETS: 218 10*3/uL (ref 150–440)
RBC: 4.16 MIL/uL (ref 3.80–5.20)
RDW: 13.3 % (ref 11.5–14.5)
WBC: 7.6 10*3/uL (ref 3.6–11.0)

## 2016-10-08 LAB — COMPREHENSIVE METABOLIC PANEL
ALT: 24 U/L (ref 14–54)
AST: 28 U/L (ref 15–41)
Albumin: 4 g/dL (ref 3.5–5.0)
Alkaline Phosphatase: 40 U/L (ref 38–126)
Anion gap: 9 (ref 5–15)
BUN: 16 mg/dL (ref 6–20)
CHLORIDE: 102 mmol/L (ref 101–111)
CO2: 27 mmol/L (ref 22–32)
CREATININE: 0.72 mg/dL (ref 0.44–1.00)
Calcium: 8.7 mg/dL — ABNORMAL LOW (ref 8.9–10.3)
GFR calc non Af Amer: >60 mL/min (ref >60)
Glucose, Bld: 107 mg/dL — ABNORMAL HIGH (ref 65–99)
POTASSIUM: 2.8 mmol/L — AB (ref 3.5–5.1)
SODIUM: 138 mmol/L (ref 135–145)
Total Bilirubin: 0.6 mg/dL (ref 0.3–1.2)
Total Protein: 7.3 g/dL (ref 6.5–8.1)

## 2016-10-08 LAB — TROPONIN I: Troponin I: <0.03 ng/mL (ref <0.03)

## 2016-10-08 MED ORDER — IBUPROFEN 600 MG PO TABS
600.0000 mg | ORAL_TABLET | Freq: Once | ORAL | Status: AC
  Administered 2016-10-08: 600 mg via ORAL
  Filled 2016-10-08: qty 1

## 2016-10-08 MED ORDER — POTASSIUM CHLORIDE CRYS ER 20 MEQ PO TBCR
40.0000 meq | EXTENDED_RELEASE_TABLET | Freq: Once | ORAL | Status: AC
  Administered 2016-10-08: 40 meq via ORAL
  Filled 2016-10-08: qty 2

## 2016-10-08 MED ORDER — IOPAMIDOL (ISOVUE-370) INJECTION 76%
100.0000 mL | Freq: Once | INTRAVENOUS | Status: AC | PRN
  Administered 2016-10-08: 100 mL via INTRAVENOUS

## 2016-10-08 NOTE — ED Notes (Signed)
Called EMS for transport to [facility]  [phone]

## 2016-10-08 NOTE — ED Triage Notes (Signed)
Pt states that she was awakened with chest pain this am over the left side of her chest radiating into the left arm. Pt describes it as a pinching or grabbing sensation, states that she has a history of palpitations and took an extra dose of her bystolic this am and that has reduced her heart rate at this time. She estimates it beating around 200 and then did the valsalva maneuver to assist in lowering her rate pta, no distress noted at this time

## 2016-10-08 NOTE — ED Notes (Signed)
Pt back in stretcher at this time.

## 2016-10-08 NOTE — ED Notes (Signed)
Pt up to bathroom.

## 2016-10-08 NOTE — ED Notes (Signed)

## 2016-10-08 NOTE — ED Notes (Signed)
Pt back from CT

## 2016-10-08 NOTE — ED Notes (Signed)
Pt taken to xray 

## 2016-10-08 NOTE — ED Notes (Addendum)
Patient transported to CT 

## 2016-10-08 NOTE — ED Notes (Signed)
Previous comment in error, patient is not to be transported to [facility]

## 2016-10-08 NOTE — ED Provider Notes (Addendum)
[facility] Emergency Department Provider Note ____________________________________________   I have reviewed the triage vital signs and the triage nursing note.  HISTORY  Chief Complaint Chest Pain   Historian Patient  HPI Felicia Barnett is a 50 y.o. female with a history of palpitations and what sounds like SVT by her description although she also states that several years ago she had an ablation with [facility]. Currently she follows with Dr. Darrold Junker. She states that she over the past holiday season, last few weeks she's been under increased stress. She does not report any additionalstimulants. Over the past few days she's had some episodes of palpitations without chest discomfort. This morning she woke up feeling moderate to severe chest pain and pressure over her left chest down her left arm and up to her left jaw. When she got up and went to the bathroom then she felt acute onset of heart racing and lightheadedness and felt like her heart was near 200 bpm. Although she typically takes her 2.5 mg by [place] at 1 PM and 4 PM typically, she went ahead and took an extra dose this morning.  Chest pain was severe at onset when she woke up, and is currently moderate at 5 out of 10. She states she she still feels a little bit anxious.  No focal weakness.  No coughing or fever. No trouble breathing or shortness of breath.  Heart racing has resolved.  The soreness over her left chest into her jaw and left arm is still there.    Past Medical History:  Diagnosis Date  . Anxiety    since surgery, pt has had attacks w/ fear of not being able to move her leg (anxiety attacks occur  3-4x month.)  . Diabetes mellitus without complication (HCC)    pre-diabetes  . Heart murmur    palpatiations  . Hyperlipidemia   . Hypertension     There are no active problems to display for this patient.   Past Surgical History:  Procedure Laterality Date  . ABDOMINAL  HYSTERECTOMY    . cardial ablation     anxiety attack (heart palpitations HR=210 bpm) the day she was released from the hospital from surgery. Findings were neg .    Prior to Admission medications   Medication Sig Start Date End Date Taking? Authorizing Provider  aspirin EC 81 MG tablet Take 81 mg by mouth daily as needed for moderate pain.   Yes Historical Provider, MD  chlorthalidone (HYGROTON) 25 MG tablet Take 25 mg by mouth daily.   Yes Historical Provider, MD  cholecalciferol (VITAMIN D) 1000 UNITS tablet Take 1,000 Units by mouth daily.   Yes Historical Provider, MD  diphenhydrAMINE (SOMINEX) 25 MG tablet Take 25 mg by mouth at bedtime as needed for sleep.   Yes Historical Provider, MD  famotidine (PEPCID) 20 MG tablet Take 20 mg by mouth 2 (two) times daily.   Yes Historical Provider, MD  nebivolol (BYSTOLIC) 5 MG tablet Take 2.5 mg by mouth 2 (two) times daily.    Yes Historical Provider, MD  simvastatin (ZOCOR) 10 MG tablet Take 10 mg by mouth daily.   Yes Historical Provider, MD  potassium chloride 20 MEQ TBCR Take 20 mEq by mouth daily. Patient not taking: Reported on 10/08/2016 06/08/15   Rebecka Apley, MD    No Known Allergies  Family History  Problem Relation Age of Onset  . Breast cancer Neg Hx     Social History Social History  Substance Use Topics  . Smoking status: Former Smoker    Years: 20.00    Quit date: 03/02/2009  . Smokeless tobacco: Never Used  . Alcohol use No    Review of Systems  Constitutional: Negative for fever. Eyes: Negative for visual changes. ENT: Negative for sore throat. Cardiovascular: Negative for pleuritic chest pain, chest pressure as per above. Respiratory: Negative for shortness of breath. Gastrointestinal: Negative for abdominal pain, vomiting and diarrhea. Genitourinary: Negative for dysuria. Musculoskeletal: Negative for back pain. Skin: Negative for rash. Neurological: Negative for headache. 10 point Review of Systems  otherwise negative ____________________________________________   PHYSICAL EXAM:  VITAL SIGNS: ED Triage Vitals  Enc Vitals Group     BP 10/08/16 0711 140/86     Pulse Rate 10/08/16 0711 95     Resp 10/08/16 0711 18     Temp 10/08/16 0711 97.9 F (36.6 C)     Temp Source 10/08/16 0711 Oral     SpO2 10/08/16 0711 98 %     Weight 10/08/16 0712 165 lb (74.8 kg)     Height 10/08/16 0712 5' (1.524 m)     Head Circumference --      Peak Flow --      Pain Score 10/08/16 0712 6     Pain Loc --      Pain Edu? --      Excl. in GC? --      Constitutional: Alert and oriented. Well appearing and in no distress.  Does appear somewhat anxious. HEENT   Head: Normocephalic and atraumatic.      Eyes: Conjunctivae are normal. PERRL. Normal extraocular movements.      Ears:         Nose: No congestion/rhinnorhea.   Mouth/Throat: Mucous membranes are moist.   Neck: No stridor. Cardiovascular/Chest: Normal rate, regular rhythm.  No murmurs, rubs, or gallops. Respiratory: Normal respiratory effort without tachypnea nor retractions. Breath sounds are clear and equal bilaterally. No wheezes/rales/rhonchi. Gastrointestinal: Soft. No distention, no guarding, no rebound. Nontender.    Genitourinary/rectal:Deferred Musculoskeletal: Nontender with normal range of motion in all extremities. No joint effusions.  No lower extremity tenderness.  No edema. Neurologic:  Normal speech and language. No gross or focal neurologic deficits are appreciated. Skin:  Skin is warm, dry and intact. No rash noted. Psychiatric: Mood and affect are normal. Speech and behavior are normal. Patient exhibits appropriate insight and judgment.   ____________________________________________  LABS (pertinent positives/negatives)  Labs Reviewed  COMPREHENSIVE METABOLIC PANEL - Abnormal; Notable for the following:       Result Value   Potassium 2.8 (*)    Glucose, Bld 107 (*)    Calcium 8.7 (*)    All other  components within normal limits  CBC  TROPONIN I  TROPONIN I    ____________________________________________    EKG I, Governor Rooksebecca Nathanal Hermiz, MD, the attending physician have personally viewed and interpreted all ECGs.  102 bpm. Sinus tachycardia. Narrow QRS. Normal axis. Nonspecific ST and T-wave. ____________________________________________  RADIOLOGY All Xrays were viewed by me. Imaging interpreted by Radiologist.  Chest x-ray two-view:  IMPRESSION: No acute cardiopulmonary abnormality.  CTA neck:  IMPRESSION: 1. Minimal carotid atherosclerosis. No carotid stenosis or dissection. 2. Both vertebral arteries are normal aside from mild irregularity with a beaded appearance at the C1 level which may be related to Fibromuscular Dysplasia (FMD). No vertebral artery dissection. 3. Chest CTA from today reported separately.  CTA chest:  Normal CT angiogram of the chest __________________________________________  PROCEDURES  Procedure(s) performed: None  Critical Care performed: None  ____________________________________________   ED COURSE / ASSESSMENT AND PLAN  Pertinent labs & imaging results that were available during my care of the patient were reviewed by me and considered in my medical decision making (see chart for details).   Felicia Barnett is here for evaluation of chest pain which was followed by episode of palpitations which resolved after Valsalva maneuver and taking an extra dose of her Bystolic.  She does however have some persistence of left-sided neck and arm soreness or pressure. Her initial EKG is reassuring. Her initial troponin is reassuring, we'll check a repeat level.  Given the persistence of pain into her neck, I discussed with her evaluation for dissection and will proceed.  Ultimately it sounds like she is not on any additional stimulants which would explain the increase in palpitations over the last few days, however she does report being under significant  stress and we discussed the importance of managing stress related especially given her cardiac condition.   Repeat troponin is reassuring and negative.  CT angiogram of the neck and chest are negative for any acute finding.  No recurrence of palpitations or arrhythmia, I will go ahead and discharge home today and she can follow-up with her primary doctor as well as her own cardiologist.    CONSULTATIONS:  None   Patient / Family / Caregiver informed of clinical course, medical decision-making process, and agree with plan.   I discussed return precautions, follow-up instructions, and discharge instructions with patient and/or family.  Addended to include discussion regarding hypokalemia and patient was given 1 dose of by mouth potassium here. It sounds like there is been discussion and change in her hydrochlorothiazide to chlorthalindone which is likely responsible.  I discussed with her stopping that, but she is nervous to do that although her blood pressures are very well controlled here. She is to speak with her cardiologist this week, call the office on Tuesday. ___________________________________________   FINAL CLINICAL IMPRESSION(S) / ED DIAGNOSES   Final diagnoses:  Nonspecific chest pain  Palpitations              Note: This dictation was prepared with Dragon dictation. Any transcriptional errors that result from this process are unintentional    Governor Rooksebecca Valisa Karpel, MD 10/08/16 1225    Governor Rooksebecca Hazaiah [place], MD 10/08/16 [phone]

## 2016-10-08 NOTE — Discharge Instructions (Signed)
You were evaluated for chest discomfort, although no certain cause was found, and your exam and evaluation are reassuring in the emergency department today.  I suspect that the episode of heart racing was probably SVT, but you do need to follow up with your cardiologist this week.  Return to the emergency department for any worsening chest discomfort, trouble breathing, fever, numbness, weakness, or any other symptoms concerning to you.

## 2016-10-08 NOTE — ED Notes (Signed)
Pt c/o CP that has been intermittent X 1 week. Pt c/o 10/10 CP that awoke her from sleep approx 630AM today. Described as "weird" and radiating from left side of chest down left arm. Pt reports she felt palpitations as well. Pt reports CP has gotten better but rates 7/10 at this time. Pt alert and oriented X4, active, cooperative, pt in NAD. RR even and unlabored, color WNL.  Pt ambulatory.

## 2016-10-18 ENCOUNTER — Ambulatory Visit: Payer: 59 | Attending: Otolaryngology

## 2016-10-18 DIAGNOSIS — G4733 Obstructive sleep apnea (adult) (pediatric): Secondary | ICD-10-CM | POA: Diagnosis not present

## 2016-10-18 DIAGNOSIS — R0681 Apnea, not elsewhere classified: Secondary | ICD-10-CM | POA: Diagnosis present

## 2016-10-21 ENCOUNTER — Ambulatory Visit: Payer: 59 | Attending: Otolaryngology

## 2016-10-21 DIAGNOSIS — G4733 Obstructive sleep apnea (adult) (pediatric): Secondary | ICD-10-CM | POA: Insufficient documentation

## 2016-10-31 ENCOUNTER — Ambulatory Visit: Admission: RE | Admit: 2016-10-31 | Payer: 59 | Source: Ambulatory Visit | Admitting: Gastroenterology

## 2016-10-31 ENCOUNTER — Encounter: Admission: RE | Payer: Self-pay | Source: Ambulatory Visit

## 2016-10-31 SURGERY — EGD (ESOPHAGOGASTRODUODENOSCOPY)
Anesthesia: General

## 2016-10-31 MED ORDER — LIDOCAINE HCL (PF) 2 % IJ SOLN
INTRAMUSCULAR | Status: AC
  Filled 2016-10-31: qty 2

## 2016-10-31 MED ORDER — MIDAZOLAM HCL 2 MG/2ML IJ SOLN
INTRAMUSCULAR | Status: AC
  Filled 2016-10-31: qty 2

## 2016-10-31 MED ORDER — FENTANYL CITRATE (PF) 100 MCG/2ML IJ SOLN
INTRAMUSCULAR | Status: AC
  Filled 2016-10-31: qty 2

## 2016-10-31 MED ORDER — PROPOFOL 500 MG/50ML IV EMUL
INTRAVENOUS | Status: AC
  Filled 2016-10-31: qty 50

## 2016-11-22 ENCOUNTER — Encounter (HOSPITAL_COMMUNITY): Payer: Self-pay | Admitting: Emergency Medicine

## 2016-11-22 ENCOUNTER — Emergency Department (HOSPITAL_COMMUNITY): Payer: 59

## 2016-11-22 ENCOUNTER — Emergency Department (HOSPITAL_COMMUNITY)
Admission: EM | Admit: 2016-11-22 | Discharge: 2016-11-22 | Disposition: A | Discharge status: Home / Self Care | Payer: 59 | Attending: Emergency Medicine | Admitting: Emergency Medicine

## 2016-11-22 DIAGNOSIS — Z7982 Long term (current) use of aspirin: Secondary | ICD-10-CM | POA: Diagnosis not present

## 2016-11-22 DIAGNOSIS — I10 Essential (primary) hypertension: Secondary | ICD-10-CM | POA: Diagnosis not present

## 2016-11-22 DIAGNOSIS — E119 Type 2 diabetes mellitus without complications: Secondary | ICD-10-CM | POA: Diagnosis not present

## 2016-11-22 DIAGNOSIS — R519 Headache, unspecified: Secondary | ICD-10-CM

## 2016-11-22 DIAGNOSIS — R51 Headache: Secondary | ICD-10-CM | POA: Insufficient documentation

## 2016-11-22 DIAGNOSIS — Z79899 Other long term (current) drug therapy: Secondary | ICD-10-CM | POA: Insufficient documentation

## 2016-11-22 DIAGNOSIS — Z87891 Personal history of nicotine dependence: Secondary | ICD-10-CM | POA: Insufficient documentation

## 2016-11-22 LAB — I-STAT CHEM 8, ED
BUN: 14 mg/dL (ref 6–20)
CALCIUM ION: 1.12 mmol/L — AB (ref 1.15–1.40)
Chloride: 101 mmol/L (ref 101–111)
Creatinine, Ser: 0.8 mg/dL (ref 0.44–1.00)
GLUCOSE: 99 mg/dL (ref 65–99)
HEMATOCRIT: 38 % (ref 36.0–46.0)
HEMOGLOBIN: 12.9 g/dL (ref 12.0–15.0)
Potassium: 3.4 mmol/L — ABNORMAL LOW (ref 3.5–5.1)
Sodium: 140 mmol/L (ref 135–145)
TCO2: 26 mmol/L (ref 0–100)

## 2016-11-22 MED ORDER — KETOROLAC TROMETHAMINE 15 MG/ML IJ SOLN
15.0000 mg | Freq: Once | INTRAMUSCULAR | Status: AC
  Administered 2016-11-22: 15 mg via INTRAVENOUS
  Filled 2016-11-22: qty 1

## 2016-11-22 MED ORDER — SODIUM CHLORIDE 0.9 % IV BOLUS (SEPSIS)
1000.0000 mL | Freq: Once | INTRAVENOUS | Status: AC
Start: 2016-11-22 — End: 2016-11-22
  Administered 2016-11-22: 1000 mL via INTRAVENOUS

## 2016-11-22 MED ORDER — DIPHENHYDRAMINE HCL 50 MG/ML IJ SOLN
25.0000 mg | Freq: Once | INTRAMUSCULAR | Status: AC
  Administered 2016-11-22: 25 mg via INTRAVENOUS
  Filled 2016-11-22: qty 1

## 2016-11-22 MED ORDER — METOCLOPRAMIDE HCL 5 MG/ML IJ SOLN
10.0000 mg | Freq: Once | INTRAMUSCULAR | Status: AC
  Administered 2016-11-22: 10 mg via INTRAVENOUS
  Filled 2016-11-22: qty 2

## 2016-11-22 NOTE — ED Notes (Signed)
Pt stable, understands discharge instructions, and reasons for return.   

## 2016-11-22 NOTE — ED Provider Notes (Signed)
[facility]-EMERGENCY DEPT Provider Note   CSN: [id number] Arrival date & time: 11/22/16  1109     History   Chief Complaint Chief Complaint  Patient presents with  . Headache  . Tachycardia    HPI Felicia HausenValentina I Barnett is a 51 y.o. female.  Patient presents to the emergency department with chief complaint of headache. She states that she began having a headache last night that radiates to the right side of her head and right side of her neck. She reports associated numbness and tingling sensation. She denies any vision changes, or slurred speech. Denies any weakness, numbness, or tingling of her extremities. She states that the headache worsened today after she experienced an episode of suspected SVT, for which the patient treated herself with Valsalva maneuver which resolved the SVT sensation. Patient states that she also takes Bystolic and had an ablation in the past.  She states that at the end of December she had CT angiograms of her chest and neck. There are no other associated symptoms. She has not taken any medication for her headache.   The history is provided by the patient. No language interpreter was used (speaks [place] well).    Past Medical History:  Diagnosis Date  . Anxiety    since surgery, pt has had attacks w/ fear of not being able to move her leg (anxiety attacks occur  3-4x month.)  . Diabetes mellitus without complication (HCC)    pre-diabetes  . Heart murmur    palpatiations  . Hyperlipidemia   . Hypertension     There are no active problems to display for this patient.   Past Surgical History:  Procedure Laterality Date  . ABDOMINAL HYSTERECTOMY    . cardial ablation     anxiety attack (heart palpitations HR=210 bpm) the day she was released from the hospital from surgery. Findings were neg .    OB History    No data available       Home Medications    Prior to Admission medications   Medication Sig Start Date End Date Taking? Authorizing  Provider  aspirin EC 81 MG tablet Take 81 mg by mouth daily as needed for moderate pain.   Yes Historical Provider, MD  chlorthalidone (HYGROTON) 25 MG tablet Take 25 mg by mouth daily.   Yes Historical Provider, MD  cholecalciferol (VITAMIN D) 1000 UNITS tablet Take 1,000 Units by mouth daily.   Yes Historical Provider, MD  diphenhydrAMINE (SOMINEX) 25 MG tablet Take 25 mg by mouth at bedtime as needed for sleep.   Yes Historical Provider, MD  famotidine (PEPCID) 20 MG tablet Take 20 mg by mouth 2 (two) times daily.   Yes Historical Provider, MD  gabapentin (NEURONTIN) 300 MG capsule Take 300 mg by mouth 3 (three) times daily.   Yes Historical Provider, MD  nebivolol (BYSTOLIC) 5 MG tablet Take 2.5 mg by mouth 2 (two) times daily.    Yes Historical Provider, MD  simvastatin (ZOCOR) 10 MG tablet Take 10 mg by mouth daily.   Yes Historical Provider, MD  omeprazole (PRILOSEC) 20 MG capsule Take 20 mg by mouth daily.    Historical Provider, MD  potassium chloride 20 MEQ TBCR Take 20 mEq by mouth daily. Patient not taking: Reported on 10/08/2016 06/08/15   Rebecka ApleyAllison P Webster, MD  valACYclovir (VALTREX) 500 MG tablet Take 500 mg by mouth 2 (two) times daily.    Historical Provider, MD    Family History Family History  Problem Relation Age  of Onset  . Breast cancer Neg Hx     Social History Social History  Substance Use Topics  . Smoking status: Former Smoker    Years: 20.00    Quit date: 03/02/2009  . Smokeless tobacco: Never Used  . Alcohol use No     Allergies   Patient has no known allergies.   Review of Systems Review of Systems  Constitutional: Negative for chills and fever.  Respiratory: Negative for shortness of breath.   Cardiovascular: Negative for chest pain.  Gastrointestinal: Negative for constipation, diarrhea, nausea and vomiting.  Genitourinary: Negative for dysuria.  Neurological: Positive for headaches.     Physical Exam Updated Vital Signs BP 139/84   Pulse  62   Temp 98.4 F (36.9 C) (Oral)   Resp 18   SpO2 97%   Physical Exam  Constitutional: She is oriented to person, place, and time. She appears well-developed and well-nourished.  HENT:  Head: Normocephalic and atraumatic.  Right Ear: External ear normal.  Left Ear: External ear normal.  Eyes: Conjunctivae and EOM are normal. Pupils are equal, round, and reactive to light.  Neck: Normal range of motion. Neck supple.  No pain with neck flexion, no meningismus  Cardiovascular: Normal rate, regular rhythm and normal heart sounds.  Exam reveals no gallop and no friction rub.   No murmur heard. Pulmonary/Chest: Effort normal and breath sounds normal. No respiratory distress. She has no wheezes. She has no rales. She exhibits no tenderness.  Abdominal: Soft. She exhibits no distension and no mass. There is no tenderness. There is no rebound and no guarding.  Musculoskeletal: Normal range of motion. She exhibits no edema or tenderness.  Normal gait.  Neurological: She is alert and oriented to person, place, and time. She has normal reflexes.  CN 3-12 intact, normal finger to nose, no pronator drift, sensation and strength intact bilaterally.  Skin: Skin is warm and dry.  Psychiatric: She has a normal mood and affect. Her behavior is normal. Judgment and thought content normal.  Nursing note and vitals reviewed.    ED Treatments / Results  Labs (all labs ordered are listed, but only abnormal results are displayed) Labs Reviewed - No data to display  EKG  EKG Interpretation None       Radiology No results found.  Procedures Procedures (including critical care time)  Medications Ordered in ED Medications - No data to display   Initial Impression / Assessment and Plan / ED Course  I have reviewed the triage vital signs and the nursing notes.  Pertinent labs & imaging results that were available during my care of the patient were reviewed by me and considered in my medical  decision making (see chart for details).     Patient with headache and right head numbness that started last night. No neurologic deficit on exam. Will check CT head. Additionally, patient reports having a brief episode of SVT earlier which resolved with Valsalva.  Normal heart rate during ED stay. CT head is negative. I suspect the patient's tingling sensation is related to complex migraine. The symptoms resolved completely with the headache. The patient is neurovascularly intact. She is stable and ready for discharge.  Final Clinical Impressions(s) / ED Diagnoses   Final diagnoses:  Nonintractable headache, unspecified chronicity pattern, unspecified headache type    New Prescriptions New Prescriptions   No medications on file     Roxy Horseman, PA-C 11/22/16 1507    Azalia Bilis, MD 11/23/16 1718

## 2016-11-22 NOTE — ED Triage Notes (Signed)
[facility] new onset R sided HA which started after performing vagal maneuvers for SVT. NSR at this time. Pt ambulatory. NAD noted at this time.

## 2016-11-22 NOTE — ED Notes (Signed)
Patient transported to CT 

## 2017-01-31 ENCOUNTER — Encounter: Payer: Self-pay | Admitting: Emergency Medicine

## 2017-01-31 ENCOUNTER — Emergency Department
Admission: EM | Admit: 2017-01-31 | Discharge: 2017-02-01 | Disposition: A | Discharge status: Home / Self Care | Payer: 59 | Attending: Emergency Medicine | Admitting: Emergency Medicine

## 2017-01-31 ENCOUNTER — Emergency Department: Payer: 59

## 2017-01-31 DIAGNOSIS — E119 Type 2 diabetes mellitus without complications: Secondary | ICD-10-CM | POA: Diagnosis not present

## 2017-01-31 DIAGNOSIS — I10 Essential (primary) hypertension: Secondary | ICD-10-CM | POA: Insufficient documentation

## 2017-01-31 DIAGNOSIS — R51 Headache: Secondary | ICD-10-CM | POA: Insufficient documentation

## 2017-01-31 DIAGNOSIS — Z87891 Personal history of nicotine dependence: Secondary | ICD-10-CM | POA: Diagnosis not present

## 2017-01-31 DIAGNOSIS — R519 Headache, unspecified: Secondary | ICD-10-CM

## 2017-01-31 DIAGNOSIS — Z79899 Other long term (current) drug therapy: Secondary | ICD-10-CM | POA: Insufficient documentation

## 2017-01-31 DIAGNOSIS — R202 Paresthesia of skin: Secondary | ICD-10-CM | POA: Insufficient documentation

## 2017-01-31 DIAGNOSIS — R0789 Other chest pain: Secondary | ICD-10-CM | POA: Insufficient documentation

## 2017-01-31 DIAGNOSIS — Z7982 Long term (current) use of aspirin: Secondary | ICD-10-CM | POA: Diagnosis not present

## 2017-01-31 LAB — CBC
HCT: 36 % (ref 35.0–47.0)
Hemoglobin: 12.7 g/dL (ref 12.0–16.0)
MCH: 31.1 pg (ref 26.0–34.0)
MCHC: 35.4 g/dL (ref 32.0–36.0)
MCV: 87.9 fL (ref 80.0–100.0)
PLATELETS: 272 10*3/uL (ref 150–440)
RBC: 4.09 MIL/uL (ref 3.80–5.20)
RDW: 13.2 % (ref 11.5–14.5)
WBC: 6.3 10*3/uL (ref 3.6–11.0)

## 2017-01-31 NOTE — ED Notes (Addendum)
Dr. McShane at the bedside for pt evaluation 

## 2017-01-31 NOTE — ED Provider Notes (Signed)
[facility] Emergency Department Provider Note  ____________________________________________   I have reviewed the triage vital signs and the nursing notes.   HISTORY  Chief Complaint Chest Pain    HPI Felicia Barnett is a 51 y.o. female with a history of anxiety, panic attacks, sinus tachycardia, possible SVT, negative recent Holter monitor, negative recent CT head, negative recent CTA of the chest and neck,palpitations, diabetes, panic attacks about her health, chronic recurrent headaches states that she has had multiple different complaints today. She states that her left chest has felt "weird" for 2 weeks. She is going to follow up with her cardiologist. Is not exertional. Feels like she pulled a muscle in her left chest wall. Gradual in onset. Not worst pain of life, not exertional, not pleuritic, she states that it hurts when she touches it or changes position she thinks is a muscle pain and feels "weird" it has been there nonstop for 2 weeks and she would like to get that checked out. Also, today, she was worried about that and she began to have a headache. Headache was not the worst headache of life. Consistent with multiple prior headaches. She did had palpitations. Palpitations are again similar to multiple prior palpitation events, and she began to feel tingling but only in the right side of her for head. She did not have any tingling anywhere else. Just above her right eyebrow essentially a very small area. That is completely resolved. No other focal numbness or weakness. No difficulty speaking or talking or seeing. Patient states at this time the only complaint she has is that her left chest wall feels "a little weird. When she was having this event, she was very anxious she states. She also checked her blood pressure and found to be quite elevated and for this reason she came to the emergency department. Denies Travel or personal or family history of PE or DVT  no leg swelling especially no unilateral leg swelling denies taking estrogens, no pleuritic chest pain no recent surgery.     Past Medical History:  Diagnosis Date  . Anxiety    since surgery, pt has had attacks w/ fear of not being able to move her leg (anxiety attacks occur  3-4x month.)  . Diabetes mellitus without complication (HCC)    pre-diabetes  . Heart murmur    palpatiations  . Hyperlipidemia   . Hypertension     There are no active problems to display for this patient.   Past Surgical History:  Procedure Laterality Date  . ABDOMINAL HYSTERECTOMY    . cardial ablation     anxiety attack (heart palpitations HR=210 bpm) the day she was released from the hospital from surgery. Findings were neg .    Prior to Admission medications   Medication Sig Start Date End Date Taking? Authorizing Provider  aspirin EC 81 MG tablet Take 81 mg by mouth daily as needed for moderate pain.    Historical Provider, MD  chlorthalidone (HYGROTON) 25 MG tablet Take 25 mg by mouth daily.    Historical Provider, MD  cholecalciferol (VITAMIN D) 1000 UNITS tablet Take 1,000 Units by mouth daily.    Historical Provider, MD  diphenhydrAMINE (SOMINEX) 25 MG tablet Take 25 mg by mouth at bedtime as needed for sleep.    Historical Provider, MD  famotidine (PEPCID) 20 MG tablet Take 20 mg by mouth 2 (two) times daily.    Historical Provider, MD  gabapentin (NEURONTIN) 300 MG capsule Take 300 mg  by mouth 3 (three) times daily.    Historical Provider, MD  nebivolol (BYSTOLIC) 5 MG tablet Take 2.5 mg by mouth 2 (two) times daily.     Historical Provider, MD  omeprazole (PRILOSEC) 20 MG capsule Take 20 mg by mouth daily.    Historical Provider, MD  potassium chloride 20 MEQ TBCR Take 20 mEq by mouth daily. Patient not taking: Reported on 10/08/2016 06/08/15   Rebecka Apley, MD  simvastatin (ZOCOR) 10 MG tablet Take 10 mg by mouth daily.    Historical Provider, MD  valACYclovir (VALTREX) 500 MG tablet  Take 500 mg by mouth 2 (two) times daily.    Historical Provider, MD    Allergies Patient has no known allergies.  Family History  Problem Relation Age of Onset  . Breast cancer Neg Hx     Social History Social History  Substance Use Topics  . Smoking status: Former Smoker    Years: 20.00    Quit date: 03/02/2009  . Smokeless tobacco: Never Used  . Alcohol use Yes     Comment: occ    Review of Systems Constitutional: No fever/chills Eyes: No visual changes. ENT: No sore throat. No stiff neck no neck pain Cardiovascular: He has very guarding chest pain. Respiratory: See history of present illness but denies shortness of breath. Gastrointestinal:   no vomiting.  No diarrhea.  No constipation. Genitourinary: Negative for dysuria. Musculoskeletal: Negative lower extremity swelling Skin: Negative for rash. Neurological: Negative for severe headaches, see history of present illness regarding her tingling above her eyebrow10-point ROS otherwise negative.  ____________________________________________   PHYSICAL EXAM:  VITAL SIGNS: ED Triage Vitals  Enc Vitals Group     BP 01/31/17 2245 (!) 148/83     Pulse Rate 01/31/17 2245 75     Resp 01/31/17 2245 18     Temp 01/31/17 2245 98 F (36.7 C)     Temp Source 01/31/17 2245 Oral     SpO2 01/31/17 2245 98 %     Weight 01/31/17 2239 174 lb (78.9 kg)     Height 01/31/17 2239  (1.549 m)     Head Circumference --      Peak Flow --      Pain Score 01/31/17 2239 6     Pain Loc --      Pain Edu? --      Excl. in GC? --     Constitutional: Alert and oriented. Well appearing and in no acute distress. Eyes: Conjunctivae are normal. PERRL. EOMI. Head: Atraumatic. Nose: No congestion/rhinnorhea. Mouth/Throat: Mucous membranes are moist.  Oropharynx non-erythematous. Neck: No stridor.   Nontender with no meningismus Cardiovascular: Normal rate, regular rhythm. Grossly normal heart sounds.  Good peripheral circulation. S,  female nurse present for chaperone, patient would reproduce with chest wall pain pectoralis muscle. No shingles lesions noted, no inflammation, not erythematous not warm or hot to touch, no fluctuance no mass no abscess no rib fracture no flail chest. When I touch this area patient states "that's the pain right there" and pulls back. Respiratory: Normal respiratory effort.  No retractions. Lungs CTAB. Abdominal: Soft and nontender. No distention. No guarding no rebound Back:  There is no focal tenderness or step off.  there is no midline tenderness there are no lesions noted. there is no CVA tenderness Musculoskeletal: No lower extremity tenderness, no upper extremity tenderness. No joint effusions, no DVT signs strong distal pulses no edema Neurologic:  Cranial nerves II through XII are grossly  intact 5 out of 5 strength bilateral upper and lower extremity. Finger to nose within normal limits heel to shin within normal limits, speech is normal with no word finding difficulty or dysarthria, reflexes symmetric, pupils are equally round and reactive to light, there is no pronator drift, sensation is normal, vision is intact to confrontation, gait is deferred, there is no nystagmus, normal neurologic exam NI H stroke scale 0 Skin:  Skin is warm, dry and intact. No rash noted. Psychiatric: Mood and affect are anxious. Speech and behavior are normal.  ____________________________________________   LABS (all labs ordered are listed, but only abnormal results are displayed)  Labs Reviewed  BASIC METABOLIC PANEL  CBC  TROPONIN I   ____________________________________________  EKG  I personally interpreted any EKGs ordered by me or triage Sinus rhythm rate 71 bpm no acute ST elevation no acute ST depression, borderline LAD, no acute ischemic changes. ____________________________________________  RADIOLOGY  I reviewed any imaging ordered by me or triage that were performed during my shift and, if  possible, patient and/or family made aware of any abnormal findings. ____________________________________________   PROCEDURES  Procedure(s) performed: None  Procedures  Critical Care performed: None  ____________________________________________   INITIAL IMPRESSION / ASSESSMENT AND PLAN / ED COURSE  Pertinent labs & imaging results that were available during my care of the patient were reviewed by me and considered in my medical decision making (see chart for details).  Today with multiple complaints. The first is 2 weeks of chest discomfort which she describes as "a weird feeling in my muscle". At this time, there does not appear to be clinical evidence to support the diagnosis of pulmonary embolus, dissection, myocarditis, endocarditis, pericarditis, pericardial tamponade, acute coronary syndrome, pneumothorax, pneumonia, or any other acute intrathoracic pathology that will require admission or acute intervention. Nor is there evidence of any significant intra-abdominal pathology causing this discomfort.  The second is that her blood pressure was elevated. This is likely because of anxiety. Doubt pheochromocytoma acute thyroid storm etc. Has come down well with relaxation. Patient with a history of panic attacks.   Patient had tingling above the right eye. She has had multiple CT scans of her head for various headache related complaints. She also had a headache today not worst headache of life. Headache is gone. NIH stroke scale is 0. We discussed CT scan, I don't believe the patient was having a stroke, and I don't think therefore that there is any indication for acute imaging however I did discuss the option whether is it is clearly no way I can definitively rule it out even of the history does not seem consistent with a stroke and her stroke scale is 0. Patient states he actually does not want another CAT scan. She is concerned about possible radiation. I don't think this is  unreasonable. I don't think she needs to be admitted to the hospital have an emergent neurologic consult for tingling of her arrival which is completely resolved. Essentially, her workup here is very reassuring she has excellent outpatient follow-up and we will have her return to the emergency room for any new or worrisome symptoms. Patient very comfortable with this. She has been very anxious tonight she states but she denies any SI or HI think is safe for outpatient discharge does not wish any further counseling in that regard.       ____________________________________________   FINAL CLINICAL IMPRESSION(S) / ED DIAGNOSES  Final diagnoses:  None      This chart  was dictated using voice recognition software.  Despite best efforts to proofread,  errors can occur which can change meaning.      Jeanmarie Plant, MD 02/01/17 [facility] Manners

## 2017-01-31 NOTE — ED Notes (Signed)
Patient transported to x-ray. ?

## 2017-01-31 NOTE — ED Triage Notes (Signed)
Patient with complaint of left side chest pain, palpatations, headache with right side facial numbness and elevated bp that started around 21:00 tonight. Patient states that the facial numbness has resolved at this time. Patient reports that he bp was 186/106 at home.

## 2017-02-01 LAB — BASIC METABOLIC PANEL
Anion gap: 6 (ref 5–15)
BUN: 12 mg/dL (ref 6–20)
CO2: 31 mmol/L (ref 22–32)
CREATININE: 0.64 mg/dL (ref 0.44–1.00)
Calcium: 9 mg/dL (ref 8.9–10.3)
Chloride: 100 mmol/L — ABNORMAL LOW (ref 101–111)
GFR calc Af Amer: >60 mL/min (ref >60)
GFR calc non Af Amer: >60 mL/min (ref >60)
Glucose, Bld: 108 mg/dL — ABNORMAL HIGH (ref 65–99)
Potassium: 3 mmol/L — ABNORMAL LOW (ref 3.5–5.1)
Sodium: 137 mmol/L (ref 135–145)

## 2017-02-01 LAB — TROPONIN I: Troponin I: <0.03 ng/mL (ref <0.03)

## 2017-02-01 MED ORDER — POTASSIUM CHLORIDE CRYS ER 20 MEQ PO TBCR
40.0000 meq | EXTENDED_RELEASE_TABLET | Freq: Once | ORAL | Status: AC
  Administered 2017-02-01: 40 meq via ORAL

## 2017-02-01 NOTE — Discharge Instructions (Signed)
If you have any new or worrisome symptoms including worsening chest pain shortness of breath difficulty walking numbness or weakness severe headache that is different from normal, headache that lasts longer than normal or any neurologic signs, if you have any other concerns please return to the emergency room otherwise follow closely with primary care doctor

## 2017-02-07 ENCOUNTER — Other Ambulatory Visit: Payer: Self-pay | Admitting: Family Medicine

## 2017-02-07 DIAGNOSIS — Z1231 Encounter for screening mammogram for malignant neoplasm of breast: Secondary | ICD-10-CM

## 2017-02-26 IMAGING — CT CT HEAD W/O CM
4 series · 16 of 47 positions shown, 18 images · non-contrast
Comparison: 10/08/2014

CLINICAL DATA: Right side headache, numbness for 2 days.

EXAM:
CT HEAD WITHOUT CONTRAST
TECHNIQUE: Contiguous axial images were obtained from the base of the skull
through the vertex without intravenous contrast.

[Series 2: head without · axial · non-contrast · 0.43mm/px · z∈[-35,+85]mm · 7 of 32 slices shown, 9 images]
[im 4/32  brain]
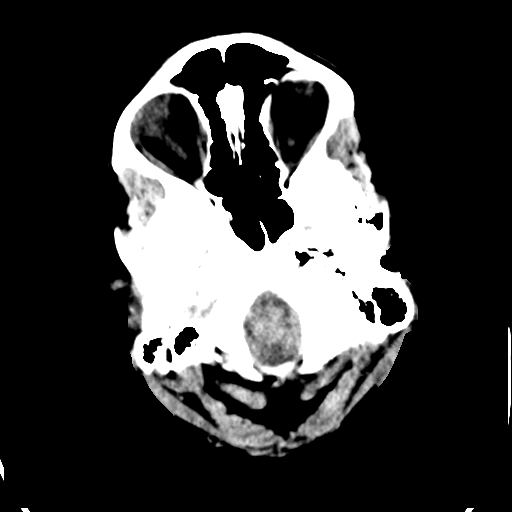
[im 4/32  bone]
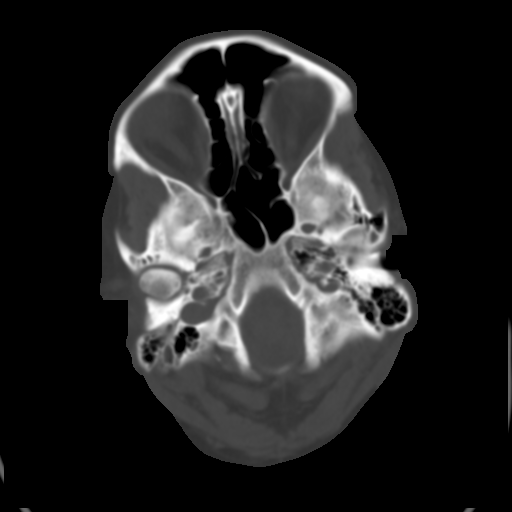
[im 8/32  brain]
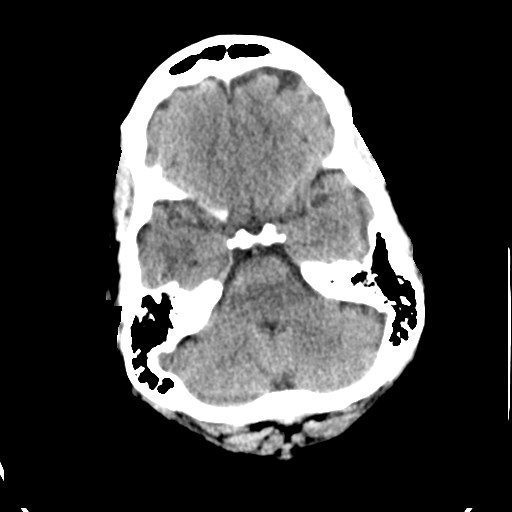
[im 12/32  brain]
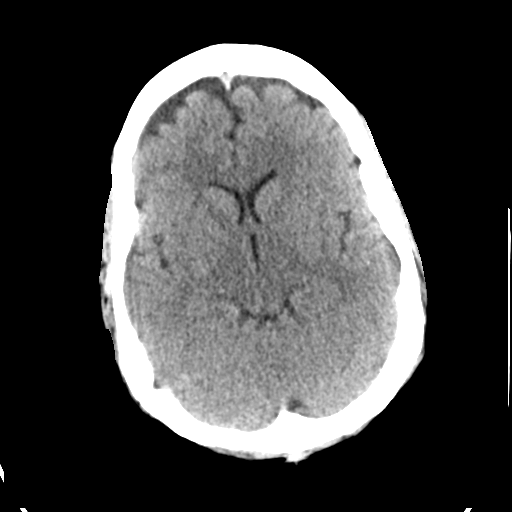
[im 16/32  brain]
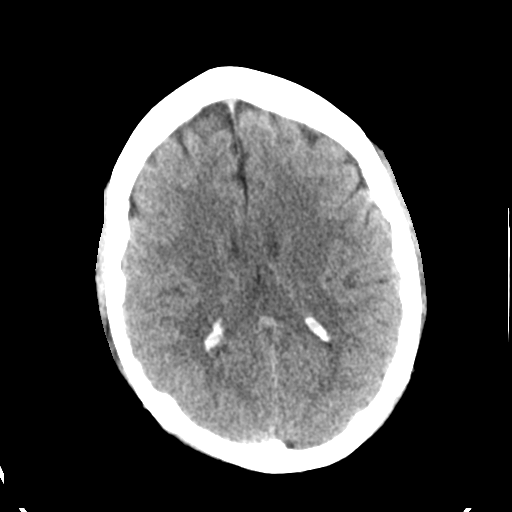
[im 20/32  brain]
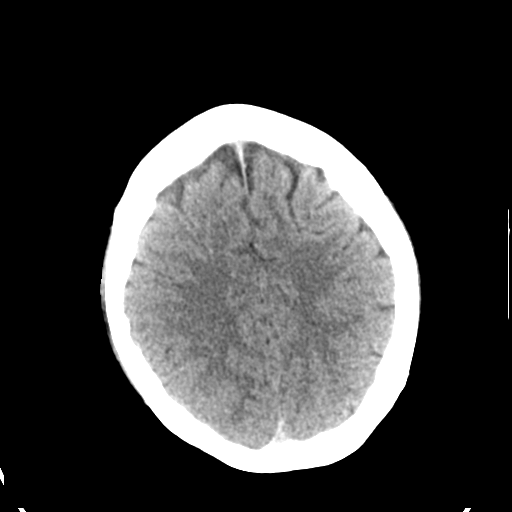
[im 20/32  bone]
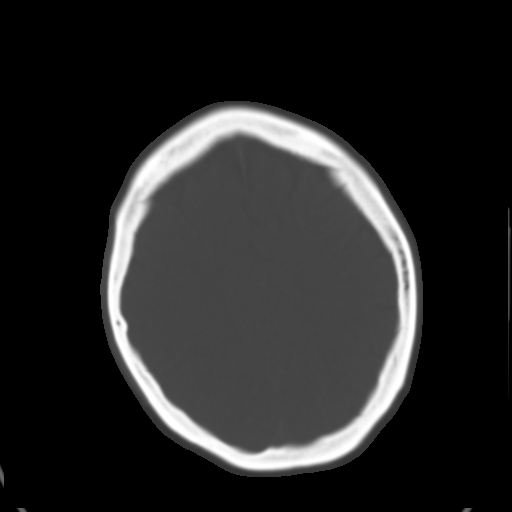
[im 24/32  brain]
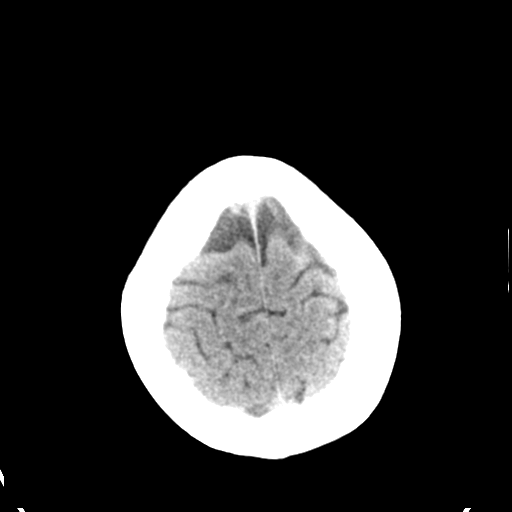
[im 28/32  brain]
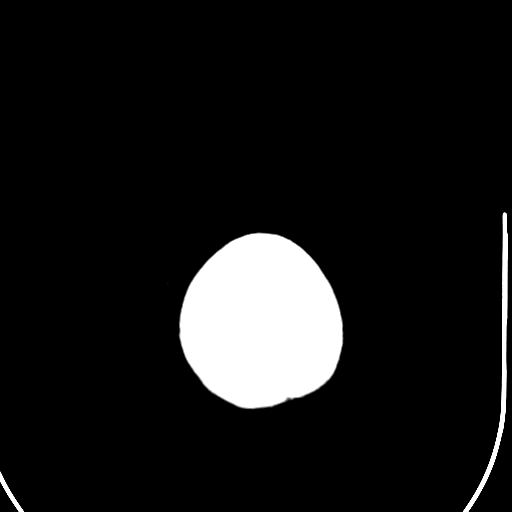

[Series 3: head bone · axial · 0.43mm/px · z∈[-36,-4]mm · 3 of 78 slices shown]
[im 8/78  bone]
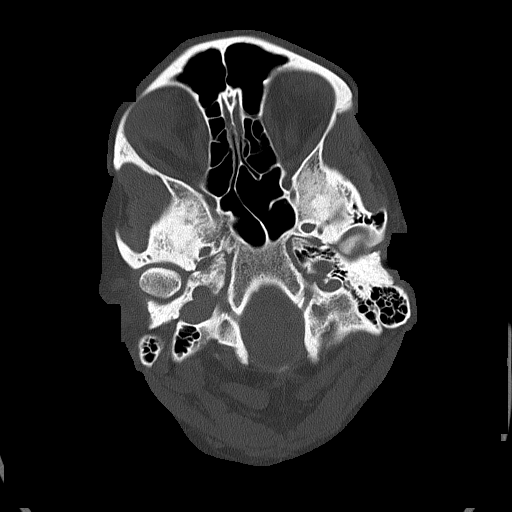
[im 16/78  bone]
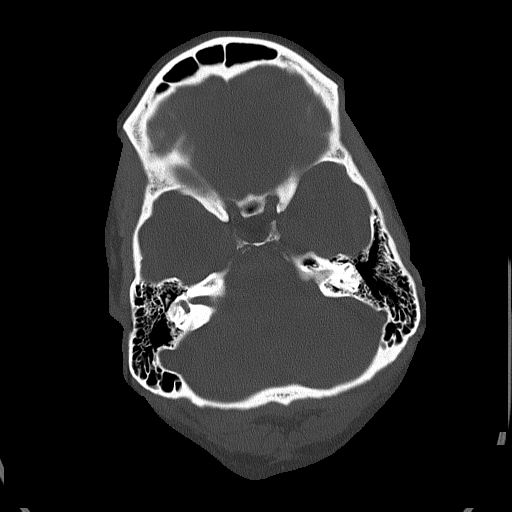
[im 24/78  bone]
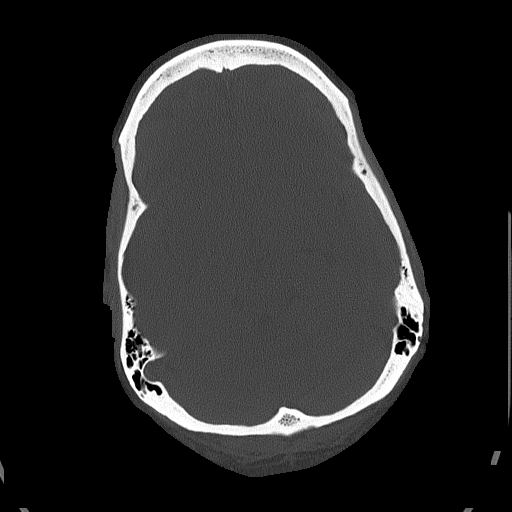

[Series 4: head without cor · coronal · non-contrast · 0.29mm/px · 3 of 70 slices shown]
[im 24/70  brain]
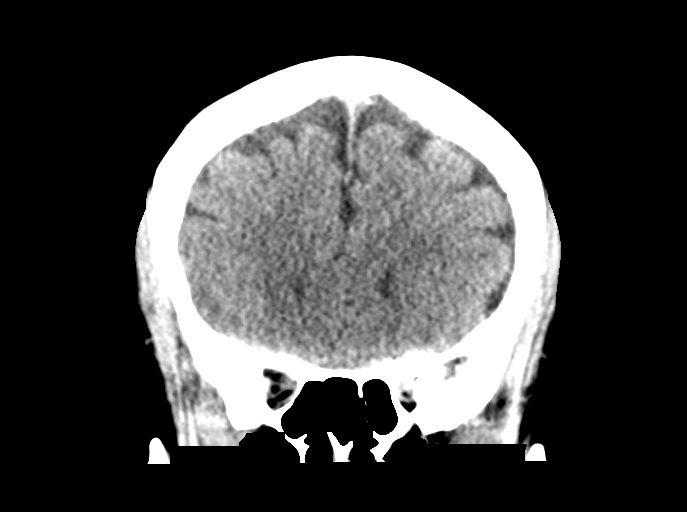
[im 31/70  brain]
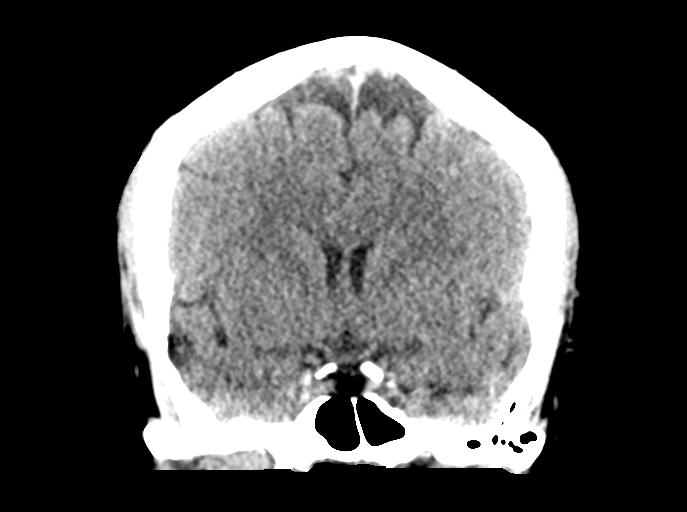
[im 39/70  brain]
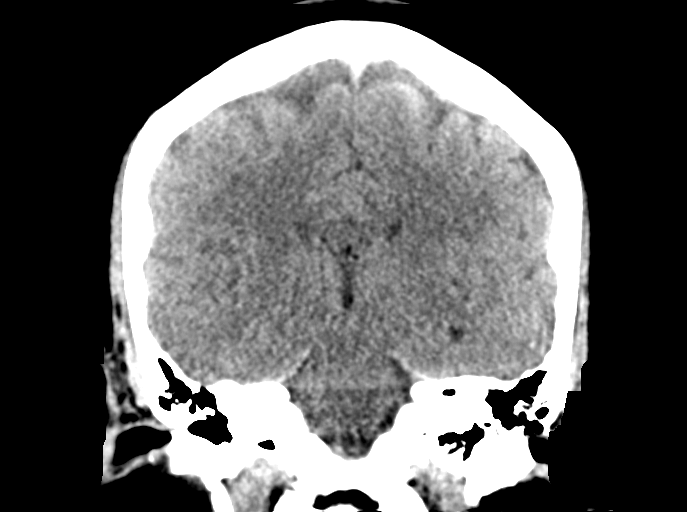

[Series 5: head without sag · sagittal · non-contrast · 0.32mm/px · 3 of 55 slices shown]
[im 19/55  brain]
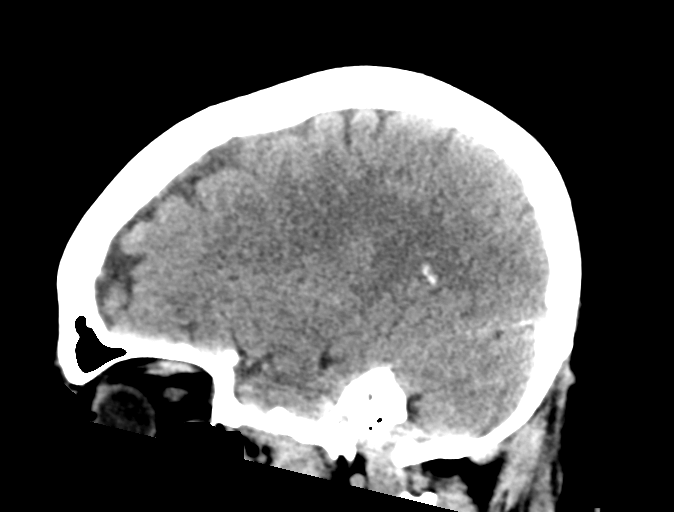
[im 28/55  brain]
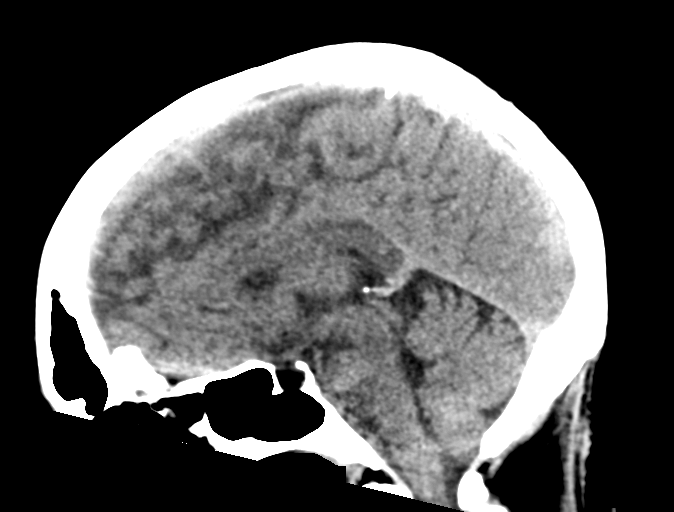
[im 37/55  brain]
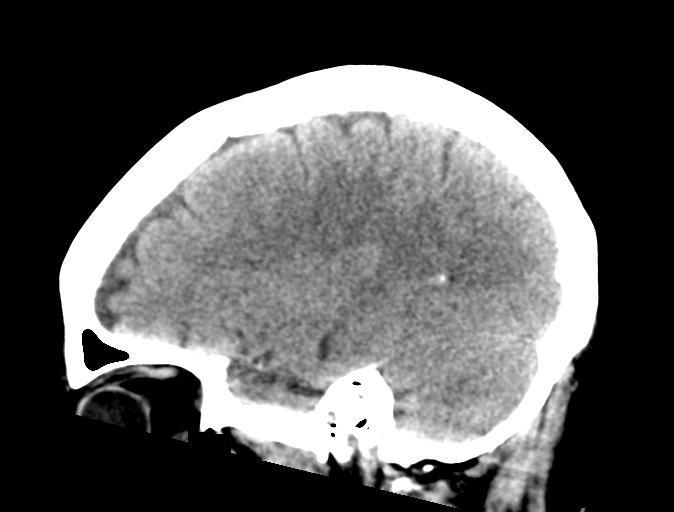

[16 of 47 positions shown; findings below may reference images not displayed]

FINDINGS: Brain: No acute intracranial abnormality. Specifically, no
hemorrhage, hydrocephalus, mass lesion, acute infarction, or
significant intracranial injury.

Vascular: No hyperdense vessel or unexpected calcification.

Skull: No acute calvarial abnormality.

Sinuses/Orbits: Visualized paranasal sinuses and mastoids clear.
Orbital soft tissues unremarkable.

Other: None
IMPRESSION: No acute intracranial abnormality.

## 2017-02-27 ENCOUNTER — Encounter: Admission: RE | Payer: Self-pay | Source: Ambulatory Visit

## 2017-02-27 ENCOUNTER — Ambulatory Visit: Admission: RE | Admit: 2017-02-27 | Payer: 59 | Source: Ambulatory Visit | Admitting: Gastroenterology

## 2017-02-27 SURGERY — EGD (ESOPHAGOGASTRODUODENOSCOPY)
Anesthesia: General

## 2017-03-03 ENCOUNTER — Ambulatory Visit
Admission: RE | Admit: 2017-03-03 | Discharge: 2017-03-03 | Disposition: A | Discharge status: Home / Self Care | Payer: 59 | Source: Ambulatory Visit | Attending: Family Medicine | Admitting: Family Medicine

## 2017-03-03 DIAGNOSIS — Z1231 Encounter for screening mammogram for malignant neoplasm of breast: Secondary | ICD-10-CM | POA: Diagnosis not present

## 2017-03-23 ENCOUNTER — Encounter: Payer: Self-pay | Admitting: Emergency Medicine

## 2017-03-23 ENCOUNTER — Emergency Department: Payer: 59

## 2017-03-23 ENCOUNTER — Emergency Department
Admission: EM | Admit: 2017-03-23 | Discharge: 2017-03-23 | Disposition: A | Discharge status: Home / Self Care | Payer: 59 | Attending: Emergency Medicine | Admitting: Emergency Medicine

## 2017-03-23 DIAGNOSIS — E119 Type 2 diabetes mellitus without complications: Secondary | ICD-10-CM | POA: Insufficient documentation

## 2017-03-23 DIAGNOSIS — R079 Chest pain, unspecified: Secondary | ICD-10-CM | POA: Diagnosis not present

## 2017-03-23 DIAGNOSIS — I10 Essential (primary) hypertension: Secondary | ICD-10-CM | POA: Diagnosis not present

## 2017-03-23 DIAGNOSIS — Z79899 Other long term (current) drug therapy: Secondary | ICD-10-CM | POA: Diagnosis not present

## 2017-03-23 DIAGNOSIS — Z7982 Long term (current) use of aspirin: Secondary | ICD-10-CM | POA: Insufficient documentation

## 2017-03-23 DIAGNOSIS — Z87891 Personal history of nicotine dependence: Secondary | ICD-10-CM | POA: Insufficient documentation

## 2017-03-23 LAB — URINALYSIS, ROUTINE W REFLEX MICROSCOPIC
Bilirubin Urine: NEGATIVE
Glucose, UA: NEGATIVE mg/dL
Hgb urine dipstick: NEGATIVE
Ketones, ur: NEGATIVE mg/dL
LEUKOCYTES UA: NEGATIVE
Nitrite: NEGATIVE
PROTEIN: NEGATIVE mg/dL
Specific Gravity, Urine: 1.011 (ref 1.005–1.030)
pH: 6 (ref 5.0–8.0)

## 2017-03-23 LAB — HEPATIC FUNCTION PANEL
ALT: 28 U/L (ref 14–54)
AST: 30 U/L (ref 15–41)
Albumin: 3.9 g/dL (ref 3.5–5.0)
Alkaline Phosphatase: 42 U/L (ref 38–126)
Bilirubin, Direct: <0.1 mg/dL — ABNORMAL LOW (ref 0.1–0.5)
TOTAL PROTEIN: 7.2 g/dL (ref 6.5–8.1)
Total Bilirubin: 0.5 mg/dL (ref 0.3–1.2)

## 2017-03-23 LAB — BASIC METABOLIC PANEL
ANION GAP: 8 (ref 5–15)
BUN: 11 mg/dL (ref 6–20)
CALCIUM: 9.4 mg/dL (ref 8.9–10.3)
CO2: 27 mmol/L (ref 22–32)
Chloride: 100 mmol/L — ABNORMAL LOW (ref 101–111)
Creatinine, Ser: 0.77 mg/dL (ref 0.44–1.00)
Glucose, Bld: 134 mg/dL — ABNORMAL HIGH (ref 65–99)
POTASSIUM: 3.4 mmol/L — AB (ref 3.5–5.1)
Sodium: 135 mmol/L (ref 135–145)

## 2017-03-23 LAB — CBC
HEMATOCRIT: 36.8 % (ref 35.0–47.0)
HEMOGLOBIN: 12.9 g/dL (ref 12.0–16.0)
MCH: 31 pg (ref 26.0–34.0)
MCHC: 35.1 g/dL (ref 32.0–36.0)
MCV: 88.3 fL (ref 80.0–100.0)
Platelets: 232 10*3/uL (ref 150–440)
RBC: 4.16 MIL/uL (ref 3.80–5.20)
RDW: 12.9 % (ref 11.5–14.5)
WBC: 7.8 10*3/uL (ref 3.6–11.0)

## 2017-03-23 LAB — TROPONIN I: Troponin I: <0.03 ng/mL (ref <0.03)

## 2017-03-23 MED ORDER — POTASSIUM CHLORIDE 20 MEQ PO PACK
40.0000 meq | PACK | ORAL | Status: AC
  Administered 2017-03-23: 40 meq via ORAL
  Filled 2017-03-23: qty 2

## 2017-03-23 MED ORDER — ASPIRIN 81 MG PO CHEW
324.0000 mg | CHEWABLE_TABLET | Freq: Once | ORAL | Status: AC
  Administered 2017-03-23: 324 mg via ORAL
  Filled 2017-03-23: qty 4

## 2017-03-23 NOTE — Discharge Instructions (Signed)

## 2017-03-23 NOTE — ED Provider Notes (Signed)
[facility] Emergency Department Provider Note  ____________________________________________   First MD Initiated Contact with Patient 03/23/17 1726     (approximate)  I have reviewed the triage vital signs and the nursing notes.   HISTORY  Chief Complaint Chest Pain    HPI Felicia Barnett is a 51 y.o. female who presents for evaluation of chest discomfort starting last night.  She reports that the discomfort started last night and she describes it as a tingling sensation in the left upper part of her chest that radiates into her left arm.  After she noticed that she started feeling episodes of palpitations and at one point checked her heart rate and it was about 130.  This continued for a period of time but then resolved.  When she woke up this morning she continued to feel some of the tingling discomfort in her upper left chest.  She does note that she has had this sensation off and on for several weeks but it was more simple starting yesterday.  She denies shortness of breath, nausea, vomiting, dizziness/lightheadedness, fever/chills, cough.  Nothing in particular makes the patient's symptoms better nor worse.  She does not describe it as a pain, but rather as discomfort.  She describes the symptoms as moderate.  Her cardiologist is Dr. Darrold Junker.  She called today to schedule the next available appointment but they recommended she come into the emergency department for evaluation first.    Past Medical History:  Diagnosis Date  . Anxiety    since surgery, pt has had attacks w/ fear of not being able to move her leg (anxiety attacks occur  3-4x month.)  . Diabetes mellitus without complication (HCC)    pre-diabetes  . Heart murmur    palpatiations  . Hyperlipidemia   . Hypertension     There are no active problems to display for this patient.   Past Surgical History:  Procedure Laterality Date  . ABDOMINAL HYSTERECTOMY    . BREAST EXCISIONAL  BIOPSY Right 1965   cyst removed as a baby  . cardial ablation     anxiety attack (heart palpitations HR=210 bpm) the day she was released from the hospital from surgery. Findings were neg .  Marland Kitchen COLONOSCOPY      Prior to Admission medications   Medication Sig Start Date End Date Taking? Authorizing Provider  aspirin EC 81 MG tablet Take 81 mg by mouth daily.    Yes [provider]  chlorthalidone (HYGROTON) 25 MG tablet Take 25 mg by mouth daily.   Yes [provider]  cholecalciferol (VITAMIN D) 1000 UNITS tablet Take 1,000 Units by mouth daily.   Yes [provider]  diphenhydrAMINE (SOMINEX) 25 MG tablet Take 25 mg by mouth at bedtime as needed for sleep.   Yes [provider]  nebivolol (BYSTOLIC) 5 MG tablet Take 2.5 mg by mouth 2 (two) times daily.    Yes [provider]  omeprazole (PRILOSEC) 20 MG capsule Take 20 mg by mouth daily as needed.    Yes [provider]  potassium chloride 20 MEQ TBCR Take 20 mEq by mouth daily. 06/08/15  Yes Rebecka Apley, MD  simvastatin (ZOCOR) 10 MG tablet Take 10 mg by mouth daily.   Yes [provider]  valACYclovir (VALTREX) 500 MG tablet Take 500 mg by mouth 2 (two) times daily as needed.    Yes [provider]    Allergies Patient has no known allergies.  Family  History  Problem Relation Age of Onset  . Breast cancer Neg Hx     Social History Social History  Substance Use Topics  . Smoking status: Former Smoker    Years: 20.00    Quit date: 03/02/2009  . Smokeless tobacco: Never Used  . Alcohol use Yes     Comment: occ    Review of Systems Constitutional: No fever/chills Eyes: No visual changes. ENT: No sore throat. Cardiovascular: Chest discomfort intermittently for several weeks, more noticeable in the left upper quadrant since yesterday, radiating a little bit into the left arm Respiratory: Denies shortness of breath. Gastrointestinal: No abdominal  pain.  No nausea, no vomiting.  No diarrhea.  No constipation. Genitourinary: Negative for dysuria. Musculoskeletal: Negative for neck pain.  Negative for back pain. Integumentary: Negative for rash. Neurological: Negative for headaches, focal weakness or numbness.   ____________________________________________   PHYSICAL EXAM:  VITAL SIGNS: ED Triage Vitals [03/23/17 1442]  Enc Vitals Group     BP (!) 155/88     Pulse Rate 90     Resp 20     Temp 98.4 F (36.9 C)     Temp Source Oral     SpO2 100 %     Weight 77.6 kg (171 lb)     Height 1.549 m (5\' 1" )     Head Circumference      Peak Flow      Pain Score 7     Pain Loc      Pain Edu?      Excl. in GC?     Constitutional: Alert and oriented. Well appearing and in no acute distress. Eyes: Conjunctivae are normal.  Head: Atraumatic. Nose: No congestion/rhinnorhea. Mouth/Throat: Mucous membranes are moist. Neck: No stridor.  No meningeal signs.   Cardiovascular: Normal rate, regular rhythm. Good peripheral circulation. Grossly normal heart sounds. Respiratory: Normal respiratory effort.  No retractions. Lungs CTAB. Gastrointestinal: Soft and nontender. No distention.  Musculoskeletal: No lower extremity tenderness nor edema. No gross deformities of extremities. Neurologic:  Normal speech and language. No gross focal neurologic deficits are appreciated.  Skin:  Skin is warm, dry and intact. No rash noted. Psychiatric: Mood and affect are Slightly anxious, otherwise normal  ____________________________________________   LABS (all labs ordered are listed, but only abnormal results are displayed)  Labs Reviewed  BASIC METABOLIC PANEL - Abnormal; Notable for the following:       Result Value   Potassium 3.4 (*)    Chloride 100 (*)    Glucose, Bld 134 (*)    All other components within normal limits  URINALYSIS, ROUTINE W REFLEX MICROSCOPIC - Abnormal; Notable for the following:    Color, Urine YELLOW (*)     APPearance CLEAR (*)    All other components within normal limits  HEPATIC FUNCTION PANEL - Abnormal; Notable for the following:    Bilirubin, Direct <0.1 (*)    All other components within normal limits  CBC  TROPONIN I  TROPONIN I   ____________________________________________  EKG  ED ECG REPORT I, Xavien Dauphinais, the attending physician, personally viewed and interpreted this ECG.  Date: 03/23/2017 EKG Time: 14:40 Rate: 95 Rhythm: normal sinus rhythm QRS Axis: normal Intervals: normal ST/T Wave abnormalities: Non-specific ST segment / T-wave changes, but no evidence of acute ischemia. Narrative Interpretation: no evidence of acute ischemia   ____________________________________________  RADIOLOGY   Dg Chest 2 View  Result Date: 03/23/2017 CLINICAL DATA:  Chest pain EXAM: CHEST  2 VIEW  COMPARISON:  January 31, 2017 FINDINGS: Lungs are clear. Heart size and pulmonary vascularity are normal. No adenopathy. No pneumothorax. No bone lesions. IMPRESSION: No edema or consolidation. Electronically Signed   By: Bretta Bang III M.D.   On: 03/23/2017 15:12    ____________________________________________   PROCEDURES  Critical Care performed: No   Procedure(s) performed:   Procedures   ____________________________________________   INITIAL IMPRESSION / ASSESSMENT AND PLAN / ED COURSE  Pertinent labs & imaging results that were available during my care of the patient were reviewed by me and considered in my medical decision making (see chart for details).  HEART score is about 3-4, and the patient has close follow up available at her cardiologist.  Checking 2nd troponin for further risk reduction, but low suspicion for ACS.  Wells Score for PE is zero.  Provided reassurance.  The patient also had a lot of concerns and questions about specifically as well as her glucose, her cholesterol levels, her potassium, etc.  I agreed that in addition to a full dose aspirin we  would replete her potassium and reassured her that her glucose of 134 is within acceptable limits in spite of her concerns that it was far too high.  She does report that she has had some discomfort when urinating recently so we are waiting urinalysis but the patient is comfortable with the plan for close outpatient follow-up with her cardiologist.      ____________________________________________  FINAL CLINICAL IMPRESSION(S) / ED DIAGNOSES  Final diagnoses:  Chest pain, unspecified type     MEDICATIONS GIVEN DURING THIS VISIT:  Medications  aspirin chewable tablet 324 mg (324 mg Oral Given 03/23/17 2012)  potassium chloride (KLOR-CON) packet 40 mEq (40 mEq Oral Given 03/23/17 2012)     NEW OUTPATIENT MEDICATIONS STARTED DURING THIS VISIT:  New Prescriptions   No medications on file    Modified Medications   No medications on file    Discontinued Medications   FAMOTIDINE (PEPCID) 20 MG TABLET    Take 20 mg by mouth 2 (two) times daily.   GABAPENTIN (NEURONTIN) 300 MG CAPSULE    Take 300 mg by mouth 3 (three) times daily.     Note:  This document was prepared using Dragon voice recognition software and may include unintentional dictation errors.    Loleta Rose, MD 03/23/17 2027

## 2017-03-23 NOTE — ED Notes (Signed)
Pt visualized in NAD. Pt resting in bed with family at bedside. This RN apologized for delay. Pt states understanding at this time. Will continue to monitor for further patient needs at this time.

## 2017-03-23 NOTE — ED Triage Notes (Signed)
Pt comes into the ED via POV c/o left chest pain that radiates into her left arm.  Patient states it started last night and has continues today.  Denies any shortness of breath, N/V, or dizziness.  Patient also states she has been having urinary frequency but denies any pain with it.  Patient in NAD at this time with even and unlabored respirations.

## 2017-03-23 NOTE — ED Notes (Signed)
This RN to bedside, apologized repeatedly to patient and family, explained was caught up in situation with another patient and was unable to come to room and give medications/collect repeat troponin. Pt states understanding. This RN explained another RN would come to bedside to collect ASAP, pt let up to go to the bathroom. Will continue to monitor for further patient needs.

## 2017-05-17 ENCOUNTER — Emergency Department: Payer: 59

## 2017-05-17 ENCOUNTER — Emergency Department
Admission: EM | Admit: 2017-05-17 | Discharge: 2017-05-17 | Disposition: A | Discharge status: Home / Self Care | Payer: 59 | Attending: Student in an Organized Health Care Education/Training Program | Admitting: Student in an Organized Health Care Education/Training Program

## 2017-05-17 ENCOUNTER — Encounter: Payer: Self-pay | Admitting: Emergency Medicine

## 2017-05-17 DIAGNOSIS — Z7982 Long term (current) use of aspirin: Secondary | ICD-10-CM | POA: Insufficient documentation

## 2017-05-17 DIAGNOSIS — Z87891 Personal history of nicotine dependence: Secondary | ICD-10-CM | POA: Diagnosis not present

## 2017-05-17 DIAGNOSIS — R002 Palpitations: Secondary | ICD-10-CM | POA: Diagnosis present

## 2017-05-17 DIAGNOSIS — Z79899 Other long term (current) drug therapy: Secondary | ICD-10-CM | POA: Insufficient documentation

## 2017-05-17 DIAGNOSIS — E119 Type 2 diabetes mellitus without complications: Secondary | ICD-10-CM | POA: Diagnosis not present

## 2017-05-17 DIAGNOSIS — I10 Essential (primary) hypertension: Secondary | ICD-10-CM | POA: Diagnosis not present

## 2017-05-17 DIAGNOSIS — R079 Chest pain, unspecified: Secondary | ICD-10-CM | POA: Diagnosis not present

## 2017-05-17 DIAGNOSIS — E876 Hypokalemia: Secondary | ICD-10-CM | POA: Insufficient documentation

## 2017-05-17 LAB — BASIC METABOLIC PANEL
Anion gap: 9 (ref 5–15)
BUN: 10 mg/dL (ref 6–20)
CHLORIDE: 100 mmol/L — AB (ref 101–111)
CO2: 29 mmol/L (ref 22–32)
Calcium: 9.8 mg/dL (ref 8.9–10.3)
Creatinine, Ser: 0.75 mg/dL (ref 0.44–1.00)
GFR calc non Af Amer: >60 mL/min (ref >60)
Glucose, Bld: 117 mg/dL — ABNORMAL HIGH (ref 65–99)
POTASSIUM: 3.4 mmol/L — AB (ref 3.5–5.1)
SODIUM: 138 mmol/L (ref 135–145)

## 2017-05-17 LAB — CBC
HEMATOCRIT: 36.3 % (ref 35.0–47.0)
Hemoglobin: 12.8 g/dL (ref 12.0–16.0)
MCH: 31 pg (ref 26.0–34.0)
MCHC: 35.2 g/dL (ref 32.0–36.0)
MCV: 88.3 fL (ref 80.0–100.0)
PLATELETS: 246 10*3/uL (ref 150–440)
RBC: 4.12 MIL/uL (ref 3.80–5.20)
RDW: 13.1 % (ref 11.5–14.5)
WBC: 7.7 10*3/uL (ref 3.6–11.0)

## 2017-05-17 LAB — TROPONIN I: Troponin I: <0.03 ng/mL (ref <0.03)

## 2017-05-17 MED ORDER — ONDANSETRON HCL 4 MG PO TABS: 4.0000 mg | ORAL_TABLET | Freq: Every day | ORAL | 0 refills | Status: AC | PRN

## 2017-05-17 NOTE — ED Provider Notes (Signed)
[facility] Emergency Department Provider Note    First MD Initiated Contact with Patient 05/17/17 2256     (approximate)  I have reviewed the triage vital signs and the nursing notes.   HISTORY  Chief Complaint Palpitations    HPI Felicia Barnett is a 51 y.o. female chief complaint of midsternal chest discomfort and tingling in her left arm that started this morning when she noted that her blood pressure was elevated. Patient has been compliant with her blood pressure medications. States that she's had these symptoms multiple times before. States the pain was constant. No associated nausea or vomiting. No diaphoresis. There is no exertional component. No positional component. Denies any fevers. States she has noted intermittent lower y edema. None today. Patient had a recent stress test which was nonischemic and reassuring.   Past Medical History:  Diagnosis Date  . Anxiety    since surgery, pt has had attacks w/ fear of not being able to move her leg (anxiety attacks occur  3-4x month.)  . Diabetes mellitus without complication (HCC)    pre-diabetes  . Heart murmur    palpatiations  . Hyperlipidemia   . Hypertension    Family History  Problem Relation Age of Onset  . Breast cancer Neg Hx    Past Surgical History:  Procedure Laterality Date  . ABDOMINAL HYSTERECTOMY    . BREAST EXCISIONAL BIOPSY Right 1965   cyst removed as a baby  . cardial ablation     anxiety attack (heart palpitations HR=210 bpm) the day she was released from the hospital from surgery. Findings were neg .  Marland Kitchen. COLONOSCOPY     There are no active problems to display for this patient.     Prior to Admission medications   Medication Sig Start Date End Date Taking? Authorizing Provider  aspirin EC 81 MG tablet Take 81 mg by mouth daily.     [provider]  chlorthalidone (HYGROTON) 25 MG tablet Take 25 mg by mouth daily.    [provider]    cholecalciferol (VITAMIN D) 1000 UNITS tablet Take 1,000 Units by mouth daily.    [provider]  diphenhydrAMINE (SOMINEX) 25 MG tablet Take 25 mg by mouth at bedtime as needed for sleep.    [provider]  nebivolol (BYSTOLIC) 5 MG tablet Take 2.5 mg by mouth 2 (two) times daily.     [provider]  omeprazole (PRILOSEC) 20 MG capsule Take 20 mg by mouth daily as needed.     [provider]  potassium chloride 20 MEQ TBCR Take 20 mEq by mouth daily. 06/08/15   Rebecka ApleyWebster, Allison P, MD  simvastatin (ZOCOR) 10 MG tablet Take 10 mg by mouth daily.    [provider]  valACYclovir (VALTREX) 500 MG tablet Take 500 mg by mouth 2 (two) times daily as needed.     [provider]    Allergies Patient has no known allergies.    Social History Social History  Substance Use Topics  . Smoking status: Former Smoker    Years: 20.00    Quit date: 03/02/2009  . Smokeless tobacco: Never Used  . Alcohol use Yes     Comment: occ    Review of Systems Patient denies headaches, rhinorrhea, blurry vision, numbness, shortness of breath, chest pain, edema, cough, abdominal pain, nausea, vomiting, diarrhea, dysuria, fevers, rashes or hallucinations unless otherwise stated above in HPI. ____________________________________________   PHYSICAL EXAM:  VITAL SIGNS: Vitals:  05/17/17 2226 05/17/17 2230  BP: 126/66 117/67  Pulse: 67 63  Resp: 15 17  Temp:      Constitutional: Alert and oriented. Well appearing and in no acute distress. Eyes: Conjunctivae are normal.  Head: Atraumatic. Nose: No congestion/rhinnorhea. Mouth/Throat: Mucous membranes are moist.   Neck: No stridor. Painless ROM.  Cardiovascular: Normal rate, regular rhythm. Grossly normal heart sounds.  Good peripheral circulation. Respiratory: Normal respiratory effort.  No retractions. Lungs CTAB. Gastrointestinal: Soft and nontender. No distention. No abdominal bruits. No CVA  tenderness. Genitourinary:  Musculoskeletal: No lower extremity tenderness nor edema.  No joint effusions. Neurologic:  Normal speech and language. No gross focal neurologic deficits are appreciated. No facial droop Skin:  Skin is warm, dry and intact. No rash noted. Psychiatric: Mood and affect are normal. Speech and behavior are normal.  ____________________________________________   LABS (all labs ordered are listed, but only abnormal results are displayed)  Results for orders placed or performed during the hospital encounter of 05/17/17 (from the past 24 hour(s))  Basic metabolic panel     Status: Abnormal   Collection Time: 05/17/17  8:15 PM  Result Value Ref Range   Sodium 138 135 - 145 mmol/L   Potassium 3.4 (L) 3.5 - 5.1 mmol/L   Chloride 100 (L) 101 - 111 mmol/L   CO2 29 22 - 32 mmol/L   Glucose, Bld 117 (H) 65 - 99 mg/dL   BUN 10 6 - 20 mg/dL   Creatinine, Ser 1.61 0.44 - 1.00 mg/dL   Calcium 9.8 8.9 - 09.6 mg/dL   GFR calc non Af Amer >60 >60 mL/min   GFR calc Af Amer >60 >60 mL/min   Anion gap 9 5 - 15  CBC     Status: None   Collection Time: 05/17/17  8:15 PM  Result Value Ref Range   WBC 7.7 3.6 - 11.0 K/uL   RBC 4.12 3.80 - 5.20 MIL/uL   Hemoglobin 12.8 12.0 - 16.0 g/dL   HCT 04.5 40.9 - 81.1 %   MCV 88.3 80.0 - 100.0 fL   MCH 31.0 26.0 - 34.0 pg   MCHC 35.2 32.0 - 36.0 g/dL   RDW 91.4 78.2 - 95.6 %   Platelets 246 150 - 440 K/uL  Troponin I     Status: None   Collection Time: 05/17/17  8:15 PM  Result Value Ref Range   Troponin I <0.03 <0.03 ng/mL   ____________________________________________  EKG My review and personal interpretation at Time: 20:17   Indication: palpitations  Rate: 70  Rhythm: sinus Axis: normal Other: normal intervals, no stemi, no depressions ____________________________________________  RADIOLOGY  I personally reviewed all radiographic images ordered to evaluate for the above acute complaints and reviewed radiology reports  and findings.  These findings were personally discussed with the patient.  Please see medical record for radiology report.  ____________________________________________   PROCEDURES  Procedure(s) performed:  Procedures    Critical Care performed: no ____________________________________________   INITIAL IMPRESSION / ASSESSMENT AND PLAN / ED COURSE  Pertinent labs & imaging results that were available during my care of the patient were reviewed by me and considered in my medical decision making (see chart for details).  DDX: ACS, pericarditis, esophagitis, boerhaaves, pe, dissection, pna, bronchitis, costochondritis   MATISHA TERMINE is a 51 y.o. who presents to the ED with above complaints. Patient afebrile hemodynamically stable. He is not clinically consistent with ACS. Patient with a heart score of 2 with a normal  EKG and negative troponin pound in after over 12 hours of symptoms. Chest x-ray is reassuring. Her abdominal exam is soft and benign. She has no evidence of congestive heart failure. No focal neuro deficits. This is not clinically consistent with dissection.Blood pressure has improved. Does have mild hypokalemia for which she takes home potassium. At this point is for the patient is stable for follow-up with her PCP for further discussion of her blood pressure management.      ____________________________________________   FINAL CLINICAL IMPRESSION(S) / ED DIAGNOSES  Final diagnoses:  Chest pain, unspecified type  Hypokalemia      NEW MEDICATIONS STARTED DURING THIS VISIT:  New Prescriptions   No medications on file     Note:  This document was prepared using Dragon voice recognition software and may include unintentional dictation errors.    Willy Eddy, MD 05/17/17 2312

## 2017-05-17 NOTE — ED Notes (Signed)
Pt states hx palpitations and SVT. Pt states saw cardiologist yesterday, informed that stress test was good. Pt states today at work she felt her heart racing, BP was increased, had central CP, L facial numbness. Pt denies symptoms at current except L arm pain. Alert, oriented, ambulatory.

## 2017-05-17 NOTE — ED Triage Notes (Signed)
Pt ambulatory to triage with steady gait, no distress noted. Pt c/o palpitations, left arm tingling/pan that started approximately at 1730 and has been constant since. Pt denies SOB/N/V but sts she has "some" chest tightness. Pt has HX of similar symptoms, has had recent stress testing and cardiologist workup.

## 2017-08-19 DIAGNOSIS — Z7982 Long term (current) use of aspirin: Secondary | ICD-10-CM | POA: Diagnosis not present

## 2017-08-19 DIAGNOSIS — R079 Chest pain, unspecified: Secondary | ICD-10-CM | POA: Diagnosis not present

## 2017-08-19 DIAGNOSIS — R002 Palpitations: Secondary | ICD-10-CM | POA: Insufficient documentation

## 2017-08-19 DIAGNOSIS — Z79899 Other long term (current) drug therapy: Secondary | ICD-10-CM | POA: Insufficient documentation

## 2017-08-19 DIAGNOSIS — Z87891 Personal history of nicotine dependence: Secondary | ICD-10-CM | POA: Diagnosis not present

## 2017-08-19 DIAGNOSIS — I10 Essential (primary) hypertension: Secondary | ICD-10-CM | POA: Insufficient documentation

## 2017-08-19 DIAGNOSIS — E119 Type 2 diabetes mellitus without complications: Secondary | ICD-10-CM | POA: Diagnosis not present

## 2017-08-20 ENCOUNTER — Encounter: Payer: Self-pay | Admitting: Emergency Medicine

## 2017-08-20 ENCOUNTER — Other Ambulatory Visit: Payer: Self-pay

## 2017-08-20 ENCOUNTER — Emergency Department
Admission: EM | Admit: 2017-08-20 | Discharge: 2017-08-20 | Disposition: A | Discharge status: Home / Self Care | Payer: 59 | Attending: Emergency Medicine | Admitting: Emergency Medicine

## 2017-08-20 ENCOUNTER — Emergency Department: Payer: 59

## 2017-08-20 DIAGNOSIS — R079 Chest pain, unspecified: Secondary | ICD-10-CM

## 2017-08-20 DIAGNOSIS — R002 Palpitations: Secondary | ICD-10-CM

## 2017-08-20 LAB — BASIC METABOLIC PANEL
Anion gap: 14 (ref 5–15)
BUN: 16 mg/dL (ref 6–20)
CO2: 25 mmol/L (ref 22–32)
Calcium: 9.5 mg/dL (ref 8.9–10.3)
Chloride: 98 mmol/L — ABNORMAL LOW (ref 101–111)
Creatinine, Ser: 0.72 mg/dL (ref 0.44–1.00)
GFR calc Af Amer: >60 mL/min (ref >60)
GFR calc non Af Amer: >60 mL/min (ref >60)
GLUCOSE: 137 mg/dL — AB (ref 65–99)
Potassium: 3.2 mmol/L — ABNORMAL LOW (ref 3.5–5.1)
Sodium: 137 mmol/L (ref 135–145)

## 2017-08-20 LAB — CBC
HEMATOCRIT: 38 % (ref 35.0–47.0)
HEMOGLOBIN: 13 g/dL (ref 12.0–16.0)
MCH: 30.5 pg (ref 26.0–34.0)
MCHC: 34.1 g/dL (ref 32.0–36.0)
MCV: 89.5 fL (ref 80.0–100.0)
Platelets: 269 10*3/uL (ref 150–440)
RBC: 4.25 MIL/uL (ref 3.80–5.20)
RDW: 13.1 % (ref 11.5–14.5)
WBC: 9.1 10*3/uL (ref 3.6–11.0)

## 2017-08-20 LAB — MAGNESIUM: Magnesium: 1.6 mg/dL — ABNORMAL LOW (ref 1.7–2.4)

## 2017-08-20 LAB — TROPONIN I
Troponin I: <0.03 ng/mL (ref <0.03)
Troponin I: <0.03 ng/mL (ref <0.03)

## 2017-08-20 LAB — TSH: TSH: 2.207 u[IU]/mL (ref 0.350–4.500)

## 2017-08-20 MED ORDER — MAGNESIUM OXIDE 400 (241.3 MG) MG PO TABS
400.0000 mg | ORAL_TABLET | Freq: Once | ORAL | Status: AC
  Administered 2017-08-20: 400 mg via ORAL
  Filled 2017-08-20: qty 1

## 2017-08-20 MED ORDER — MAGNESIUM SULFATE 2 GM/50ML IV SOLN
2.0000 g | Freq: Once | INTRAVENOUS | Status: DC
  Filled 2017-08-20: qty 50

## 2017-08-20 NOTE — ED Provider Notes (Signed)
[facility] Emergency Department Provider Note   ____________________________________________   First MD Initiated Contact with Patient 08/20/17 [id number]     (approximate)  I have reviewed the triage vital signs and the nursing notes.   HISTORY  Chief Complaint Chest Pain    HPI Lesia HausenValentina I Montanaro is a 51 y.o. female who comes into the hospital today feeling sick. She reports that she started getting some pain on the right side of her chest. Her blood pressure was up to 180/118. She started having some palpitations in her mouth was dry. The patient then developed some pain in her left chest and states that she had discomfort in her jaw. This all started around 11 PM. She reports that the pain is gone at this time but she was concerned. She states that she took her blood pressure medicine but didn't take any other medications. She drinks some celery juice which she reports is good for blood pressure. The patient did have some shortness of breath on her way here but denies any dizziness, nausea, vomiting, sweats. She did have some chills. The patient was concerned about this pain so she came in for further evaluation.   Past Medical History:  Diagnosis Date  . Anxiety    since surgery, pt has had attacks w/ fear of not being able to move her leg (anxiety attacks occur  3-4x month.)  . Diabetes mellitus without complication (HCC)    pre-diabetes  . Heart murmur    palpatiations  . Hyperlipidemia   . Hypertension     There are no active problems to display for this patient.   Past Surgical History:  Procedure Laterality Date  . ABDOMINAL HYSTERECTOMY    . BREAST EXCISIONAL BIOPSY Right 1965   cyst removed as a baby  . cardial ablation     anxiety attack (heart palpitations HR=210 bpm) the day she was released from the hospital from surgery. Findings were neg .  Marland Kitchen. COLONOSCOPY      Prior to Admission medications   Medication Sig Start Date End Date  Taking? Authorizing Provider  aspirin EC 81 MG tablet Take 81 mg by mouth daily.     [provider]  chlorthalidone (HYGROTON) 25 MG tablet Take 25 mg by mouth daily.    [provider]  cholecalciferol (VITAMIN D) 1000 UNITS tablet Take 1,000 Units by mouth daily.    [provider]  diphenhydrAMINE (SOMINEX) 25 MG tablet Take 25 mg by mouth at bedtime as needed for sleep.    [provider]  nebivolol (BYSTOLIC) 5 MG tablet Take 2.5 mg by mouth 2 (two) times daily.     [provider]  omeprazole (PRILOSEC) 20 MG capsule Take 20 mg by mouth daily as needed.     [provider]  ondansetron (ZOFRAN) 4 MG tablet Take 1 tablet (4 mg total) by mouth daily as needed for nausea or vomiting. 05/17/17 05/17/18  Willy Eddyobinson, Patrick, MD  potassium chloride 20 MEQ TBCR Take 20 mEq by mouth daily. 06/08/15   Rebecka ApleyWebster, Dreydon Cardenas P, MD  simvastatin (ZOCOR) 10 MG tablet Take 10 mg by mouth daily.    [provider]  valACYclovir (VALTREX) 500 MG tablet Take 500 mg by mouth 2 (two) times daily as needed.     [provider]    Allergies Patient has no known allergies.  Family History  Problem Relation Age of Onset  . Breast cancer Neg Hx     Social  History Social History   Tobacco Use  . Smoking status: Former Smoker    Years: 20.00    Last attempt to quit: 03/02/2009    Years since quitting: 8.4  . Smokeless tobacco: Never Used  Substance Use Topics  . Alcohol use: Yes    Comment: occ  . Drug use: No    Review of Systems  Constitutional: No fever/chills Eyes: No visual changes. ENT: No sore throat. Cardiovascular:  chest pain. Respiratory:  shortness of breath. Gastrointestinal: No abdominal pain.  No nausea, no vomiting.  No diarrhea.  No constipation. Genitourinary: Negative for dysuria. Musculoskeletal: Negative for back pain. Skin: Negative for rash. Neurological:  dizziness   ____________________________________________   PHYSICAL EXAM:  VITAL SIGNS: ED Triage Vitals  Enc Vitals Group     BP 08/20/17 0003 (!) 160/100     Pulse Rate 08/20/17 0003 (!) 108     Resp 08/20/17 0003 18     Temp 08/20/17 0003 98.1 F (36.7 C)     Temp Source 08/20/17 0003 Oral     SpO2 08/20/17 0003 100 %     Weight 08/20/17 0000 167 lb (75.8 kg)     Height 08/20/17 0000 5' (1.524 m)     Head Circumference --      Peak Flow --      Pain Score 08/20/17 0000 6     Pain Loc --      Pain Edu? --      Excl. in GC? --     Constitutional: Alert and oriented. Well appearing and in no acute distress. Eyes: Conjunctivae are normal. PERRL. EOMI. Head: Atraumatic. Nose: No congestion/rhinnorhea. Mouth/Throat: Mucous membranes are moist.  Oropharynx non-erythematous. Cardiovascular: Normal rate, regular rhythm. Grossly normal heart sounds.  Good peripheral circulation. Respiratory: Normal respiratory effort.  No retractions. Lungs CTAB. Gastrointestinal: Soft and nontender. No distention. positive bowel sounds Musculoskeletal: No lower extremity tenderness nor edema.   Neurologic:  Normal speech and language. Skin:  Skin is warm, dry and intact.  Psychiatric: Mood and affect are normal.   ____________________________________________   LABS (all labs ordered are listed, but only abnormal results are displayed)  Labs Reviewed  BASIC METABOLIC PANEL - Abnormal; Notable for the following components:      Result Value   Potassium 3.2 (*)    Chloride 98 (*)    Glucose, Bld 137 (*)    All other components within normal limits  MAGNESIUM - Abnormal; Notable for the following components:   Magnesium 1.6 (*)    All other components within normal limits  CBC  TROPONIN I  TROPONIN I  TSH   ____________________________________________  EKG  ED ECG REPORT I, Rebecka ApleyWebster,  Daisuke Bailey P, the attending physician, personally viewed and interpreted this ECG.   Date:  08/20/2017  EKG Time: 0002  Rate: 101  Rhythm: sinus tachycardia  Axis: normal  Intervals:none  ST&T Change: flipped T waves in lead V5 and 6  ____________________________________________  RADIOLOGY  Dg Chest 2 View  Result Date: 08/20/2017 CLINICAL DATA:  Chest pain EXAM: CHEST  2 VIEW COMPARISON:  05/17/2017 FINDINGS: The heart size and mediastinal contours are within normal limits. Both lungs are clear. The visualized skeletal structures are unremarkable. IMPRESSION: No active cardiopulmonary disease. Electronically Signed   By: Jasmine PangKim  Fujinaga M.D.   On: 08/20/2017 00:21    ____________________________________________   PROCEDURES  Procedure(s) performed: None  Procedures  Critical Care performed: No  ____________________________________________   INITIAL IMPRESSION / ASSESSMENT AND  PLAN / ED COURSE  As part of my medical decision making, I reviewed the following data within the electronic MEDICAL RECORD NUMBER Notes from prior ED visits and [place] Controlled Substance Database   This is a 51 year old female who comes into the hospital today with some left-sided chest pain with some pain into her jaw. The patient had some blood work done and initially was unremarkable. Given her palpitations I did add on a magnesium and a TSH. The patient had a repeat troponin which was also negative. The patient's magnesium came back at 1.6. The patient's differential diagnosis includes acute coronary syndrome, palpitations, nonspecific chest pain.  It appears that the patient's troponins are negative. I will give her a dose of magnesium sulfate in the emergency department. As the patient's pain is improved and her workup is unremarkable she'll be discharged home to follow-up with her primary care physician.      ____________________________________________   FINAL CLINICAL IMPRESSION(S) / ED DIAGNOSES  Final diagnoses:  Chest pain, unspecified type  Palpitations     ED Discharge  Orders    None       Note:  This document was prepared using Dragon voice recognition software and may include unintentional dictation errors.    Rebecka Apley, MD 08/20/17 [phone]

## 2017-08-20 NOTE — ED Triage Notes (Signed)
Pt to triage via WC, report central chest pain starting an hour ago, difficulty describing, reports nausea and palpitations as well.  Pt w/ hx of palpitations and ablation 3 years ago.  Pt appears anxious in triage.

## 2017-08-20 NOTE — ED Notes (Signed)
Pt states history of hypertension and palpitations. Pt states she was at a party tonight and began to feel left sided chest discomfort and bilateral jaw discomfort. Pt states she began to drive self to ed when she felt palpitations. Pt states at home prior to arrival her blood pressure was 183/118 and her heart rate was over 100. Pt denies shortness of breath, radiation of discomfort to back or arms. Pt states she did have 3 shots of tequila this pm and does not consume ETOH frequently. Pt denies recent stressors. Pt appears in no acute distress currently and reports feeling improved.

## 2017-08-20 NOTE — Discharge Instructions (Signed)
Please follow up with cardiology for further evaluation of your chest pain and palpitations

## 2017-08-20 NOTE — ED Notes (Signed)
Patient transported to X-ray 

## 2017-08-20 NOTE — ED Notes (Signed)
Patient sitting in wheelchair in no distress at this time.

## 2017-08-21 ENCOUNTER — Telehealth: Payer: Self-pay

## 2017-08-21 NOTE — Telephone Encounter (Signed)
No answer no voicemail Pt was seen in ED on 08/19/17  They will be a new patient  Will try again at a later time

## 2017-08-25 NOTE — Telephone Encounter (Signed)
Pt has another provider/will not need our services

## 2017-09-10 ENCOUNTER — Emergency Department
Admission: EM | Admit: 2017-09-10 | Discharge: 2017-09-11 | Disposition: A | Discharge status: Home / Self Care | Payer: 59 | Attending: Emergency Medicine | Admitting: Emergency Medicine

## 2017-09-10 ENCOUNTER — Emergency Department: Payer: 59

## 2017-09-10 ENCOUNTER — Encounter: Payer: Self-pay | Admitting: Emergency Medicine

## 2017-09-10 DIAGNOSIS — Z79899 Other long term (current) drug therapy: Secondary | ICD-10-CM | POA: Insufficient documentation

## 2017-09-10 DIAGNOSIS — E119 Type 2 diabetes mellitus without complications: Secondary | ICD-10-CM | POA: Diagnosis not present

## 2017-09-10 DIAGNOSIS — R0789 Other chest pain: Secondary | ICD-10-CM | POA: Diagnosis not present

## 2017-09-10 DIAGNOSIS — Z7982 Long term (current) use of aspirin: Secondary | ICD-10-CM | POA: Diagnosis not present

## 2017-09-10 DIAGNOSIS — I10 Essential (primary) hypertension: Secondary | ICD-10-CM | POA: Insufficient documentation

## 2017-09-10 DIAGNOSIS — Z87891 Personal history of nicotine dependence: Secondary | ICD-10-CM | POA: Insufficient documentation

## 2017-09-10 DIAGNOSIS — M549 Dorsalgia, unspecified: Secondary | ICD-10-CM | POA: Diagnosis not present

## 2017-09-10 DIAGNOSIS — R079 Chest pain, unspecified: Secondary | ICD-10-CM

## 2017-09-10 LAB — CBC
HCT: 37 % (ref 35.0–47.0)
Hemoglobin: 12.9 g/dL (ref 12.0–16.0)
MCH: 30.9 pg (ref 26.0–34.0)
MCHC: 35 g/dL (ref 32.0–36.0)
MCV: 88.1 fL (ref 80.0–100.0)
PLATELETS: 266 10*3/uL (ref 150–440)
RBC: 4.2 MIL/uL (ref 3.80–5.20)
RDW: 12.9 % (ref 11.5–14.5)
WBC: 8.4 10*3/uL (ref 3.6–11.0)

## 2017-09-10 LAB — BASIC METABOLIC PANEL
Anion gap: 11 (ref 5–15)
BUN: 13 mg/dL (ref 6–20)
CALCIUM: 9.2 mg/dL (ref 8.9–10.3)
CHLORIDE: 100 mmol/L — AB (ref 101–111)
CO2: 27 mmol/L (ref 22–32)
CREATININE: 0.7 mg/dL (ref 0.44–1.00)
Glucose, Bld: 138 mg/dL — ABNORMAL HIGH (ref 65–99)
Potassium: 3.2 mmol/L — ABNORMAL LOW (ref 3.5–5.1)
SODIUM: 138 mmol/L (ref 135–145)

## 2017-09-10 LAB — TROPONIN I

## 2017-09-10 MED ORDER — FAMOTIDINE 20 MG PO TABS
20.0000 mg | ORAL_TABLET | Freq: Once | ORAL | Status: AC
  Administered 2017-09-10: 20 mg via ORAL
  Filled 2017-09-10: qty 1

## 2017-09-10 NOTE — ED Triage Notes (Signed)
Patient with complaint of left side chest pain radiating to her back. Patient reports that the pain started this morning but became worse after eating tonight.

## 2017-09-10 NOTE — ED Provider Notes (Signed)
[facility] Emergency Department Provider Note ____________________________________________   First MD Initiated Contact with Patient 09/10/17 2234     (approximate)  I have reviewed the triage vital signs and the nursing notes.   HISTORY  Chief Complaint Chest Pain    HPI TATISHA CERINO is a 51 y.o. female with past medical history of anxiety, panic attacks,palpitations, diabetes, and GERD who presents with chest discomfort, acute onset approximately 3 hours ago, occurring after she was doing some cleaning work in her house, and described as a vague soreness or discomfort, similar to pain after exercising.  She states it radiates somewhat to her back.  She denies associated nausea or vomiting, shortness of breath, or lightheadedness.  She does state that earlier in the day after eating she did have some epigastric and chest discomfort that she felt was similar to acid reflux.  She did not take any medicine for the pain  Patient states that the pain is both similar and different than the symptoms she has had on prior ED visits for chest discomfort, but is not able to clearly express exactly what way it is different.  No associated leg pain or swelling.  No cough or fever.   Past Medical History:  Diagnosis Date  . Anxiety    since surgery, pt has had attacks w/ fear of not being able to move her leg (anxiety attacks occur  3-4x month.)  . Diabetes mellitus without complication (HCC)    pre-diabetes  . Heart murmur    palpatiations  . Hyperlipidemia   . Hypertension     There are no active problems to display for this patient.   Past Surgical History:  Procedure Laterality Date  . ABDOMINAL HYSTERECTOMY    . cardial ablation     anxiety attack (heart palpitations HR=210 bpm) the day she was released from the hospital from surgery. Findings were neg .  Marland Kitchen COLONOSCOPY      Prior to Admission medications   Medication Sig Start Date End Date  Taking? Authorizing Provider  aspirin EC 81 MG tablet Take 81 mg by mouth daily.     [provider]  chlorthalidone (HYGROTON) 25 MG tablet Take 25 mg by mouth daily.    [provider]  cholecalciferol (VITAMIN D) 1000 UNITS tablet Take 1,000 Units by mouth daily.    [provider]  diphenhydrAMINE (SOMINEX) 25 MG tablet Take 25 mg by mouth at bedtime as needed for sleep.    [provider]  nebivolol (BYSTOLIC) 5 MG tablet Take 2.5 mg by mouth 2 (two) times daily.     [provider]  omeprazole (PRILOSEC) 20 MG capsule Take 20 mg by mouth daily as needed.     [provider]  ondansetron (ZOFRAN) 4 MG tablet Take 1 tablet (4 mg total) by mouth daily as needed for nausea or vomiting. 05/17/17 05/17/18  Willy Eddy, MD  potassium chloride 20 MEQ TBCR Take 20 mEq by mouth daily. 06/08/15   Rebecka Apley, MD  simvastatin (ZOCOR) 10 MG tablet Take 10 mg by mouth daily.    [provider]  valACYclovir (VALTREX) 500 MG tablet Take 500 mg by mouth 2 (two) times daily as needed.     [provider]    Allergies Patient has no known allergies.  Family History  Problem Relation Age of Onset  . Breast cancer Neg Hx     Social History Social History   Tobacco Use  .  Smoking status: Former Smoker    Years: 20.00    Last attempt to quit: 03/02/2009    Years since quitting: 8.5  . Smokeless tobacco: Never Used  Substance Use Topics  . Alcohol use: Yes    Comment: occ  . Drug use: No    Review of Systems  Constitutional: No fever/chills.  Eyes: No visual changes. ENT: No sore throat. Cardiovascular: Positive for chest pain. Respiratory: Denies shortness of breath. Gastrointestinal: No nausea, no vomiting.  No diarrhea.  Genitourinary: Negative for dysuria.  Musculoskeletal: Positive for for back pain. Skin: Negative for rash. Neurological: Negative for headaches, focal weakness or  numbness.   ____________________________________________   PHYSICAL EXAM:  VITAL SIGNS: ED Triage Vitals  Enc Vitals Group     BP 09/10/17 2048 (!) 144/86     Pulse Rate 09/10/17 2048 73     Resp 09/10/17 2048 16     Temp 09/10/17 2048 98.2 F (36.8 C)     Temp Source 09/10/17 2048 Oral     SpO2 09/10/17 2048 97 %     Weight 09/10/17 2048 167 lb (75.8 kg)     Height 09/10/17 2048 5' (1.524 m)     Head Circumference --      Peak Flow --      Pain Score 09/10/17 2047 8     Pain Loc --      Pain Edu? --      Excl. in GC? --     Constitutional: Alert and oriented. Well appearing and in no acute distress. Eyes: Conjunctivae are normal.  Head: Atraumatic. Nose: No congestion/rhinnorhea. Mouth/Throat: Mucous membranes are moist.   Neck: Normal range of motion.  Cardiovascular: Normal rate, regular rhythm. Grossly normal heart sounds.  Good peripheral circulation.  Chest wall nontender.  Respiratory: Normal respiratory effort.  No retractions. Lungs CTAB. Gastrointestinal: Soft and nontender. No distention.  Genitourinary: No CVA tenderness. Musculoskeletal: No lower extremity edema.  Extremities warm and well perfused.  No calf or popliteal swelling or tenderness. Neurologic:  Normal speech and language. No gross focal neurologic deficits are appreciated.  Skin:  Skin is warm and dry. No rash noted. Psychiatric: Mood and affect are normal. Speech and behavior are normal.  ____________________________________________   LABS (all labs ordered are listed, but only abnormal results are displayed)  Labs Reviewed  BASIC METABOLIC PANEL - Abnormal; Notable for the following components:      Result Value   Potassium 3.2 (*)    Chloride 100 (*)    Glucose, Bld 138 (*)    All other components within normal limits  CBC  TROPONIN I  TROPONIN I   ____________________________________________  EKG  ED ECG REPORT I, Dionne BucySebastian Eulalah Rupert, the attending physician, personally  viewed and interpreted this ECG.  Date: 09/10/2017 EKG Time: 2041 Rate: 73 Rhythm: normal sinus rhythm QRS Axis: normal Intervals: normal ST/T Wave abnormalities: Nonspecific T wave flattening laterally Narrative Interpretation: no evidence of acute ischemia; no significant change when compared to EKG of 08/20/2017  ____________________________________________  RADIOLOGY  CXR: No focal infiltrate or other acute findings  ____________________________________________   PROCEDURES  Procedure(s) performed: No    Critical Care performed: No ____________________________________________   INITIAL IMPRESSION / ASSESSMENT AND PLAN / ED COURSE  Pertinent labs & imaging results that were available during my care of the patient were reviewed by me and considered in my medical decision making (see chart for details).  51 year old female with past medical history as noted above presents  with atypical and nonexertional chest discomfort for the last several hours, with some GERD-like symptoms earlier in the day.  Patient denies shortness of breath, nausea, or other significant associated symptoms.  Review of past medical records in Epic reveals multiple visits within the last year for similar presentations, earlier this month, in August, June, and April, all with negative ED workups.  On exam, patient is relatively well-appearing, vital signs here are normal, and the remainder the exam is unremarkable.  Overall presentation is consistent either with GERD, musculoskeletal chest wall pain, and/or component of anxiety.  Patient has no significant cardiac risk factors except for diabetes, and given no EKG changes, and the atypical nature of the pain, this presentation is not consistent with ACS.  Patient has no PE risk factors, no hypoxia or tachycardia to suggest PE, and no symptoms or exam findings to suggest DVT.  Although she does report some mild radiation of the pain to the back, given her  normal vital signs, comfortable appearance, and the negative chest x-ray, there is no evidence of aortic dissection or other vascular cause.  Initial workup is negative.  Will obtain repeat 3-hour troponin, and I will give Pepcid for symptomatic treatment.  Anticipate discharge home if negative ED workup.  Clinical Course as of Sep 11 2315  Sun Sep 10, 2017  2245 GFR, Est African American: >60 [SS]    Clinical Course User Index [SS] Dionne BucySiadecki, Shaneeka Scarboro, MD   ----------------------------------------- 11:16 PM on 09/10/2017 -----------------------------------------  I signed out the patient to the oncoming physician Dr. Derrill KayGoodman.  Pending repeat troponin.  If negative and patient remains stable, plan for discharge home.   ____________________________________________   FINAL CLINICAL IMPRESSION(S) / ED DIAGNOSES  Final diagnoses:  Nonspecific chest pain      NEW MEDICATIONS STARTED DURING THIS VISIT:  This SmartLink is deprecated. Use AVSMEDLIST instead to display the medication list for a patient.   Note:  This document was prepared using Dragon voice recognition software and may include unintentional dictation errors.     Dionne BucySiadecki, Brodee Mauritz, MD 09/10/17 2316

## 2017-09-11 LAB — TROPONIN I: Troponin I: <0.03 ng/mL (ref <0.03)

## 2017-09-11 MED ORDER — FAMOTIDINE 40 MG PO TABS: 40.0000 mg | ORAL_TABLET | Freq: Every evening | ORAL | 1 refills | Status: DC

## 2017-09-11 MED ORDER — POTASSIUM CHLORIDE CRYS ER 20 MEQ PO TBCR
40.0000 meq | EXTENDED_RELEASE_TABLET | Freq: Once | ORAL | Status: AC
  Administered 2017-09-11: 40 meq via ORAL
  Filled 2017-09-11: qty 2

## 2017-09-11 NOTE — ED Provider Notes (Signed)
Patient's second troponin was negative.  Patient did state that she felt better after antiacid.  I did discuss results with patient.  Will plan on discharging with antiacid prescription.  Will also give patient information on food choices.  Discussed with patient that she should return for any worsening or new symptoms.   Phineas SemenGoodman, Paula Zietz, MD 09/11/17 [phone]

## 2017-09-11 NOTE — Discharge Instructions (Signed)
Please seek medical attention for any high fevers, chest pain, shortness of breath, change in behavior, persistent vomiting, bloody stool or any other new or concerning symptoms.  

## 2017-09-28 ENCOUNTER — Other Ambulatory Visit: Payer: Self-pay | Admitting: Family Medicine

## 2017-09-28 DIAGNOSIS — R7401 Elevation of levels of liver transaminase levels: Secondary | ICD-10-CM

## 2017-09-28 DIAGNOSIS — R1032 Left lower quadrant pain: Secondary | ICD-10-CM

## 2017-09-28 DIAGNOSIS — R740 Nonspecific elevation of levels of transaminase and lactic acid dehydrogenase [LDH]: Secondary | ICD-10-CM

## 2017-10-04 ENCOUNTER — Ambulatory Visit
Admission: RE | Admit: 2017-10-04 | Discharge: 2017-10-04 | Disposition: A | Discharge status: Home / Self Care | Payer: 59 | Source: Ambulatory Visit | Attending: Family Medicine | Admitting: Family Medicine

## 2017-10-04 DIAGNOSIS — R7401 Elevation of levels of liver transaminase levels: Secondary | ICD-10-CM

## 2017-10-04 DIAGNOSIS — R1032 Left lower quadrant pain: Secondary | ICD-10-CM

## 2017-10-04 DIAGNOSIS — R740 Nonspecific elevation of levels of transaminase and lactic acid dehydrogenase [LDH]: Secondary | ICD-10-CM

## 2017-10-12 ENCOUNTER — Ambulatory Visit
Admission: RE | Admit: 2017-10-12 | Discharge: 2017-10-12 | Disposition: A | Discharge status: Home / Self Care | Payer: 59 | Source: Ambulatory Visit | Attending: Family Medicine | Admitting: Family Medicine

## 2017-10-12 DIAGNOSIS — R1032 Left lower quadrant pain: Secondary | ICD-10-CM | POA: Diagnosis not present

## 2017-10-12 DIAGNOSIS — R740 Nonspecific elevation of levels of transaminase and lactic acid dehydrogenase [LDH]: Secondary | ICD-10-CM | POA: Insufficient documentation

## 2017-10-18 DIAGNOSIS — G4733 Obstructive sleep apnea (adult) (pediatric): Secondary | ICD-10-CM | POA: Diagnosis not present

## 2017-11-16 ENCOUNTER — Encounter (HOSPITAL_COMMUNITY): Payer: Self-pay | Admitting: Emergency Medicine

## 2017-11-16 ENCOUNTER — Emergency Department (HOSPITAL_COMMUNITY)
Admission: EM | Admit: 2017-11-16 | Discharge: 2017-11-16 | Disposition: A | Discharge status: Home / Self Care | Payer: 59 | Attending: Emergency Medicine | Admitting: Emergency Medicine

## 2017-11-16 ENCOUNTER — Emergency Department (HOSPITAL_COMMUNITY): Payer: 59

## 2017-11-16 DIAGNOSIS — E119 Type 2 diabetes mellitus without complications: Secondary | ICD-10-CM | POA: Diagnosis not present

## 2017-11-16 DIAGNOSIS — R0602 Shortness of breath: Secondary | ICD-10-CM | POA: Diagnosis not present

## 2017-11-16 DIAGNOSIS — Z7982 Long term (current) use of aspirin: Secondary | ICD-10-CM | POA: Diagnosis not present

## 2017-11-16 DIAGNOSIS — R079 Chest pain, unspecified: Secondary | ICD-10-CM | POA: Diagnosis not present

## 2017-11-16 DIAGNOSIS — I10 Essential (primary) hypertension: Secondary | ICD-10-CM | POA: Insufficient documentation

## 2017-11-16 DIAGNOSIS — R197 Diarrhea, unspecified: Secondary | ICD-10-CM | POA: Insufficient documentation

## 2017-11-16 DIAGNOSIS — Z79899 Other long term (current) drug therapy: Secondary | ICD-10-CM | POA: Insufficient documentation

## 2017-11-16 DIAGNOSIS — R0789 Other chest pain: Secondary | ICD-10-CM | POA: Diagnosis not present

## 2017-11-16 DIAGNOSIS — Z87891 Personal history of nicotine dependence: Secondary | ICD-10-CM | POA: Insufficient documentation

## 2017-11-16 HISTORY — DX: Supraventricular tachycardia: I47.1

## 2017-11-16 HISTORY — DX: Supraventricular tachycardia, unspecified: I47.10

## 2017-11-16 LAB — I-STAT TROPONIN, ED
Troponin i, poc: 0 ng/mL (ref 0.00–0.08)
Troponin i, poc: 0 ng/mL (ref 0.00–0.08)

## 2017-11-16 LAB — CBC
HEMATOCRIT: 36.5 % (ref 36.0–46.0)
HEMOGLOBIN: 12.5 g/dL (ref 12.0–15.0)
MCH: 29.6 pg (ref 26.0–34.0)
MCHC: 34.2 g/dL (ref 30.0–36.0)
MCV: 86.3 fL (ref 78.0–100.0)
Platelets: 244 10*3/uL (ref 150–400)
RBC: 4.23 MIL/uL (ref 3.87–5.11)
RDW: 13 % (ref 11.5–15.5)
WBC: 5.4 10*3/uL (ref 4.0–10.5)

## 2017-11-16 LAB — BASIC METABOLIC PANEL
ANION GAP: 13 (ref 5–15)
BUN: 8 mg/dL (ref 6–20)
CHLORIDE: 99 mmol/L — AB (ref 101–111)
CO2: 26 mmol/L (ref 22–32)
Calcium: 9.5 mg/dL (ref 8.9–10.3)
Creatinine, Ser: 0.71 mg/dL (ref 0.44–1.00)
Glucose, Bld: 108 mg/dL — ABNORMAL HIGH (ref 65–99)
POTASSIUM: 3.4 mmol/L — AB (ref 3.5–5.1)
SODIUM: 138 mmol/L (ref 135–145)

## 2017-11-16 LAB — I-STAT BETA HCG BLOOD, ED (MC, WL, AP ONLY)

## 2017-11-16 MED ORDER — GI COCKTAIL ~~LOC~~
30.0000 mL | Freq: Once | ORAL | Status: AC
  Administered 2017-11-16: 30 mL via ORAL
  Filled 2017-11-16: qty 30

## 2017-11-16 MED ORDER — LIDOCAINE VISCOUS 2 % MT SOLN: 15.0000 mL | OROMUCOSAL | 0 refills | Status: DC | PRN

## 2017-11-16 NOTE — ED Notes (Signed)
PT states understanding of care given, follow up care, and medication prescribed. PT ambulated from ED to car with a steady gait. 

## 2017-11-16 NOTE — Discharge Instructions (Signed)
Please call your cardiologist and your gastroenterologist to schedule follow-up appointment within the next week.  Should also mention the tejocote root to your cardiologist and gastroenterologist to ensure that it does not have any side effects with your other medications.  Take 15 mL of viscous lidocaine every 3 hours as needed for burning chest pain.  You can also try over-the-counter treatments such as Maalox.  Use as directed on label.  If you develop new or worsening symptoms, including persistent chest pain chest pain that is worse with exertion, that radiates to her jaw or arm, or other new concerning symptoms, please return to the emergency department for reevaluation.

## 2017-11-16 NOTE — ED Triage Notes (Signed)
Pt to ED from work c/o central CP radiating to her back, describes as burning. Patient has hx SVT and reports intermittent palpitations from time to time, but none today. Patient states it was a 9/10 when it came on, has decreased to 3/10 following 324 ASA. [facility] Patient states she has had CP one time in the past, but it was not like this. EMS VS: 126/84, HR 61 NSR, RR 18, 97% RA, CBG 139. Resp e/u, skin warm/dry. Denies dizziness, SOB, or nausea. 18g. RAC. Patient adds that she had one episode of green colored diarrhea this morning - denies N/V/fevers/abd pain.

## 2017-11-16 NOTE — ED Provider Notes (Signed)
[facility] EMERGENCY DEPARTMENT Provider Note   CSN: [id number] Arrival date & time: 11/16/17  1208     History   Chief Complaint Chief Complaint  Patient presents with  . Chest Pain    HPI Felicia Barnett is a 52 y.o. female who presents to the emergency department with a chief complaint of chest pain.  The pain began at approximately 11 AM just after eating cookies while she was at work.  She reports that the nonradiating pain was present in both her chest and her back, was constant, and characterized as burning pressure.  She reports a history of one similar episode that occurred about a week ago, but the episode today was more intense.  She reports that she was initially short of breath, but thinks that it was because she was anxious about the pain.  She reports that the dyspnea resolved within minutes.  No aggravating or alleviating factors.  She reports that the pain has now mostly resolved.  She denies palpitations, leg swelling, dizziness, lightheadedness, headache, jaw or arm pain.  No nausea, vomiting, or abdominal pain.  She reports one episode of green loose stool today.  She reports that she has been taking a new supplements over the last 3 months called tejocote root that she states is to help her lose weight.  She reports that she has lost 11 pounds since she began taking a supplement.  She reports that at first she was taking it daily, but she has started taking it every 2-3 days.  Last dose was yesterday morning.   No treatment prior to arrival.  She is established with both gastroenterology and cardiology.  She has a history of cardiac ablation and has previously been worked up for palpitations.  The history is provided by the patient. No language interpreter was used.    Past Medical History:  Diagnosis Date  . Anxiety    since surgery, pt has had attacks w/ fear of not being able to move her leg (anxiety attacks occur  3-4x month.)  . Diabetes  mellitus without complication (HCC)    pre-diabetes  . Heart murmur    palpatiations  . Hyperlipidemia   . Hypertension   . SVT (supraventricular tachycardia) (HCC)     There are no active problems to display for this patient.   Past Surgical History:  Procedure Laterality Date  . ABDOMINAL HYSTERECTOMY    . cardial ablation     anxiety attack (heart palpitations HR=210 bpm) the day she was released from the hospital from surgery. Findings were neg .  Marland Kitchen COLONOSCOPY      OB History    No data available       Home Medications    Prior to Admission medications   Medication Sig Start Date End Date Taking? Authorizing Provider  aspirin EC 81 MG tablet Take 81 mg by mouth 2 (two) times a week.    Yes [provider]  calcium carbonate (TUMS - DOSED IN MG ELEMENTAL CALCIUM) 500 MG chewable tablet Chew 1-2 tablets by mouth 2 (two) times daily as needed for indigestion or heartburn.    Yes [provider]  chlorthalidone (HYGROTON) 25 MG tablet Take 25 mg by mouth daily.   Yes [provider]  famotidine (PEPCID) 40 MG tablet Take 1 tablet (40 mg total) by mouth every evening. Patient taking differently: Take 40 mg by mouth daily.  09/11/17 09/11/18 Yes Phineas Semen, MD  gabapentin (NEURONTIN) 300 MG  capsule Take 300 mg by mouth daily as needed (for nerve pain).   Yes [provider]  KRILL OIL PO Take 1 capsule by mouth daily.   Yes [provider]  nebivolol (BYSTOLIC) 5 MG tablet Take 5 mg by mouth 2 (two) times daily.    Yes [provider]  ondansetron (ZOFRAN) 4 MG tablet Take 1 tablet (4 mg total) by mouth daily as needed for nausea or vomiting. 05/17/17 05/17/18 Yes Willy Eddy, MD  potassium chloride 20 MEQ TBCR Take 20 mEq by mouth daily. 06/08/15  Yes Rebecka Apley, MD  valACYclovir (VALTREX) 500 MG tablet Take 500 mg by mouth 2 (two) times daily as needed (for flares).    Yes [provider]  lidocaine  (XYLOCAINE) 2 % solution Use as directed 15 mLs in the mouth or throat every 3 (three) hours as needed for mouth pain. 11/16/17   Lennin Osmond A, PA-C    Family History Family History  Problem Relation Age of Onset  . Breast cancer Neg Hx     Social History Social History   Tobacco Use  . Smoking status: Former Smoker    Years: 20.00    Last attempt to quit: 03/02/2009    Years since quitting: 8.7  . Smokeless tobacco: Never Used  Substance Use Topics  . Alcohol use: Yes    Comment: occ  . Drug use: No     Allergies   Patient has no known allergies.   Review of Systems Review of Systems  Constitutional: Negative for activity change, chills and fever.  HENT: Negative for congestion.   Eyes: Negative for visual disturbance.  Respiratory: Positive for shortness of breath.   Cardiovascular: Positive for chest pain.  Gastrointestinal: Positive for diarrhea. Negative for abdominal pain, nausea and vomiting.  Genitourinary: Negative for dysuria.  Musculoskeletal: Negative for back pain, neck pain and neck stiffness.  Skin: Negative for rash.  Allergic/Immunologic: Negative for immunocompromised state.  Neurological: Negative for dizziness, weakness, light-headedness and headaches.  Psychiatric/Behavioral: Negative for confusion.     Physical Exam Updated Vital Signs BP 116/77   Pulse 61   Temp 97.9 F (36.6 C) (Oral)   Resp 16   Ht 5' (1.524 m)   Wt 73 kg (161 lb)   SpO2 98%   BMI 31.44 kg/m   Physical Exam  Constitutional: She is oriented to person, place, and time. No distress.  Patient appears comfortable and in no acute distress.  HENT:  Head: Normocephalic.  Eyes: Conjunctivae are normal. No scleral icterus.  Neck: Normal range of motion. Neck supple. No JVD present.  Cardiovascular: Normal rate, regular rhythm, normal heart sounds and intact distal pulses. Exam reveals no gallop and no friction rub.  No murmur heard. Pulmonary/Chest: Effort normal. No  stridor. No respiratory distress. She has no wheezes. She has no rales. She exhibits no tenderness.  Abdominal: Soft. She exhibits no distension and no mass. There is no tenderness. There is no rebound and no guarding. No hernia.  Musculoskeletal: Normal range of motion. She exhibits no tenderness.  Lymphadenopathy:    She has no cervical adenopathy.  Neurological: She is alert and oriented to person, place, and time.  Skin: Skin is warm. Capillary refill takes less than 2 seconds. No rash noted. She is not diaphoretic.  Psychiatric: Her behavior is normal.  Nursing note and vitals reviewed.    ED Treatments / Results  Labs (all labs ordered are listed, but only abnormal results are  displayed) Labs Reviewed  BASIC METABOLIC PANEL - Abnormal; Notable for the following components:      Result Value   Potassium 3.4 (*)    Chloride 99 (*)    Glucose, Bld 108 (*)    All other components within normal limits  CBC  I-STAT TROPONIN, ED  I-STAT BETA HCG BLOOD, ED ([facility], [facility], [facility] ONLY)  I-STAT TROPONIN, ED    EKG  EKG Interpretation  Date/Time:  Thursday November 16 2017 12:21:32 EST Ventricular Rate:  62 PR Interval:  152 QRS Duration: 74 QT Interval:  386 QTC Calculation: 391 R Axis:   14 Text Interpretation:  Normal sinus rhythm Nonspecific T wave abnormality Abnormal ECG nonspecific T waves similar to Dec 2018 Confirmed by Pricilla LovelessGoldston, Scott [phone]([id number]) on 11/16/2017 9:41:33 PM       Radiology Dg Chest 2 View  Result Date: 11/16/2017 CLINICAL DATA:  Chest pain EXAM: CHEST  2 VIEW COMPARISON:  09/10/2017 FINDINGS: The heart size and mediastinal contours are within normal limits. Both lungs are clear. The visualized skeletal structures are unremarkable. IMPRESSION: No active cardiopulmonary disease. Electronically Signed   By: Alcide CleverMark  Lukens M.D.   On: 11/16/2017 13:05    Procedures Procedures (including critical care time)  Medications Ordered in ED Medications  gi cocktail  (Maalox,Lidocaine,Donnatal) (30 mLs Oral Given 11/16/17 2043)     Initial Impression / Assessment and Plan / ED Course  I have reviewed the triage vital signs and the nursing notes.  Pertinent labs & imaging results that were available during my care of the patient were reviewed by me and considered in my medical decision making (see chart for details).     Patient is to be discharged with recommendation to follow up with PCP in regards to today's hospital visit. Chest pain is not likely of cardiac or pulmonary etiology d/t presentation, PERC negative, VSS, no tracheal deviation, no JVD or new murmur, RRR, breath sounds equal bilaterally, EKG without acute abnormalities, negative troponin, and negative CXR.  She was given a GI cocktail in the emergency department, which resolved her remaining pain.  I reviewed her medical record, which includes similar episodes of pain in the past with burning that is present in both the chest and the back. Pt has been advised to return to the ED if CP becomes exertional, associated with diaphoresis or nausea, radiates to left jaw/arm, worsens or becomes concerning in any way.  Will discharge her with a prescription for viscous lidocaine and recommended follow-up to both gastroenterology and cardiology regarding today's visit.  Pt appears reliable for follow up and is agreeable to discharge.   Final Clinical Impressions(s) / ED Diagnoses   Final diagnoses:  Atypical chest pain    ED Discharge Orders        Ordered    lidocaine (XYLOCAINE) 2 % solution  Every  3 hours PRN     11/16/17 2126       Walker Sitar A, PA-C 11/16/17 2206    Pricilla LovelessGoldston, Scott, MD 11/17/17 2224

## 2017-11-21 ENCOUNTER — Other Ambulatory Visit: Payer: Self-pay | Admitting: Gastroenterology

## 2017-11-21 DIAGNOSIS — R109 Unspecified abdominal pain: Secondary | ICD-10-CM | POA: Diagnosis not present

## 2017-11-21 DIAGNOSIS — K5730 Diverticulosis of large intestine without perforation or abscess without bleeding: Secondary | ICD-10-CM

## 2017-11-21 DIAGNOSIS — K625 Hemorrhage of anus and rectum: Secondary | ICD-10-CM

## 2017-11-24 ENCOUNTER — Ambulatory Visit
Admission: RE | Admit: 2017-11-24 | Discharge: 2017-11-24 | Disposition: A | Discharge status: Home / Self Care | Payer: 59 | Source: Ambulatory Visit | Attending: Gastroenterology | Admitting: Gastroenterology

## 2017-11-24 DIAGNOSIS — R109 Unspecified abdominal pain: Secondary | ICD-10-CM

## 2017-11-24 DIAGNOSIS — K625 Hemorrhage of anus and rectum: Secondary | ICD-10-CM

## 2017-11-24 DIAGNOSIS — K5730 Diverticulosis of large intestine without perforation or abscess without bleeding: Secondary | ICD-10-CM | POA: Diagnosis not present

## 2017-11-24 MED ORDER — IOPAMIDOL (ISOVUE-300) INJECTION 61%
100.0000 mL | Freq: Once | INTRAVENOUS | Status: AC | PRN
  Administered 2017-11-24: 100 mL via INTRAVENOUS

## 2017-12-21 DIAGNOSIS — R0789 Other chest pain: Secondary | ICD-10-CM | POA: Diagnosis not present

## 2017-12-21 DIAGNOSIS — R109 Unspecified abdominal pain: Secondary | ICD-10-CM | POA: Diagnosis not present

## 2017-12-21 DIAGNOSIS — K219 Gastro-esophageal reflux disease without esophagitis: Secondary | ICD-10-CM | POA: Diagnosis not present

## 2017-12-21 DIAGNOSIS — E785 Hyperlipidemia, unspecified: Secondary | ICD-10-CM | POA: Diagnosis not present

## 2018-01-23 DIAGNOSIS — M5416 Radiculopathy, lumbar region: Secondary | ICD-10-CM | POA: Diagnosis not present

## 2018-01-23 DIAGNOSIS — E559 Vitamin D deficiency, unspecified: Secondary | ICD-10-CM | POA: Diagnosis not present

## 2018-01-23 DIAGNOSIS — M25552 Pain in left hip: Secondary | ICD-10-CM | POA: Diagnosis not present

## 2018-01-23 DIAGNOSIS — R29898 Other symptoms and signs involving the musculoskeletal system: Secondary | ICD-10-CM | POA: Diagnosis not present

## 2018-01-23 DIAGNOSIS — R200 Anesthesia of skin: Secondary | ICD-10-CM | POA: Diagnosis not present

## 2018-01-23 DIAGNOSIS — E538 Deficiency of other specified B group vitamins: Secondary | ICD-10-CM | POA: Diagnosis not present

## 2018-01-23 DIAGNOSIS — Z8639 Personal history of other endocrine, nutritional and metabolic disease: Secondary | ICD-10-CM | POA: Diagnosis not present

## 2018-02-06 DIAGNOSIS — G4733 Obstructive sleep apnea (adult) (pediatric): Secondary | ICD-10-CM | POA: Diagnosis not present

## 2018-02-12 DIAGNOSIS — R200 Anesthesia of skin: Secondary | ICD-10-CM | POA: Diagnosis not present

## 2018-02-28 DIAGNOSIS — R29898 Other symptoms and signs involving the musculoskeletal system: Secondary | ICD-10-CM | POA: Diagnosis not present

## 2018-02-28 DIAGNOSIS — R200 Anesthesia of skin: Secondary | ICD-10-CM | POA: Diagnosis not present

## 2018-03-20 DIAGNOSIS — R000 Tachycardia, unspecified: Secondary | ICD-10-CM | POA: Diagnosis not present

## 2018-03-20 DIAGNOSIS — I10 Essential (primary) hypertension: Secondary | ICD-10-CM | POA: Diagnosis not present

## 2018-03-20 DIAGNOSIS — I471 Supraventricular tachycardia: Secondary | ICD-10-CM | POA: Diagnosis not present

## 2018-03-20 DIAGNOSIS — R002 Palpitations: Secondary | ICD-10-CM | POA: Diagnosis not present

## 2018-03-28 DIAGNOSIS — I491 Atrial premature depolarization: Secondary | ICD-10-CM | POA: Diagnosis not present

## 2018-03-29 DIAGNOSIS — R002 Palpitations: Secondary | ICD-10-CM | POA: Diagnosis not present

## 2018-03-29 DIAGNOSIS — E7800 Pure hypercholesterolemia, unspecified: Secondary | ICD-10-CM | POA: Diagnosis not present

## 2018-03-29 DIAGNOSIS — R000 Tachycardia, unspecified: Secondary | ICD-10-CM | POA: Diagnosis not present

## 2018-03-31 DIAGNOSIS — R102 Pelvic and perineal pain: Secondary | ICD-10-CM | POA: Diagnosis not present

## 2018-04-02 ENCOUNTER — Other Ambulatory Visit: Payer: Self-pay | Admitting: Family Medicine

## 2018-04-02 DIAGNOSIS — Z1231 Encounter for screening mammogram for malignant neoplasm of breast: Secondary | ICD-10-CM

## 2018-04-02 DIAGNOSIS — R102 Pelvic and perineal pain: Secondary | ICD-10-CM | POA: Diagnosis not present

## 2018-05-01 DIAGNOSIS — R200 Anesthesia of skin: Secondary | ICD-10-CM | POA: Diagnosis not present

## 2018-05-01 DIAGNOSIS — E538 Deficiency of other specified B group vitamins: Secondary | ICD-10-CM | POA: Diagnosis not present

## 2018-05-01 DIAGNOSIS — E559 Vitamin D deficiency, unspecified: Secondary | ICD-10-CM | POA: Diagnosis not present

## 2018-05-01 DIAGNOSIS — M5416 Radiculopathy, lumbar region: Secondary | ICD-10-CM | POA: Diagnosis not present

## 2018-05-01 DIAGNOSIS — G514 Facial myokymia: Secondary | ICD-10-CM | POA: Diagnosis not present

## 2018-05-10 ENCOUNTER — Other Ambulatory Visit: Payer: Self-pay | Admitting: Neurology

## 2018-05-10 DIAGNOSIS — G514 Facial myokymia: Secondary | ICD-10-CM

## 2018-05-13 ENCOUNTER — Ambulatory Visit
Admission: RE | Admit: 2018-05-13 | Discharge: 2018-05-13 | Disposition: A | Discharge status: Home / Self Care | Payer: 59 | Source: Ambulatory Visit | Attending: Neurology | Admitting: Neurology

## 2018-05-13 DIAGNOSIS — R202 Paresthesia of skin: Secondary | ICD-10-CM | POA: Diagnosis not present

## 2018-05-13 DIAGNOSIS — G514 Facial myokymia: Secondary | ICD-10-CM | POA: Diagnosis not present

## 2018-05-13 MED ORDER — GADOBENATE DIMEGLUMINE 529 MG/ML IV SOLN
15.0000 mL | Freq: Once | INTRAVENOUS | Status: AC | PRN
  Administered 2018-05-13: 15 mL via INTRAVENOUS

## 2018-05-14 ENCOUNTER — Ambulatory Visit
Admission: RE | Admit: 2018-05-14 | Discharge: 2018-05-14 | Disposition: A | Discharge status: Home / Self Care | Payer: 59 | Source: Ambulatory Visit | Attending: Family Medicine | Admitting: Family Medicine

## 2018-05-14 DIAGNOSIS — Z1231 Encounter for screening mammogram for malignant neoplasm of breast: Secondary | ICD-10-CM | POA: Insufficient documentation

## 2018-06-15 DIAGNOSIS — J069 Acute upper respiratory infection, unspecified: Secondary | ICD-10-CM | POA: Diagnosis not present

## 2018-06-21 DIAGNOSIS — I471 Supraventricular tachycardia: Secondary | ICD-10-CM | POA: Diagnosis not present

## 2018-06-21 DIAGNOSIS — R000 Tachycardia, unspecified: Secondary | ICD-10-CM | POA: Diagnosis not present

## 2018-06-21 DIAGNOSIS — R002 Palpitations: Secondary | ICD-10-CM | POA: Diagnosis not present

## 2018-07-03 ENCOUNTER — Other Ambulatory Visit: Payer: Self-pay

## 2018-07-03 ENCOUNTER — Encounter: Payer: Self-pay | Admitting: *Deleted

## 2018-07-03 ENCOUNTER — Emergency Department: Payer: 59

## 2018-07-03 DIAGNOSIS — Z87891 Personal history of nicotine dependence: Secondary | ICD-10-CM | POA: Insufficient documentation

## 2018-07-03 DIAGNOSIS — Z7982 Long term (current) use of aspirin: Secondary | ICD-10-CM | POA: Diagnosis not present

## 2018-07-03 DIAGNOSIS — Z79899 Other long term (current) drug therapy: Secondary | ICD-10-CM | POA: Insufficient documentation

## 2018-07-03 DIAGNOSIS — R2241 Localized swelling, mass and lump, right lower limb: Secondary | ICD-10-CM | POA: Diagnosis not present

## 2018-07-03 DIAGNOSIS — I10 Essential (primary) hypertension: Secondary | ICD-10-CM | POA: Insufficient documentation

## 2018-07-03 DIAGNOSIS — R079 Chest pain, unspecified: Secondary | ICD-10-CM | POA: Insufficient documentation

## 2018-07-03 DIAGNOSIS — M7989 Other specified soft tissue disorders: Secondary | ICD-10-CM | POA: Diagnosis not present

## 2018-07-03 DIAGNOSIS — E119 Type 2 diabetes mellitus without complications: Secondary | ICD-10-CM | POA: Insufficient documentation

## 2018-07-03 DIAGNOSIS — E876 Hypokalemia: Secondary | ICD-10-CM | POA: Diagnosis not present

## 2018-07-03 DIAGNOSIS — M79661 Pain in right lower leg: Secondary | ICD-10-CM | POA: Diagnosis not present

## 2018-07-03 DIAGNOSIS — R0789 Other chest pain: Secondary | ICD-10-CM | POA: Diagnosis not present

## 2018-07-03 LAB — BASIC METABOLIC PANEL
ANION GAP: 7 (ref 5–15)
BUN: 15 mg/dL (ref 6–20)
CHLORIDE: 102 mmol/L (ref 98–111)
CO2: 27 mmol/L (ref 22–32)
Calcium: 9.2 mg/dL (ref 8.9–10.3)
Creatinine, Ser: 0.75 mg/dL (ref 0.44–1.00)
GFR calc Af Amer: >60 mL/min (ref >60)
GLUCOSE: 116 mg/dL — AB (ref 70–99)
POTASSIUM: 3.1 mmol/L — AB (ref 3.5–5.1)
Sodium: 136 mmol/L (ref 135–145)

## 2018-07-03 LAB — CBC
HEMATOCRIT: 35.1 % (ref 35.0–47.0)
HEMOGLOBIN: 12.6 g/dL (ref 12.0–16.0)
MCH: 32.1 pg (ref 26.0–34.0)
MCHC: 35.9 g/dL (ref 32.0–36.0)
MCV: 89.3 fL (ref 80.0–100.0)
Platelets: 231 10*3/uL (ref 150–440)
RBC: 3.93 MIL/uL (ref 3.80–5.20)
RDW: 13.1 % (ref 11.5–14.5)
WBC: 8.1 10*3/uL (ref 3.6–11.0)

## 2018-07-03 LAB — TROPONIN I: Troponin I: <0.03 ng/mL (ref <0.03)

## 2018-07-03 NOTE — ED Triage Notes (Signed)
Pt to ED reporting new onset of centralized chest pains, left arm pain and tingling, elevated BP and a nose bleed that has since subsided. Pt has a hx of HTN and reports she is complient with all medications. During nose bleed today pt checked BP and reports it was elevated above her normal but is unsure of exact numbers. No neuro deficits noted. NO SOB, dizziness, NVD.

## 2018-07-04 ENCOUNTER — Emergency Department: Payer: 59

## 2018-07-04 ENCOUNTER — Emergency Department
Admission: EM | Admit: 2018-07-04 | Discharge: 2018-07-04 | Disposition: A | Discharge status: Home / Self Care | Payer: 59 | Attending: Emergency Medicine | Admitting: Emergency Medicine

## 2018-07-04 DIAGNOSIS — R079 Chest pain, unspecified: Secondary | ICD-10-CM

## 2018-07-04 DIAGNOSIS — M7989 Other specified soft tissue disorders: Secondary | ICD-10-CM | POA: Diagnosis not present

## 2018-07-04 DIAGNOSIS — M79661 Pain in right lower leg: Secondary | ICD-10-CM | POA: Diagnosis not present

## 2018-07-04 DIAGNOSIS — R0789 Other chest pain: Secondary | ICD-10-CM | POA: Diagnosis not present

## 2018-07-04 DIAGNOSIS — E876 Hypokalemia: Secondary | ICD-10-CM

## 2018-07-04 DIAGNOSIS — E785 Hyperlipidemia, unspecified: Secondary | ICD-10-CM | POA: Diagnosis not present

## 2018-07-04 LAB — TROPONIN I

## 2018-07-04 MED ORDER — POTASSIUM CHLORIDE CRYS ER 20 MEQ PO TBCR
40.0000 meq | EXTENDED_RELEASE_TABLET | Freq: Once | ORAL | Status: AC
  Administered 2018-07-04: 40 meq via ORAL
  Filled 2018-07-04: qty 2

## 2018-07-04 MED ORDER — POTASSIUM CHLORIDE 20 MEQ PO PACK: 40.0000 meq | PACK | Freq: Once | ORAL | Status: DC

## 2018-07-04 NOTE — ED Provider Notes (Signed)
[facility] Emergency Department Provider Note    First MD Initiated Contact with Patient 07/04/18 [phone]     (approximate)  I have reviewed the triage vital signs and the nursing notes.   HISTORY  Chief Complaint Chest Pain; Hypertension; and Epistaxis    HPI Felicia Barnett is a 52 y.o. female with below list of chronic medical conditions including SVT hypertension hyperlipidemia diabetes presents to the emergency department with acute onset of central chest pain that radiates to the left arm which lasted approximately 1 hour followed by spontaneous resolution.  Patient denies any dyspnea no nausea or vomiting.  Patient denies any diaphoresis.  She states the chest pain completely resolved at this time.  Patient also admits to right lower extremity swelling that has been occurring for "a while".  Past Medical History:  Diagnosis Date  . Anxiety    since surgery, pt has had attacks w/ fear of not being able to move her leg (anxiety attacks occur  3-4x month.)  . Diabetes mellitus without complication (HCC)    pre-diabetes  . Heart murmur    palpatiations  . Hyperlipidemia   . Hypertension   . SVT (supraventricular tachycardia) (HCC)     There are no active problems to display for this patient.   Past Surgical History:  Procedure Laterality Date  . ABDOMINAL HYSTERECTOMY    . BREAST BIOPSY Right    neg  . cardial ablation     anxiety attack (heart palpitations HR=210 bpm) the day she was released from the hospital from surgery. Findings were neg .  Marland Kitchen COLONOSCOPY      Prior to Admission medications   Medication Sig Start Date End Date Taking? Authorizing Provider  aspirin EC 81 MG tablet Take 81 mg by mouth 2 (two) times a week.     [provider]  calcium carbonate (TUMS - DOSED IN MG ELEMENTAL CALCIUM) 500 MG chewable tablet Chew 1-2 tablets by mouth 2 (two) times daily as needed for indigestion or heartburn.     [provider]  chlorthalidone (HYGROTON) 25 MG tablet Take 25 mg by mouth daily.    [provider]  famotidine (PEPCID) 40 MG tablet Take 1 tablet (40 mg total) by mouth every evening. Patient taking differently: Take 40 mg by mouth daily.  09/11/17 09/11/18  Phineas Semen, MD  gabapentin (NEURONTIN) 300 MG capsule Take 300 mg by mouth daily as needed (for nerve pain).    [provider]  KRILL OIL PO Take 1 capsule by mouth daily.    [provider]  lidocaine (XYLOCAINE) 2 % solution Use as directed 15 mLs in the mouth or throat every 3 (three) hours as needed for mouth pain. 11/16/17   McDonald, Mia A, PA-C  nebivolol (BYSTOLIC) 5 MG tablet Take 5 mg by mouth 2 (two) times daily.     [provider]  potassium chloride 20 MEQ TBCR Take 20 mEq by mouth daily. 06/08/15   Rebecka Apley, MD  valACYclovir (VALTREX) 500 MG tablet Take 500 mg by mouth 2 (two) times daily as needed (for flares).     [provider]    Allergies No known drug allergies  Family History  Problem Relation Age of Onset  . Breast cancer Neg Hx     Social History Social History   Tobacco Use  . Smoking status: Former Smoker    Years: 20.00    Last attempt to quit: 03/02/2009  Years since quitting: 9.3  . Smokeless tobacco: Never Used  Substance Use Topics  . Alcohol use: Yes    Comment: occ  . Drug use: No    Review of Systems Constitutional: No fever/chills Eyes: No visual changes. ENT: No sore throat. Cardiovascular: Positive for chest pain. Respiratory: Denies shortness of breath. Gastrointestinal: No abdominal pain.  No nausea, no vomiting.  No diarrhea.  No constipation. Genitourinary: Negative for dysuria. Musculoskeletal: Negative for neck pain.  Negative for back pain. Integumentary: Negative for rash. Neurological: Negative for headaches, focal weakness or numbness.  ____________________________________________   PHYSICAL EXAM:  VITAL  SIGNS: ED Triage Vitals  Enc Vitals Group     BP 07/03/18 2203 (!) 146/90     Pulse Rate 07/03/18 2203 92     Resp 07/03/18 2203 16     Temp 07/03/18 2203 98.1 F (36.7 C)     Temp Source 07/03/18 2203 Oral     SpO2 07/03/18 2203 99 %     Weight 07/03/18 2200 73 kg (160 lb 15 oz)     Height --      Head Circumference --      Peak Flow --      Pain Score 07/03/18 2200 5     Pain Loc --      Pain Edu? --      Excl. in GC? --     Constitutional: Alert and oriented. Well appearing and in no acute distress. Eyes: Conjunctivae are normal. PERRL. EOMI. Head: Atraumatic. Mouth/Throat: Mucous membranes are moist.  Oropharynx non-erythematous. Neck: No stridor.   Cardiovascular: Normal rate, regular rhythm. Good peripheral circulation. Grossly normal heart sounds. Respiratory: Normal respiratory effort.  No retractions. Lungs CTAB. Gastrointestinal: Soft and nontender. No distention.   Musculoskeletal: Plus nonpitting edema right lower extremity.. No gross deformities of extremities. Neurologic:  Normal speech and language. No gross focal neurologic deficits are appreciated.  Skin:  Skin is warm, dry and intact. No rash noted. Psychiatric: Mood and affect are normal. Speech and behavior are normal.  ____________________________________________   LABS (all labs ordered are listed, but only abnormal results are displayed)  Labs Reviewed  BASIC METABOLIC PANEL - Abnormal; Notable for the following components:      Result Value   Potassium 3.1 (*)    Glucose, Bld 116 (*)    All other components within normal limits  CBC  TROPONIN I  TROPONIN I   ____________________________________________  EKG  ED ECG REPORT I, Ravena N Londin Antone, the attending physician, personally viewed and interpreted this ECG.   Date: 07/04/2018  EKG Time: 10:00 PM  Rate: 64  Rhythm: Normal sinus rhythm  Axis: Normal  Intervals: Normal  ST&T Change:  None  ____________________________________________  RADIOLOGY I, Adair N Sasan Wilkie, personally viewed and evaluated these images (plain radiographs) as part of my medical decision making, as well as reviewing the written report by the radiologist.  ED MD interpretation: No active cardiopulmonary disease noted on chest x-ray. Right lower extremity venous ultrasound revealed no evidence of DVT.  Official radiology report(s): Dg Chest 2 View  Result Date: 07/03/2018 CLINICAL DATA:  Central chest pain EXAM: CHEST - 2 VIEW COMPARISON:  11/16/2017 FINDINGS: The heart size and mediastinal contours are within normal limits. Pneumothorax or effusion. No confluent airspace disease. Tiny density in the right upper lobe may represent a branch point for pulmonary vessel. This appears less prominent than on prior. The visualized skeletal structures are unremarkable. IMPRESSION: No active cardiopulmonary disease. Electronically  Signed   By: Tollie Eth M.D.   On: 07/03/2018 22:49   US Venous Img Lower Unilateral Right  Result Date: 07/04/2018 CLINICAL DATA:  52 year old female with right lower extremity swelling and pain. EXAM: Right LOWER EXTREMITY VENOUS DOPPLER ULTRASOUND TECHNIQUE: Gray-scale sonography with graded compression, as well as color Doppler and duplex ultrasound were performed to evaluate the lower extremity deep venous systems from the level of the common femoral vein and including the common femoral, femoral, profunda femoral, popliteal and calf veins including the posterior tibial, peroneal and gastrocnemius veins when visible. The superficial great saphenous vein was also interrogated. Spectral Doppler was utilized to evaluate flow at rest and with distal augmentation maneuvers in the common femoral, femoral and popliteal veins. COMPARISON:  None. FINDINGS: Contralateral Common Femoral Vein: Respiratory phasicity is normal and symmetric with the symptomatic side. No evidence of thrombus. Normal  compressibility. Common Femoral Vein: No evidence of thrombus. Normal compressibility, respiratory phasicity and response to augmentation. Saphenofemoral Junction: No evidence of thrombus. Normal compressibility and flow on color Doppler imaging. Profunda Femoral Vein: No evidence of thrombus. Normal compressibility and flow on color Doppler imaging. Femoral Vein: No evidence of thrombus. Normal compressibility, respiratory phasicity and response to augmentation. Popliteal Vein: No evidence of thrombus. Normal compressibility, respiratory phasicity and response to augmentation. Calf Veins: No evidence of thrombus. Normal compressibility and flow on color Doppler imaging. Superficial Great Saphenous Vein: No evidence of thrombus. Normal compressibility. Venous Reflux:  None. Other Findings:  None. IMPRESSION: No evidence of deep venous thrombosis. Electronically Signed   By: Elgie Collard M.D.   On: 07/04/2018 01:51    ___________________________________  Procedures   ____________________________________________   INITIAL IMPRESSION / ASSESSMENT AND PLAN / ED COURSE  As part of my medical decision making, I reviewed the following data within the electronic MEDICAL RECORD NUMBER   52 year old female presenting with above-stated history and physical exam secondary to chest pain which is resolved.  EKG revealed no evidence of ischemia or infarction.  Troponin x2-.  Given patient's chronic history of right lower extremity swelling ultrasound was performed which revealed no evidence of DVT.  Patient is asymptomatic at present without any complaints.  Patient advised to follow-up with Dr. Darrold Junker cardiologist for further outpatient evaluation ____________________________________________  FINAL CLINICAL IMPRESSION(S) / ED DIAGNOSES  Final diagnoses:  Chest pain, unspecified type  Hypokalemia     MEDICATIONS GIVEN DURING THIS VISIT:  Medications  potassium chloride SA (K-DUR,KLOR-CON) CR tablet  40 mEq (40 mEq Oral Given 07/04/18 0423)     ED Discharge Orders    None       Note:  This document was prepared using Dragon voice recognition software and may include unintentional dictation errors.    Darci Current, MD 07/04/18 [id number]

## 2018-07-05 DIAGNOSIS — E559 Vitamin D deficiency, unspecified: Secondary | ICD-10-CM | POA: Diagnosis not present

## 2018-07-05 DIAGNOSIS — G4733 Obstructive sleep apnea (adult) (pediatric): Secondary | ICD-10-CM | POA: Diagnosis not present

## 2018-07-05 DIAGNOSIS — E538 Deficiency of other specified B group vitamins: Secondary | ICD-10-CM | POA: Diagnosis not present

## 2018-07-05 DIAGNOSIS — E785 Hyperlipidemia, unspecified: Secondary | ICD-10-CM | POA: Diagnosis not present

## 2018-07-06 DIAGNOSIS — G4733 Obstructive sleep apnea (adult) (pediatric): Secondary | ICD-10-CM | POA: Diagnosis not present

## 2018-09-26 DIAGNOSIS — Z1389 Encounter for screening for other disorder: Secondary | ICD-10-CM | POA: Diagnosis not present

## 2018-09-26 DIAGNOSIS — N390 Urinary tract infection, site not specified: Secondary | ICD-10-CM | POA: Diagnosis not present

## 2018-10-05 DIAGNOSIS — G4733 Obstructive sleep apnea (adult) (pediatric): Secondary | ICD-10-CM | POA: Diagnosis not present

## 2018-10-11 ENCOUNTER — Encounter: Payer: Self-pay | Admitting: Emergency Medicine

## 2018-10-11 ENCOUNTER — Other Ambulatory Visit: Payer: Self-pay

## 2018-10-11 ENCOUNTER — Emergency Department
Admission: EM | Admit: 2018-10-11 | Discharge: 2018-10-11 | Disposition: A | Discharge status: Home / Self Care | Payer: 59 | Attending: Emergency Medicine | Admitting: Emergency Medicine

## 2018-10-11 DIAGNOSIS — Z79899 Other long term (current) drug therapy: Secondary | ICD-10-CM | POA: Diagnosis not present

## 2018-10-11 DIAGNOSIS — I10 Essential (primary) hypertension: Secondary | ICD-10-CM | POA: Insufficient documentation

## 2018-10-11 DIAGNOSIS — J0100 Acute maxillary sinusitis, unspecified: Secondary | ICD-10-CM | POA: Diagnosis not present

## 2018-10-11 DIAGNOSIS — Z87891 Personal history of nicotine dependence: Secondary | ICD-10-CM | POA: Diagnosis not present

## 2018-10-11 DIAGNOSIS — R0981 Nasal congestion: Secondary | ICD-10-CM | POA: Diagnosis present

## 2018-10-11 DIAGNOSIS — Z7982 Long term (current) use of aspirin: Secondary | ICD-10-CM | POA: Insufficient documentation

## 2018-10-11 LAB — INFLUENZA PANEL BY PCR (TYPE A & B)
INFLBPCR: NEGATIVE
Influenza A By PCR: NEGATIVE

## 2018-10-11 MED ORDER — AMOXICILLIN-POT CLAVULANATE 875-125 MG PO TABS: 1.0000 | ORAL_TABLET | Freq: Two times a day (BID) | ORAL | 0 refills | Status: DC

## 2018-10-11 MED ORDER — FLUTICASONE PROPIONATE 50 MCG/ACT NA SUSP: 1.0000 | Freq: Two times a day (BID) | NASAL | 0 refills | Status: DC

## 2018-10-11 NOTE — ED Triage Notes (Signed)
PT c/o headache and generalized body aches x3days. Pt children have been sick at home. VSS, low grade fever

## 2018-10-11 NOTE — ED Notes (Signed)
See triage note  Presents with body aches ,fever and cough for about 3 days  Low grade fever noted on arrival

## 2018-10-11 NOTE — ED Notes (Signed)
Flu specimen sent to lab

## 2018-10-11 NOTE — ED Provider Notes (Signed)
[facility] Emergency Department Provider Note  ____________________________________________  Time seen: Approximately 7:06 PM  I have reviewed the triage vital signs and the nursing notes.   HISTORY  Chief Complaint No chief complaint on file.    HPI Felicia Barnett is a 53 y.o. female who presents emergency department complaining of fevers, nasal congestion, sinus pressure, sinus headache.  Patient reports that she has had symptoms for several days.  She is a diabetic with heart murmur/palpitations and hypertension.  This is limited her over-the-counter medication use.  She reports that over-the-counter medication has not relieved her symptoms.  Patient denies any visual changes, neck pain or stiffness, chest pain, shortness of breath, cough, abdominal pain, nausea or vomiting.    Past Medical History:  Diagnosis Date  . Anxiety    since surgery, pt has had attacks w/ fear of not being able to move her leg (anxiety attacks occur  3-4x month.)  . Diabetes mellitus without complication (HCC)    pre-diabetes  . Heart murmur    palpatiations  . Hyperlipidemia   . Hypertension   . SVT (supraventricular tachycardia) (HCC)     There are no active problems to display for this patient.   Past Surgical History:  Procedure Laterality Date  . ABDOMINAL HYSTERECTOMY    . BREAST BIOPSY Right    neg  . cardial ablation     anxiety attack (heart palpitations HR=210 bpm) the day she was released from the hospital from surgery. Findings were neg .  Marland Kitchen COLONOSCOPY      Prior to Admission medications   Medication Sig Start Date End Date Taking? Authorizing Provider  amoxicillin-clavulanate (AUGMENTIN) 875-125 MG tablet Take 1 tablet by mouth 2 (two) times daily. 10/11/18   Rajendra Spiller, Delorise Royals, PA-C  aspirin EC 81 MG tablet Take 81 mg by mouth 2 (two) times a week.     [provider]  calcium carbonate (TUMS - DOSED IN MG ELEMENTAL CALCIUM) 500 MG  chewable tablet Chew 1-2 tablets by mouth 2 (two) times daily as needed for indigestion or heartburn.     [provider]  chlorthalidone (HYGROTON) 25 MG tablet Take 25 mg by mouth daily.    [provider]  famotidine (PEPCID) 40 MG tablet Take 1 tablet (40 mg total) by mouth every evening. Patient taking differently: Take 40 mg by mouth daily.  09/11/17 09/11/18  Phineas Semen, MD  fluticasone (FLONASE) 50 MCG/ACT nasal spray Place 1 spray into both nostrils 2 (two) times daily. 10/11/18   Janalynn Eder, Delorise Royals, PA-C  gabapentin (NEURONTIN) 300 MG capsule Take 300 mg by mouth daily as needed (for nerve pain).    [provider]  KRILL OIL PO Take 1 capsule by mouth daily.    [provider]  lidocaine (XYLOCAINE) 2 % solution Use as directed 15 mLs in the mouth or throat every 3 (three) hours as needed for mouth pain. 11/16/17   McDonald, Mia A, PA-C  nebivolol (BYSTOLIC) 5 MG tablet Take 5 mg by mouth 2 (two) times daily.     [provider]  potassium chloride 20 MEQ TBCR Take 20 mEq by mouth daily. 06/08/15   Rebecka Apley, MD  valACYclovir (VALTREX) 500 MG tablet Take 500 mg by mouth 2 (two) times daily as needed (for flares).     [provider]    Allergies Patient has no known allergies.  Family History  Problem Relation Age of Onset  . Breast cancer  Neg Hx     Social History Social History   Tobacco Use  . Smoking status: Former Smoker    Years: 20.00    Last attempt to quit: 03/02/2009    Years since quitting: 9.6  . Smokeless tobacco: Never Used  Substance Use Topics  . Alcohol use: Yes    Comment: occ  . Drug use: No     Review of Systems  Constitutional: Positive fever/chills Eyes: No visual changes. No discharge ENT: Positive for nasal congestion, sinus pressure Cardiovascular: no chest pain. Respiratory: no cough. No SOB. Gastrointestinal: No abdominal pain.  No nausea, no vomiting.   Musculoskeletal:  Negative for musculoskeletal pain. Skin: Negative for rash, abrasions, lacerations, ecchymosis. Neurological: Negative for headaches, focal weakness or numbness. 10-point ROS otherwise negative.  ____________________________________________   PHYSICAL EXAM:  VITAL SIGNS: ED Triage Vitals  Enc Vitals Group     BP 10/11/18 1752 (!) 146/72     Pulse Rate 10/11/18 1752 87     Resp 10/11/18 1752 20     Temp 10/11/18 1752 99.9 F (37.7 C)     Temp Source 10/11/18 1752 Oral     SpO2 10/11/18 1752 98 %     Weight 10/11/18 1752 164 lb (74.4 kg)     Height 10/11/18 1752 5' (1.524 m)     Head Circumference --      Peak Flow --      Pain Score 10/11/18 1755 9     Pain Loc --      Pain Edu? --      Excl. in GC? --      Constitutional: Alert and oriented. Well appearing and in no acute distress. Eyes: Conjunctivae are normal. PERRL. EOMI. Head: Atraumatic. ENT:      Ears: EACs and TMs unremarkable bilaterally.      Nose: Significant congestion/rhinnorhea.  Patient is very tender to percussion over the maxillary sinuses.      Mouth/Throat: Mucous membranes are moist.  Neck: No stridor.  Neck is supple full range of motion Hematological/Lymphatic/Immunilogical: Diffuse, mobile, nontender anterior cervical lymphadenopathy. Cardiovascular: Normal rate, regular rhythm. Normal S1 and S2.  Good peripheral circulation. Respiratory: Normal respiratory effort without tachypnea or retractions. Lungs CTAB. Good air entry to the bases with no decreased or absent breath sounds. Musculoskeletal: Full range of motion to all extremities. No gross deformities appreciated. Neurologic:  Normal speech and language. No gross focal neurologic deficits are appreciated.  Skin:  Skin is warm, dry and intact. No rash noted. Psychiatric: Mood and affect are normal. Speech and behavior are normal. Patient exhibits appropriate insight and judgement.   ____________________________________________   LABS (all  labs ordered are listed, but only abnormal results are displayed)  Labs Reviewed  INFLUENZA PANEL BY PCR (TYPE A & B)   ____________________________________________  EKG   ____________________________________________  RADIOLOGY   No results found.  ____________________________________________    PROCEDURES  Procedure(s) performed:    Procedures    Medications - No data to display   ____________________________________________   INITIAL IMPRESSION / ASSESSMENT AND PLAN / ED COURSE  Pertinent labs & imaging results that were available during my care of the patient were reviewed by me and considered in my medical decision making (see chart for details).  Review of the [place] CSRS was performed in accordance of the [facility] prior to dispensing any controlled drugs.      Patient's diagnosis is consistent with bacterial sinusitis.  Patient presents the emergency department with sinus congestion,  sinus pressure, sinus headache, fever.  As there has been a recent illness going to the patient's house, patient was tested for influenza which was negative.  Diagnosis most consistent with bacterial sinusitis.  Patient will be placed on Augmentin, Flonase.  Patient will follow-up with primary care as needed.  Patient is given ED precautions to return to the ED for any worsening or new symptoms.     ____________________________________________  FINAL CLINICAL IMPRESSION(S) / ED DIAGNOSES  Final diagnoses:  Acute non-recurrent maxillary sinusitis      NEW MEDICATIONS STARTED DURING THIS VISIT:  ED Discharge Orders         Ordered    amoxicillin-clavulanate (AUGMENTIN) 875-125 MG tablet  2 times daily     10/11/18 1914    fluticasone (FLONASE) 50 MCG/ACT nasal spray  2 times daily     10/11/18 1914              This chart was dictated using voice recognition software/Dragon. Despite best efforts to proofread, errors can occur which can change the meaning. Any change  was purely unintentional.    Racheal PatchesCuthriell, Aleysia Oltmann D, PA-C 10/11/18 1916    Dionne BucySiadecki, Sebastian, MD 10/11/18 2315

## 2018-10-11 NOTE — ED Notes (Signed)
Pt verbalized understanding of d/c instructions, rx's, and f/u care. No further questions at this time. Pt ambulatory to exit with steady gait.

## 2018-10-16 DIAGNOSIS — H1033 Unspecified acute conjunctivitis, bilateral: Secondary | ICD-10-CM | POA: Diagnosis not present

## 2018-10-16 DIAGNOSIS — J0100 Acute maxillary sinusitis, unspecified: Secondary | ICD-10-CM | POA: Diagnosis not present

## 2018-10-21 ENCOUNTER — Emergency Department: Payer: 59

## 2018-10-21 ENCOUNTER — Other Ambulatory Visit: Payer: Self-pay

## 2018-10-21 DIAGNOSIS — E876 Hypokalemia: Secondary | ICD-10-CM | POA: Diagnosis not present

## 2018-10-21 DIAGNOSIS — I10 Essential (primary) hypertension: Secondary | ICD-10-CM | POA: Insufficient documentation

## 2018-10-21 DIAGNOSIS — Z7982 Long term (current) use of aspirin: Secondary | ICD-10-CM | POA: Insufficient documentation

## 2018-10-21 DIAGNOSIS — R079 Chest pain, unspecified: Secondary | ICD-10-CM | POA: Diagnosis not present

## 2018-10-21 DIAGNOSIS — Z79899 Other long term (current) drug therapy: Secondary | ICD-10-CM | POA: Insufficient documentation

## 2018-10-21 DIAGNOSIS — R002 Palpitations: Secondary | ICD-10-CM | POA: Insufficient documentation

## 2018-10-21 DIAGNOSIS — Z87891 Personal history of nicotine dependence: Secondary | ICD-10-CM | POA: Insufficient documentation

## 2018-10-21 DIAGNOSIS — R12 Heartburn: Secondary | ICD-10-CM | POA: Diagnosis not present

## 2018-10-21 DIAGNOSIS — R51 Headache: Secondary | ICD-10-CM | POA: Insufficient documentation

## 2018-10-21 DIAGNOSIS — E119 Type 2 diabetes mellitus without complications: Secondary | ICD-10-CM | POA: Diagnosis not present

## 2018-10-21 LAB — CBC
HEMATOCRIT: 35.8 % — AB (ref 36.0–46.0)
Hemoglobin: 12.2 g/dL (ref 12.0–15.0)
MCH: 30 pg (ref 26.0–34.0)
MCHC: 34.1 g/dL (ref 30.0–36.0)
MCV: 88.2 fL (ref 80.0–100.0)
NRBC: 0 % (ref 0.0–0.2)
PLATELETS: 341 10*3/uL (ref 150–400)
RBC: 4.06 MIL/uL (ref 3.87–5.11)
RDW: 11.9 % (ref 11.5–15.5)
WBC: 9.6 10*3/uL (ref 4.0–10.5)

## 2018-10-21 NOTE — ED Triage Notes (Signed)
Reports chest pain that started approximately 1 hr prior to arrival.  Also reports dizziness.

## 2018-10-22 ENCOUNTER — Emergency Department
Admission: EM | Admit: 2018-10-22 | Discharge: 2018-10-22 | Disposition: A | Discharge status: Home / Self Care | Payer: 59 | Attending: Emergency Medicine | Admitting: Emergency Medicine

## 2018-10-22 DIAGNOSIS — R002 Palpitations: Secondary | ICD-10-CM

## 2018-10-22 LAB — BASIC METABOLIC PANEL
Anion gap: 10 (ref 5–15)
BUN: 18 mg/dL (ref 6–20)
CALCIUM: 9 mg/dL (ref 8.9–10.3)
CHLORIDE: 101 mmol/L (ref 98–111)
CO2: 27 mmol/L (ref 22–32)
Creatinine, Ser: 0.75 mg/dL (ref 0.44–1.00)
GFR calc non Af Amer: >60 mL/min (ref >60)
Glucose, Bld: 135 mg/dL — ABNORMAL HIGH (ref 70–99)
Potassium: 2.6 mmol/L — CL (ref 3.5–5.1)
SODIUM: 138 mmol/L (ref 135–145)

## 2018-10-22 LAB — MAGNESIUM: MAGNESIUM: 2 mg/dL (ref 1.7–2.4)

## 2018-10-22 LAB — TROPONIN I
Troponin I: <0.03 ng/mL (ref <0.03)
Troponin I: <0.03 ng/mL (ref <0.03)

## 2018-10-22 MED ORDER — POTASSIUM CHLORIDE CRYS ER 20 MEQ PO TBCR
40.0000 meq | EXTENDED_RELEASE_TABLET | Freq: Once | ORAL | Status: AC
  Administered 2018-10-22: 40 meq via ORAL
  Filled 2018-10-22: qty 2

## 2018-10-22 NOTE — ED Provider Notes (Signed)
[facility] Emergency Department Provider Note  Time seen: 8:39 AM  I have reviewed the triage vital signs and the nursing notes.   HISTORY  Chief Complaint Chest Pain    HPI Felicia HausenValentina I Khim is a 53 y.o. female with a past medical history of anxiety, diabetes, palpitations, hypertension, hyperlipidemia presents to the emergency department for heartburn palpitations and headache.  According to the patient last night around 10:00 she was lying down to go to bed when she began experiencing palpitations.  States a history of palpitations in the past but these palpitations were worse.  She also states she was experiencing bad heartburn.  States she will typically get heartburn but it seemed worse last night along with the palpitations this worried the patient so she came to the emergency department for evaluation.  On review of systems also states mild headache last night as well.  Patient has been in the emergency department for a very long time, 9 hours at this point.  Currently she states she feels tired but the other symptoms have since resolved.   Past Medical History:  Diagnosis Date  . Anxiety    since surgery, pt has had attacks w/ fear of not being able to move her leg (anxiety attacks occur  3-4x month.)  . Diabetes mellitus without complication (HCC)    pre-diabetes  . Heart murmur    palpatiations  . Hyperlipidemia   . Hypertension   . SVT (supraventricular tachycardia) (HCC)     There are no active problems to display for this patient.   Past Surgical History:  Procedure Laterality Date  . ABDOMINAL HYSTERECTOMY    . BREAST BIOPSY Right    neg  . cardial ablation     anxiety attack (heart palpitations HR=210 bpm) the day she was released from the hospital from surgery. Findings were neg .  Marland Kitchen. COLONOSCOPY      Prior to Admission medications   Medication Sig Start Date End Date Taking? Authorizing Provider  amoxicillin-clavulanate (AUGMENTIN)  875-125 MG tablet Take 1 tablet by mouth 2 (two) times daily. 10/11/18   Cuthriell, Delorise RoyalsJonathan D, PA-C  aspirin EC 81 MG tablet Take 81 mg by mouth 2 (two) times a week.     [provider]  calcium carbonate (TUMS - DOSED IN MG ELEMENTAL CALCIUM) 500 MG chewable tablet Chew 1-2 tablets by mouth 2 (two) times daily as needed for indigestion or heartburn.     [provider]  chlorthalidone (HYGROTON) 25 MG tablet Take 25 mg by mouth daily.    [provider]  famotidine (PEPCID) 40 MG tablet Take 1 tablet (40 mg total) by mouth every evening. Patient taking differently: Take 40 mg by mouth daily.  09/11/17 09/11/18  Phineas SemenGoodman, Graydon, MD  fluticasone (FLONASE) 50 MCG/ACT nasal spray Place 1 spray into both nostrils 2 (two) times daily. 10/11/18   Cuthriell, Delorise RoyalsJonathan D, PA-C  gabapentin (NEURONTIN) 300 MG capsule Take 300 mg by mouth daily as needed (for nerve pain).    [provider]  KRILL OIL PO Take 1 capsule by mouth daily.    [provider]  lidocaine (XYLOCAINE) 2 % solution Use as directed 15 mLs in the mouth or throat every 3 (three) hours as needed for mouth pain. 11/16/17   McDonald, Mia A, PA-C  nebivolol (BYSTOLIC) 5 MG tablet Take 5 mg by mouth 2 (two) times daily.     [provider]  potassium chloride 20 MEQ TBCR Take  20 mEq by mouth daily. 06/08/15   Rebecka ApleyWebster, Allison P, MD  valACYclovir (VALTREX) 500 MG tablet Take 500 mg by mouth 2 (two) times daily as needed (for flares).     [provider]    No Known Allergies  Family History  Problem Relation Age of Onset  . Breast cancer Neg Hx     Social History Social History   Tobacco Use  . Smoking status: Former Smoker    Years: 20.00    Last attempt to quit: 03/02/2009    Years since quitting: 9.6  . Smokeless tobacco: Never Used  Substance Use Topics  . Alcohol use: Yes    Comment: occ  . Drug use: No    Review of Systems Constitutional: Negative for  fever. Cardiovascular: Negative for chest pain.  Positive for palpitations, since resolved Respiratory: Negative for shortness of breath. Gastrointestinal: Positive for heartburn last night.  Negative for nausea or vomiting Genitourinary: Negative for urinary compaints Musculoskeletal: Negative for musculoskeletal complaints Skin: Negative for skin complaints  Neurological: Mild headache last night All other ROS negative  ____________________________________________   PHYSICAL EXAM:  VITAL SIGNS: ED Triage Vitals  Enc Vitals Group     BP 10/21/18 2320 (!) 161/87     Pulse Rate 10/21/18 2320 99     Resp 10/21/18 2320 16     Temp 10/21/18 2320 98 F (36.7 C)     Temp Source 10/21/18 2320 Oral     SpO2 10/21/18 2320 98 %     Weight 10/21/18 2323 164 lb (74.4 kg)     Height 10/21/18 2323 5' (1.524 m)     Head Circumference --      Peak Flow --      Pain Score --      Pain Loc --      Pain Edu? --      Excl. in GC? --    Constitutional: Alert and oriented. Well appearing and in no distress. Eyes: Normal exam ENT   Head: Normocephalic and atraumatic.   Mouth/Throat: Mucous membranes are moist. Cardiovascular: Normal rate, regular rhythm. No murmur Respiratory: Normal respiratory effort without tachypnea nor retractions. Breath sounds are clear Gastrointestinal: Soft and nontender. No distention.  Musculoskeletal: Nontender with normal range of motion in all extremities.  Neurologic:  Normal speech and language. No gross focal neurologic deficits Skin:  Skin is warm, dry and intact.  Psychiatric: Mood and affect are normal.   ____________________________________________    EKG  EKG viewed and interpreted by myself shows a normal sinus rhythm 87 bpm with a narrow QRS, normal axis, normal intervals, non-concerning ST changes.  ____________________________________________    RADIOLOGY  Chest x-ray is  normal  ____________________________________________   INITIAL IMPRESSION / ASSESSMENT AND PLAN / ED COURSE  Pertinent labs & imaging results that were available during my care of the patient were reviewed by me and considered in my medical decision making (see chart for details).  Patient presents to the emergency department for palpitations since last night along with heartburn sensation and a mild headache.  Differential would include ACS, gastric reflux, palpitations.  Overall the patient appears very well.  Patient's work-up is nonrevealing including a negative troponin.  She is noted to have a low potassium 2.6.  Patient has a history of chronic low potassium takes potassium supplements at home but her baseline appears to be closer to 3.0.  Patient received 40 mEq of potassium overnight.  She has been here a very long  time in the emergency department greater than 9 hours at this time.  Patient's initial lab work although normal was within approximately 1 hour of her symptoms starting.  We will recheck a troponin and I will also check a magnesium level.  If the repeat troponin is within normal limits anticipate likely discharge home on increased potassium supplementation and PCP follow-up.  I discussed this with the patient and she is agreeable to this plan of care and hopeful to be able to go home today and rest.  Patient's magnesium is normal 2.0.  Patient's troponin is normal.  We will discharge home.  We will double the patient's potassium for the next 7 days have her follow-up with her doctor in 2 to 3 days for recheck.  Patient agreeable to plan of care.  Provided my normal chest pain return precautions.  ____________________________________________   FINAL CLINICAL IMPRESSION(S) / ED DIAGNOSES  Palpitations Hypokalemia   Minna Antis, MD 10/22/18 [phone]

## 2018-10-22 NOTE — Discharge Instructions (Signed)
As we discussed please increase your potassium to 20 mEq twice daily for the next 7 days.  Please follow-up with your doctor this week for recheck of your labs and reevaluation.  Return to the emergency department for any chest pain, or any other symptom personally concerning to yourself.

## 2018-10-22 NOTE — ED Notes (Signed)
Patient to Rm 17 via WC, Kailey RN aware.

## 2018-10-31 ENCOUNTER — Ambulatory Visit: Payer: 59 | Attending: Neurology

## 2018-10-31 DIAGNOSIS — I509 Heart failure, unspecified: Secondary | ICD-10-CM | POA: Insufficient documentation

## 2018-10-31 DIAGNOSIS — G4733 Obstructive sleep apnea (adult) (pediatric): Secondary | ICD-10-CM | POA: Diagnosis not present

## 2018-10-31 DIAGNOSIS — G4730 Sleep apnea, unspecified: Secondary | ICD-10-CM | POA: Diagnosis not present

## 2018-11-19 DIAGNOSIS — G5603 Carpal tunnel syndrome, bilateral upper limbs: Secondary | ICD-10-CM | POA: Diagnosis not present

## 2018-11-19 DIAGNOSIS — G514 Facial myokymia: Secondary | ICD-10-CM | POA: Diagnosis not present

## 2018-11-19 DIAGNOSIS — R29898 Other symptoms and signs involving the musculoskeletal system: Secondary | ICD-10-CM | POA: Diagnosis not present

## 2018-12-24 ENCOUNTER — Other Ambulatory Visit: Payer: Self-pay

## 2018-12-24 ENCOUNTER — Encounter: Payer: Self-pay | Admitting: *Deleted

## 2018-12-24 ENCOUNTER — Ambulatory Visit: Payer: 59 | Admitting: Cardiovascular Disease

## 2018-12-24 ENCOUNTER — Encounter: Payer: Self-pay | Admitting: Cardiovascular Disease

## 2018-12-24 VITALS — BP 118/80 | HR 73 | Ht 60.0 in | Wt 170.5 lb

## 2018-12-24 DIAGNOSIS — E876 Hypokalemia: Secondary | ICD-10-CM | POA: Diagnosis not present

## 2018-12-24 DIAGNOSIS — R5383 Other fatigue: Secondary | ICD-10-CM | POA: Insufficient documentation

## 2018-12-24 DIAGNOSIS — I10 Essential (primary) hypertension: Secondary | ICD-10-CM | POA: Diagnosis not present

## 2018-12-24 DIAGNOSIS — R002 Palpitations: Secondary | ICD-10-CM | POA: Diagnosis not present

## 2018-12-24 DIAGNOSIS — R079 Chest pain, unspecified: Secondary | ICD-10-CM | POA: Insufficient documentation

## 2018-12-24 DIAGNOSIS — I493 Ventricular premature depolarization: Secondary | ICD-10-CM

## 2018-12-24 DIAGNOSIS — I479 Paroxysmal tachycardia, unspecified: Secondary | ICD-10-CM | POA: Insufficient documentation

## 2018-12-24 MED ORDER — PROPRANOLOL HCL 20 MG PO TABS: 20.0000 mg | ORAL_TABLET | Freq: Three times a day (TID) | ORAL | 3 refills | Status: DC | PRN

## 2018-12-24 NOTE — Progress Notes (Signed)
Cardiology Office Note  Date:  12/24/2018   ID:  Felicia Barnett, DOB: January 29, 1966, MRN: [id number]  PCP:  Emogene Morgan, MD   Chief Complaint  Patient presents with  . New Patient (Initial Visit)    self referral for chest pressure, palpitations - Patient c/o a shaking feeling in chest and right foot swelling. hx with A. Paraschos. Meds reviewed verbally with patient.     HPI:  Felicia Barnett is a 53 y.o. female with a history of: Hypertension Diabetes Palpitations Hyperlipidemia  Anxiety  Who presents to the office today for palpitations and chest pressure.  INTERVAL HISTORY: The patient reports today for an initial visit.  Several issues to discuss on today's visit, previously seen by [facility] cardiology. With walking she gets tired. When at rest she can feel palpitations and it has become more frequent. On a previous monitor, they found PVCs. Compliant with Bystolic 5 twice daily. She uses a CPAP and her recent study showed Cheyne-Stokes breathing.  She was worried about what this means her neurologist found no neurological issues, but was concerned about cardiac issues. Her most recent stress tests were normal.  She had a CT scan chest done in December 2017 that showed no plaque build-up.  Images pulled up in the office and discussed with her on today's visit  In terms of her risk factors, prior smoking history She quit smoking 6 years ago. She is pre-diabetic.  She has a family history of diabetes from both sides. Her father passed away from a myocardial infarction. Her mother has had open heart surgery and has also had a MI.   Trying to lose weight She is currently fasting until noon per neurologist, but it has caused her to feel really hungry and eat more than usual after breaking her fast.   Reports having high cholesterol, but there is no recent lipid panel. She used to be on medication, but her PCP took her off of it and recommended controlling it via lifestyle changes.    She has presented to the ED on multiple occasions due to low potassium levels. She is on chlorthalidone and is also now taking a potassium pill.   Has been having swelling in her right foot, but left side is fine.   Today's Blood pressure 118/80 HBA1C 5.7 CR 0.75 Glucose 135  EKG personally reviewed by myself on todays visit Shows normal sinus rhythm. 73 bpm. Possible left atrial enlargement. Nonspecific T wave abnormality.   OTHER PAST MEDICAL HISTORY REVIEWED BY ME FOR TODAY'S VISIT:   PMH:   has a past medical history of Anxiety, Diabetes mellitus without complication (HCC), Heart murmur, Hyperlipidemia, Hypertension, and SVT (supraventricular tachycardia) (HCC).  PSH:    Past Surgical History:  Procedure Laterality Date  . ABDOMINAL HYSTERECTOMY    . BREAST BIOPSY Right    neg  . cardial ablation     anxiety attack (heart palpitations HR=210 bpm) the day she was released from the hospital from surgery. Findings were neg .  Marland Kitchen COLONOSCOPY      Current Outpatient Medications  Medication Sig Dispense Refill  . b complex vitamins capsule Take by mouth.    . calcium carbonate (TUMS - DOSED IN MG ELEMENTAL CALCIUM) 500 MG chewable tablet Chew 1-2 tablets by mouth 2 (two) times daily as needed for indigestion or heartburn.     . chlorthalidone (HYGROTON) 25 MG tablet Take 25 mg by mouth daily.    Marland Kitchen gabapentin (NEURONTIN) 300 MG capsule Take 300  mg by mouth daily as needed (for nerve pain).    Marland Kitchen. KRILL OIL PO Take 1 capsule by mouth daily.    . magnesium oxide (MAG-OX) 400 MG tablet Take by mouth.    . nebivolol (BYSTOLIC) 5 MG tablet Take 5 mg by mouth 2 (two) times daily.     . potassium chloride 20 MEQ TBCR Take 20 mEq by mouth daily. 7 tablet 0  . valACYclovir (VALTREX) 500 MG tablet Take 500 mg by mouth 2 (two) times daily as needed (for flares).     . famotidine (PEPCID) 40 MG tablet Take 1 tablet (40 mg total) by mouth every evening. (Patient taking differently: Take 40  mg by mouth daily. ) 30 tablet 1  . propranolol (INDERAL) 20 MG tablet Take 1 tablet (20 mg total) by mouth 3 (three) times daily as needed. For breakthrough palpitations. 90 tablet 3   No current facility-administered medications for this visit.      ALLERGIES:   Patient has no known allergies.   SOCIAL HISTORY:  The patient  reports that she quit smoking about 9 years ago. She quit after 20.00 years of use. She has never used smokeless tobacco. She reports current alcohol use. She reports that she does not use drugs.   FAMILY HISTORY:   family history is not on file.    REVIEW OF SYSTEMS: Review of Systems  Constitutional: Negative.   Eyes: Negative.   Respiratory: Negative.   Cardiovascular: Positive for palpitations and leg swelling (Right lower extremity).  Gastrointestinal: Negative.   Genitourinary: Negative.   Musculoskeletal: Negative.   Neurological: Negative.   Psychiatric/Behavioral: The patient is nervous/anxious.   All other systems reviewed and are negative.    PHYSICAL EXAM: VS:  BP 118/80 (BP Location: Right Arm, Patient Position: Sitting, Cuff Size: Normal)   Pulse 73   Ht 5' (1.524 m)   Wt 170 lb 8 oz (77.3 kg)   BMI 33.30 kg/m  , BMI Body mass index is 33.3 kg/m.  GEN: Well nourished, well developed, in no acute distress HEENT: normal Neck: no JVD, carotid bruits, or masses Cardiac: RRR; no murmurs, rubs, or gallops,no edema  Respiratory:  clear to auscultation bilaterally, normal work of breathing GI: soft, nontender, nondistended, + BS MS: no deformity or atrophy Skin: warm and dry, no rash Neuro:  Strength and sensation are intact Psych: euthymic mood, full affect   RECENT LABS: 10/21/2018: BUN 18; Creatinine, Ser 0.75; Hemoglobin 12.2; Platelets 341; Potassium 2.6; Sodium 138 10/22/2018: Magnesium 2.0    LIPID PANEL: No results found for: CHOL, HDL, LDLCALC, TRIG    WEIGHT: Wt Readings from Last 3 Encounters:  12/24/18 170 lb 8 oz  (77.3 kg)  10/21/18 164 lb (74.4 kg)  10/11/18 164 lb (74.4 kg)       ASSESSMENT AND PLAN:  Paroxysmal tachycardia (HCC) Long discussion concerning her history of arrhythmia Recommend she stay on her bystolic Prescription given for propranolol 20 mg for her to take as needed for breakthrough tachycardia  Chest pain, unspecified type Long history of chest pain, previous work-up including stress testing x2- CT scan chest pulled up in the office, images reviewed showing no coronary calcification, no aortic atherosclerosis  Palpitations - Plan: EKG 12-Lead, Basic metabolic panel Prior history of short runs of tachycardia and PVCs We will continue beta-blockers as above  Essential hypertension Blood pressure is well controlled on today's visit. No changes made to the medications.  Low potassium syndrome Secondary to chlorthalidone Now  on a potassium supplement  PVC's (premature ventricular contractions) As above stay on the bystolic Propranolol as needed  Other fatigue Recommended regular walking program Unclear if some of her symptoms is secondary to stress  Hyperlipidemia Recommended weight loss, exercise program  Disposition:   F/U  12 months  Total encounter time more than 45 minutes. Greater than 50% was spent in counseling and coordination of care with the patient.    Orders Placed This Encounter  Procedures  . Basic metabolic panel  . EKG 12-Lead     I, Jesus Reyes am acting as a scribe for Julien Nordmann, M.D., Ph.D.  I, Julien Nordmann, M.D. Ph.D., have reviewed the above documentation for accuracy and completeness, and I agree with the above.   Signed, Dossie Arbour, M.D., Ph.D. 12/24/2018  [facility] [place], [place] [phone]

## 2018-12-24 NOTE — Patient Instructions (Addendum)
Medication Instructions:  Please take propranolol for breakthrough palpitations  If you need a refill on your cardiac medications before your next appointment, please call your pharmacy.    Lab work: [employer] today  If you have labs (blood work) drawn today and your tests are completely normal, you will receive your results only by: Marland Kitchen MyChart Message (if you have MyChart) OR . A paper copy in the mail If you have any lab test that is abnormal or we need to change your treatment, we will call you to review the results.   Testing/Procedures: No new testing needed   Follow-Up: At [facility], you and your health needs are our priority.  As part of our continuing mission to provide you with exceptional heart care, we have created designated Provider Care Teams.  These Care Teams include your primary Cardiologist (physician) and Advanced Practice Providers (APPs -  Physician Assistants and Nurse Practitioners) who all work together to provide you with the care you need, when you need it.  . You will need a follow up appointment as needed .   Please call our office 2 months in advance to schedule this appointment.    . Providers on your designated Care Team:   . Nicolasa Ducking, NP . Eula Listen, PA-C . Marisue Ivan, PA-C  Any Other Special Instructions Will Be Listed Below (If Applicable).  For educational health videos Log in to : [email or web address] Or : [email or web address], password : [email or web address]

## 2018-12-25 ENCOUNTER — Emergency Department (HOSPITAL_COMMUNITY)
Admission: EM | Admit: 2018-12-25 | Discharge: 2018-12-25 | Disposition: A | Discharge status: Home / Self Care | Payer: 59 | Attending: Emergency Medicine | Admitting: Emergency Medicine

## 2018-12-25 ENCOUNTER — Other Ambulatory Visit: Payer: Self-pay

## 2018-12-25 DIAGNOSIS — R002 Palpitations: Secondary | ICD-10-CM | POA: Diagnosis not present

## 2018-12-25 DIAGNOSIS — Z79899 Other long term (current) drug therapy: Secondary | ICD-10-CM | POA: Insufficient documentation

## 2018-12-25 DIAGNOSIS — Z87891 Personal history of nicotine dependence: Secondary | ICD-10-CM | POA: Diagnosis not present

## 2018-12-25 DIAGNOSIS — I471 Supraventricular tachycardia: Secondary | ICD-10-CM | POA: Diagnosis not present

## 2018-12-25 DIAGNOSIS — E876 Hypokalemia: Secondary | ICD-10-CM | POA: Insufficient documentation

## 2018-12-25 DIAGNOSIS — I10 Essential (primary) hypertension: Secondary | ICD-10-CM | POA: Diagnosis not present

## 2018-12-25 DIAGNOSIS — R079 Chest pain, unspecified: Secondary | ICD-10-CM | POA: Diagnosis not present

## 2018-12-25 DIAGNOSIS — R000 Tachycardia, unspecified: Secondary | ICD-10-CM | POA: Diagnosis not present

## 2018-12-25 LAB — BASIC METABOLIC PANEL
Anion gap: 11 (ref 5–15)
BUN / CREAT RATIO: 13 (ref 9–23)
BUN: 9 mg/dL (ref 6–24)
BUN: 11 mg/dL (ref 6–20)
CHLORIDE: 101 mmol/L (ref 98–111)
CO2: 25 mmol/L (ref 20–29)
CO2: 24 mmol/L (ref 22–32)
CREATININE: 0.7 mg/dL (ref 0.57–1.00)
CREATININE: 0.81 mg/dL (ref 0.44–1.00)
Calcium: 9.3 mg/dL (ref 8.7–10.2)
Calcium: 9.3 mg/dL (ref 8.9–10.3)
Chloride: 100 mmol/L (ref 96–106)
GFR calc Af Amer: >60 mL/min (ref >60)
GFR calc non Af Amer: 99 mL/min/{1.73_m2} (ref >59)
GFR calc non Af Amer: >60 mL/min (ref >60)
GFR, EST AFRICAN AMERICAN: 114 mL/min/{1.73_m2} (ref >59)
GLUCOSE: 100 mg/dL — AB (ref 65–99)
GLUCOSE: 119 mg/dL — AB (ref 70–99)
POTASSIUM: 3.1 mmol/L — AB (ref 3.5–5.1)
Potassium: 3.5 mmol/L (ref 3.5–5.2)
SODIUM: 141 mmol/L (ref 134–144)
Sodium: 136 mmol/L (ref 135–145)

## 2018-12-25 LAB — CBC WITH DIFFERENTIAL/PLATELET
Abs Immature Granulocytes: 0.02 10*3/uL (ref 0.00–0.07)
Basophils Absolute: 0 10*3/uL (ref 0.0–0.1)
Basophils Relative: 0 %
EOS PCT: 2 %
Eosinophils Absolute: 0.2 10*3/uL (ref 0.0–0.5)
HEMATOCRIT: 37.8 % (ref 36.0–46.0)
HEMOGLOBIN: 12.7 g/dL (ref 12.0–15.0)
Immature Granulocytes: 0 %
LYMPHS ABS: 2.6 10*3/uL (ref 0.7–4.0)
LYMPHS PCT: 39 %
MCH: 30.1 pg (ref 26.0–34.0)
MCHC: 33.6 g/dL (ref 30.0–36.0)
MCV: 89.6 fL (ref 80.0–100.0)
Monocytes Absolute: 0.6 10*3/uL (ref 0.1–1.0)
Monocytes Relative: 9 %
Neutro Abs: 3.3 10*3/uL (ref 1.7–7.7)
Neutrophils Relative %: 50 %
Platelets: 264 10*3/uL (ref 150–400)
RBC: 4.22 MIL/uL (ref 3.87–5.11)
RDW: 12.7 % (ref 11.5–15.5)
WBC: 6.8 10*3/uL (ref 4.0–10.5)
nRBC: 0 % (ref 0.0–0.2)

## 2018-12-25 LAB — MAGNESIUM: Magnesium: 1.6 mg/dL — ABNORMAL LOW (ref 1.7–2.4)

## 2018-12-25 MED ORDER — POTASSIUM CHLORIDE CRYS ER 20 MEQ PO TBCR
40.0000 meq | EXTENDED_RELEASE_TABLET | Freq: Once | ORAL | Status: AC
  Administered 2018-12-25: 40 meq via ORAL
  Filled 2018-12-25: qty 2

## 2018-12-25 MED ORDER — MAGNESIUM OXIDE 400 (241.3 MG) MG PO TABS
400.0000 mg | ORAL_TABLET | Freq: Once | ORAL | Status: AC
  Administered 2018-12-25: 400 mg via ORAL
  Filled 2018-12-25: qty 1

## 2018-12-25 MED ORDER — SODIUM CHLORIDE 0.9 % IV BOLUS
500.0000 mL | Freq: Once | INTRAVENOUS | Status: AC
  Administered 2018-12-25: 500 mL via INTRAVENOUS

## 2018-12-25 NOTE — Discharge Instructions (Addendum)
It was my pleasure taking care of you today!   Follow up with either your primary care doctor or cardiologist for further discussion of today's hospital visit.   Return to ER for new or worsening symptoms, any additional concerns.

## 2018-12-25 NOTE — ED Notes (Signed)
Patient verbalizes understanding of discharge instructions. Opportunity for questioning and answers were provided. Armband removed by staff, pt discharged from ED ambulatory to home.  

## 2018-12-25 NOTE — ED Triage Notes (Signed)
Pt brought in by ems for c/o palpitations and left sided non radiating chest pain that began while she had a verbal  Altercation with other co workers ; pt states she also began to feel palpitations and anxiety during this time ; pt has a hx of SVT with ablation back in 2015; pt states they checked her HR at her work and was around 140's ; upon ems arrival HR had came down to 118 ; pt received 324 of asa by ems

## 2018-12-25 NOTE — ED Provider Notes (Signed)
[facility] EMERGENCY DEPARTMENT Provider Note   CSN: [id number] Arrival date & time: 12/25/18  1650    History   Chief Complaint Chief Complaint  Patient presents with  . Chest Pain    HPI Felicia Barnett is a 53 y.o. female.     The history is provided by the patient and medical records. No language interpreter was used.  Chest Pain  Associated symptoms: palpitations    Felicia Barnett is a 53 y.o. female  with a PMH of SVT s/p ablation, paroxysmal tachycardia who presents to the Emergency Department by EMS complaining of palpitations which began just prior to arrival.  Patient states that she was in a verbal altercation with 1 of her coworkers.  She felt very discriminated against and became upset.  She states that she could feel her blood pressure rising and her heart rate rising as well.  She was having palpitations which felt similar to her palpitations and feet past with SVT.  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  She states that her heart rate was in the 140s.  Upon EMS arrival, heart rate was 118.  She did receive 324 of aspirin by EMS.  No other medications were taken prior to arrival for symptoms.  Currently, she feels much improved.  She feels as if her mentation self resolved now that she is off of the situation had her job.  She reports he great deal of anxiety around her workplace environment and believes this triggered the event today.  She currently is not having any chest pain or shortness of breath.   Past Medical History:  Diagnosis Date  . Anxiety    since surgery, pt has had attacks w/ fear of not being able to move her leg (anxiety attacks occur  3-4x month.)  . Diabetes mellitus without complication (HCC)    pre-diabetes  . Heart murmur    palpatiations  . Hyperlipidemia   . Hypertension   . SVT (supraventricular tachycardia) [facility])     Patient Active Problem List   Diagnosis Date Noted  .  Paroxysmal tachycardia (HCC) 12/24/2018  . Chest pain 12/24/2018  . Palpitations 12/24/2018  . Essential hypertension 12/24/2018  . Low potassium syndrome 12/24/2018  . PVC's (premature ventricular contractions) 12/24/2018  . Other fatigue 12/24/2018    Past Surgical History:  Procedure Laterality Date  . ABDOMINAL HYSTERECTOMY    . BREAST BIOPSY Right    neg  . cardial ablation     anxiety attack (heart palpitations HR=210 bpm) the day she was released from the hospital from surgery. Findings were neg .  Marland Kitchen COLONOSCOPY       OB History   No obstetric history on file.      Home Medications    Prior to Admission medications   Medication Sig Start Date End Date Taking? Authorizing Provider  Ascorbic Acid (VITAMIN C) 100 MG tablet Take 100 mg by mouth daily.   Yes [provider]  b complex vitamins capsule Take by mouth.   Yes [provider]  calcium carbonate (TUMS - DOSED IN MG ELEMENTAL CALCIUM) 500 MG chewable tablet Chew 1-2 tablets by mouth 2 (two) times daily as needed for indigestion or heartburn.    Yes [provider]  chlorthalidone (HYGROTON) 25 MG tablet Take 25 mg by mouth daily.   Yes [provider]  cholecalciferol (VITAMIN D) 25 MCG (1000 UT) tablet Take  1,000 Units by mouth daily.   Yes [provider]  famotidine (PEPCID) 40 MG tablet Take 1 tablet (40 mg total) by mouth every evening. Patient taking differently: Take 40 mg by mouth daily.  09/11/17 12/25/18 Yes Phineas Semen, MD  gabapentin (NEURONTIN) 300 MG capsule Take 300 mg by mouth daily as needed (for nerve pain).   Yes [provider]  magnesium oxide (MAG-OX) 400 MG tablet Take 400 mg by mouth daily.    Yes [provider]  nebivolol (BYSTOLIC) 5 MG tablet Take 5 mg by mouth 2 (two) times daily.    Yes [provider]  potassium chloride 20 MEQ TBCR Take 20 mEq by mouth daily. 06/08/15  Yes Rebecka Apley, MD  valACYclovir  (VALTREX) 500 MG tablet Take 500 mg by mouth 2 (two) times daily as needed (for flares).    Yes [provider]  propranolol (INDERAL) 20 MG tablet Take 1 tablet (20 mg total) by mouth 3 (three) times daily as needed. For breakthrough palpitations. 12/24/18   Antonieta Iba, MD    Family History Family History  Problem Relation Age of Onset  . Breast cancer Neg Hx     Social History Social History   Tobacco Use  . Smoking status: Former Smoker    Years: 20.00    Last attempt to quit: 03/02/2009    Years since quitting: 9.8  . Smokeless tobacco: Never Used  Substance Use Topics  . Alcohol use: Yes    Comment: occ  . Drug use: No     Allergies   Patient has no known allergies.   Review of Systems Review of Systems  Cardiovascular: Positive for chest pain and palpitations. Negative for leg swelling.  All other systems reviewed and are negative.    Physical Exam Updated Vital Signs BP (!) 151/91   Pulse 89   Temp 98.1 F (36.7 C) (Oral)   Resp 15   Ht 5' (1.524 m)   Wt 77.1 kg   SpO2 98%   BMI 33.20 kg/m   Physical Exam Vitals signs and nursing note reviewed.  Constitutional:      General: She is not in acute distress.    Appearance: She is well-developed.  HENT:     Head: Normocephalic and atraumatic.  Neck:     Musculoskeletal: Neck supple.  Cardiovascular:     Heart sounds: Normal heart sounds. No murmur.     Comments: Regular rate and rhythm. Pulmonary:     Effort: Pulmonary effort is normal. No respiratory distress.     Breath sounds: Normal breath sounds.     Comments: Lungs clear to auscultation bilaterally. Abdominal:     General: There is no distension.     Palpations: Abdomen is soft.     Tenderness: There is no abdominal tenderness.  Skin:    General: Skin is warm and dry.  Neurological:     Mental Status: She is alert and oriented to person, place, and time.      ED Treatments / Results  Labs (all labs ordered are  listed, but only abnormal results are displayed) Labs Reviewed  BASIC METABOLIC PANEL - Abnormal; Notable for the following components:      Result Value   Potassium 3.1 (*)    Glucose, Bld 119 (*)    All other components within normal limits  MAGNESIUM - Abnormal; Notable for the following components:   Magnesium 1.6 (*)    All other components within normal  limits  CBC WITH DIFFERENTIAL/PLATELET    EKG EKG Interpretation  Date/Time:  Tuesday December 25 2018 16:55:14 EDT Ventricular Rate:  97 PR Interval:    QRS Duration: 89 QT Interval:  342 QTC Calculation: 435 R Axis:   -13 Text Interpretation:  Sinus rhythm Probable left atrial enlargement RSR' in V1 or V2, probably normal variant Borderline T abnormalities, diffuse leads No significant change since last tracing Confirmed by Melene Plan [phone]) on 12/25/2018 6:14:55 PM   Radiology No results found.  Procedures Procedures (including critical care time)  Medications Ordered in ED Medications  magnesium oxide (MAG-OX) tablet 400 mg (has no administration in time range)  potassium chloride SA (K-DUR,KLOR-CON) CR tablet 40 mEq (has no administration in time range)  sodium chloride 0.9 % bolus 500 mL (500 mLs Intravenous New Bag/Given 12/25/18 1721)     Initial Impression / Assessment and Plan / ED Course  I have reviewed the triage vital signs and the nursing notes.  Pertinent labs & imaging results that were available during my care of the patient were reviewed by me and considered in my medical decision making (see chart for details).       SOFIAGRACE PELICAN is a 53 y.o. female who presents to ED for palpitations which began during a verbal or occasion with her coworker today at work.  Heart rate in the 140s per nurse at her job.  When 18 upon EMS arrival.  On arrival to the emergency department, she had regular heart rate and rhythm.  Rate of 90s on the cardiac monitor during my evaluation.  She reports that she feels  much better and believes that her palpitations were triggered by the altercation that she had with her coworker today.  EKG today with no significant change from previous.  Her labs were reviewed showing hypokalemia and hypomagnesemia which were replenished in ED.  Does appear patient has history of the same.  Recommended that she follow-up with either her PCP or cardiologist for recheck of labs next week.  On repeat evaluation, she feels much improved.  Her symptoms have totally resolved.  She understands follow-up care plan and reasons to return to the emergency department.  She was discharged home in satisfactory condition.  Patient discussed with Dr. Adela Lank who agrees with treatment plan.   Final Clinical Impressions(s) / ED Diagnoses   Final diagnoses:  Palpitations  Hypokalemia  Hypomagnesemia    ED Discharge Orders    None       Ward, Chase Picket, PA-C 12/25/18 1844    Melene Plan, DO 12/25/18 1907

## 2019-01-01 ENCOUNTER — Telehealth: Payer: Self-pay

## 2019-01-01 NOTE — Telephone Encounter (Signed)
Call attempted, no answer, LMTCB

## 2019-01-01 NOTE — Telephone Encounter (Signed)
-----   Message from Timothy J Gollan, MD sent at 12/30/2018  8:56 PM EDT ----- potassium 20 meq daily  except 2 days a week 40 meq daily 

## 2019-01-02 MED ORDER — POTASSIUM CHLORIDE ER 20 MEQ PO TBCR: 20.0000 meq | EXTENDED_RELEASE_TABLET | ORAL | 2 refills | Status: DC

## 2019-01-02 NOTE — Telephone Encounter (Signed)
Spoke with patient and reviewed results and provider recommendations. She reports that when she was at work she had a stressful situation which caused her blood pressure to be elevated and heart rate was in the 140's. She states they called EMS and transported her to [facility] ED. Reviewed her symptoms and she reports right sided chest discomfort that she felt was a muscle strain. Instructed her to avoid any caffeine, energy drinks, and to also monitor heart rates. Reviewed that she has propranolol which she can take three times daily as well. Discussed signs and symptoms that would be concerning for cardiac issues with instructions to call or go to ED for any persistent symptoms or if they worsen. Reviewed potassium instructions in detail and she requested refill at this time. Will route to provider for his review.

## 2019-01-02 NOTE — Telephone Encounter (Signed)
-----   Message from Antonieta Iba, MD sent at 12/30/2018  8:56 PM EDT ----- potassium 20 meq daily  except 2 days a week 40 meq daily

## 2019-01-14 DIAGNOSIS — G4733 Obstructive sleep apnea (adult) (pediatric): Secondary | ICD-10-CM | POA: Diagnosis not present

## 2019-01-22 ENCOUNTER — Other Ambulatory Visit: Payer: Self-pay | Admitting: Cardiovascular Disease

## 2019-01-23 NOTE — Telephone Encounter (Signed)
Called and spoke with Felicia Barnett at [facility] regarding prescription error message. Reviewed prescribing instructions in detail and she reports patient is not due for refill at this time. She updated prescription with no further questions at this time.

## 2019-04-11 IMAGING — US US ABDOMEN COMPLETE
1 series · 14 of 25 positions shown · non-contrast
Comparison: None

CLINICAL DATA: Abdominal pain, LEFT lower quadrant pain for 1
month, elevated transaminases, history hypertension, diabetes
mellitus, former smoker

EXAM:
ABDOMEN ULTRASOUND COMPLETE

[Series 2: us abdomen complete · 0.26mm/px · 14 of 135 slices shown]
[im 1/135]
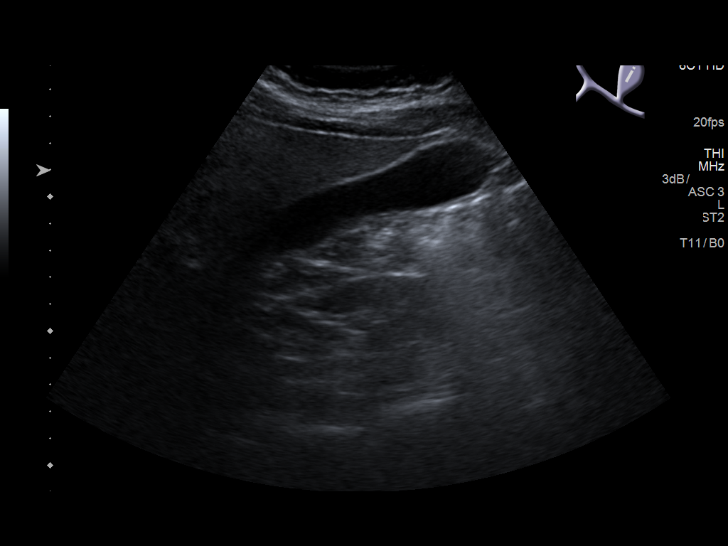
[im 12/135]
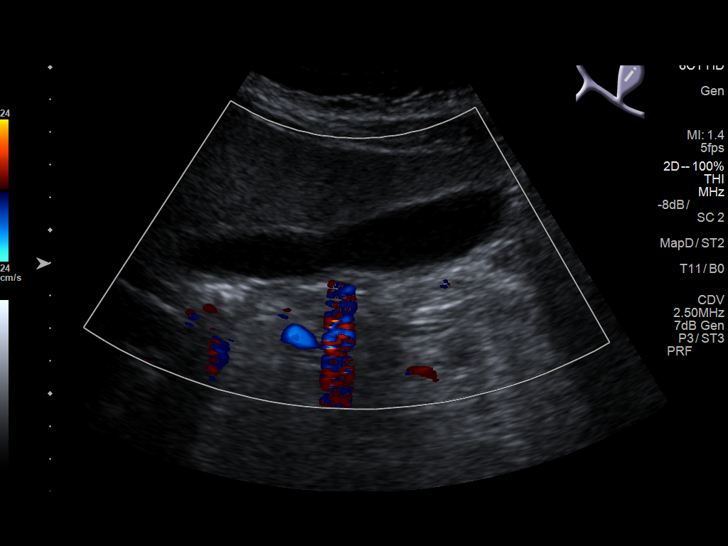
[im 23/135]
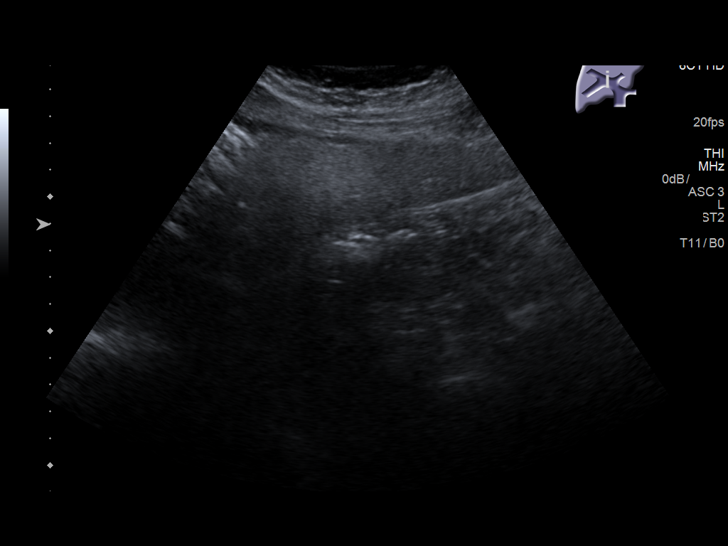
[im 34/135]
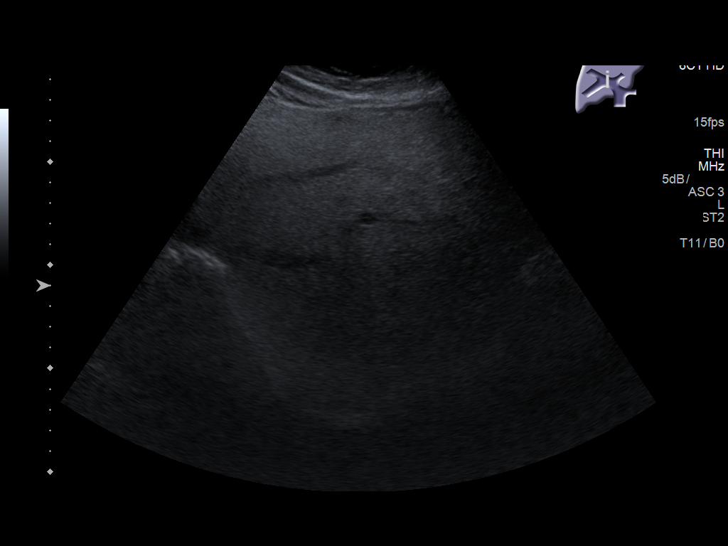
[im 45/135]
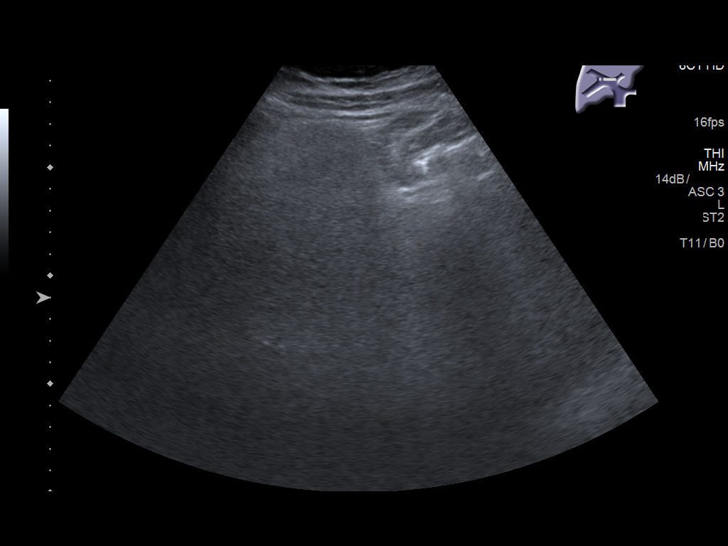
[im 51/135]
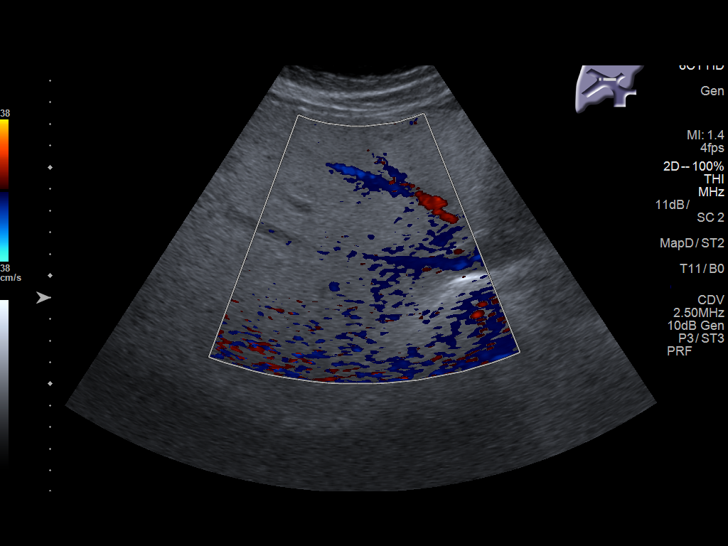
[im 62/135]
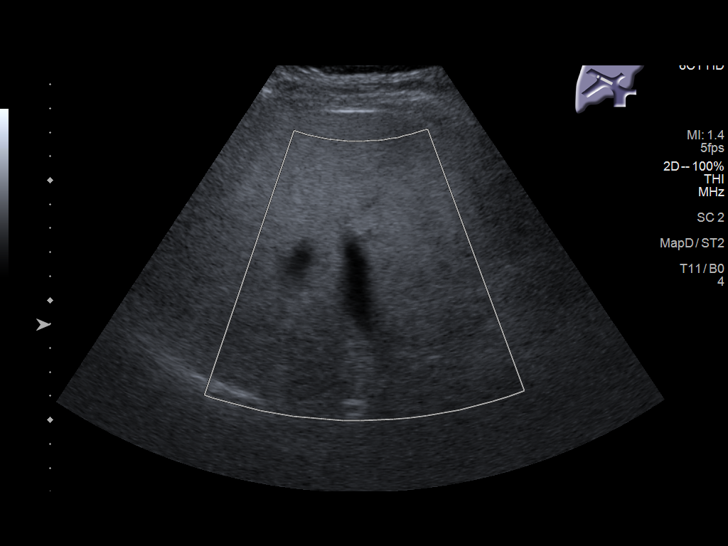
[im 73/135]
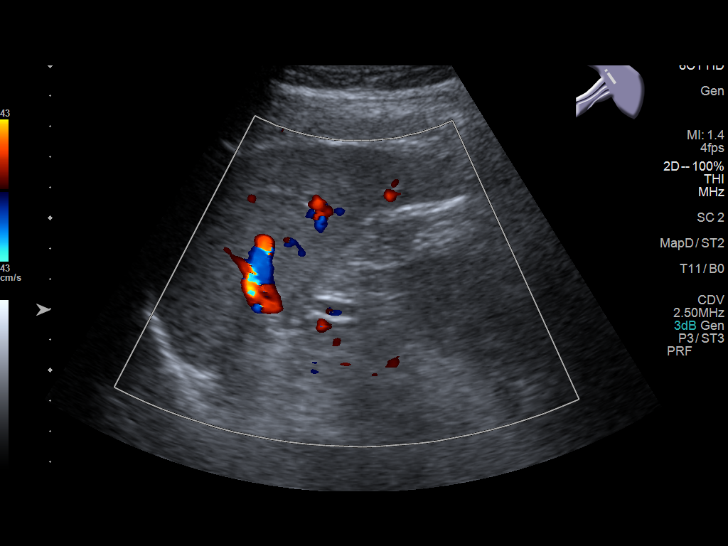
[im 84/135]
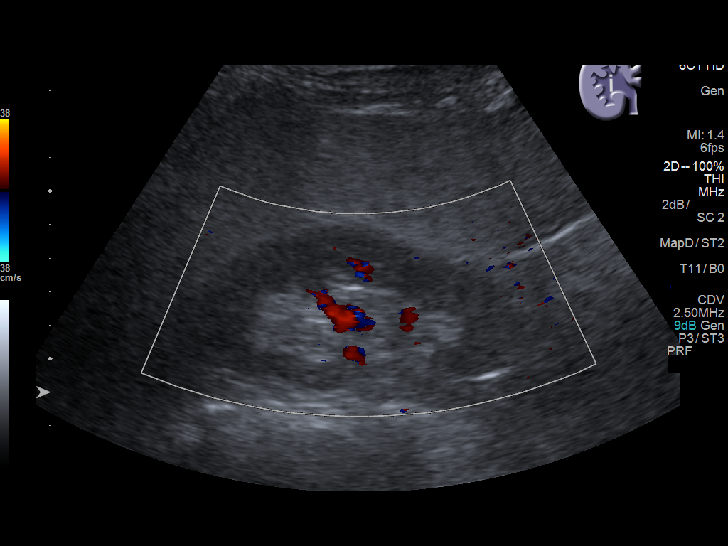
[im 90/135]
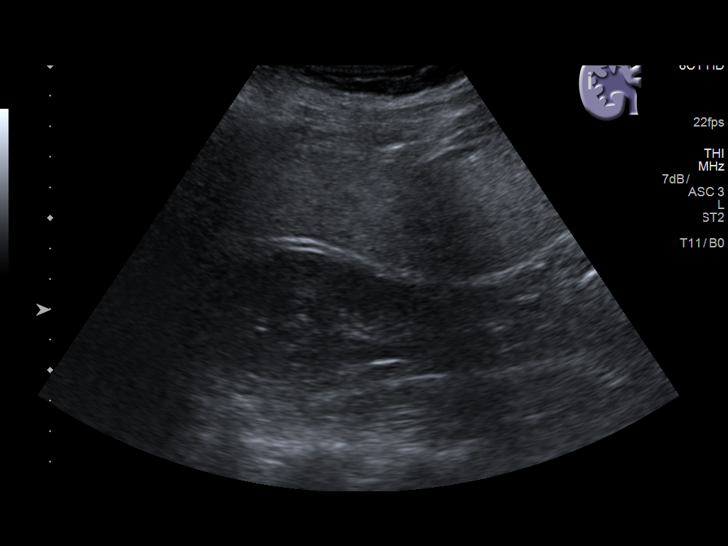
[im 101/135]
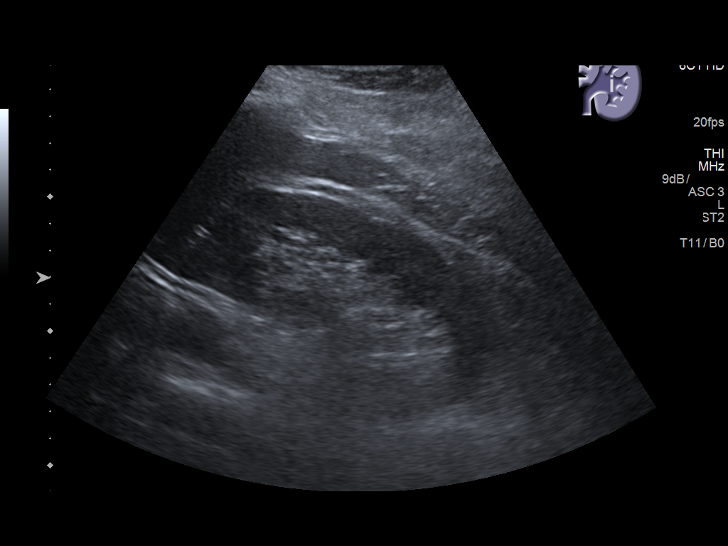
[im 112/135]
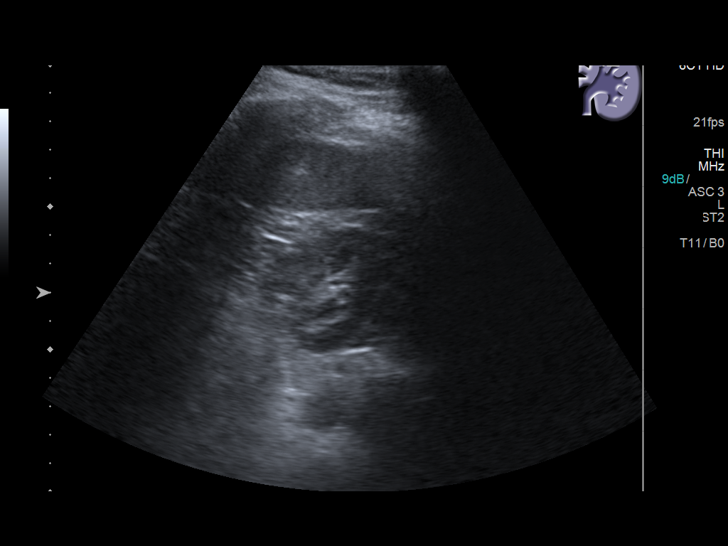
[im 123/135]
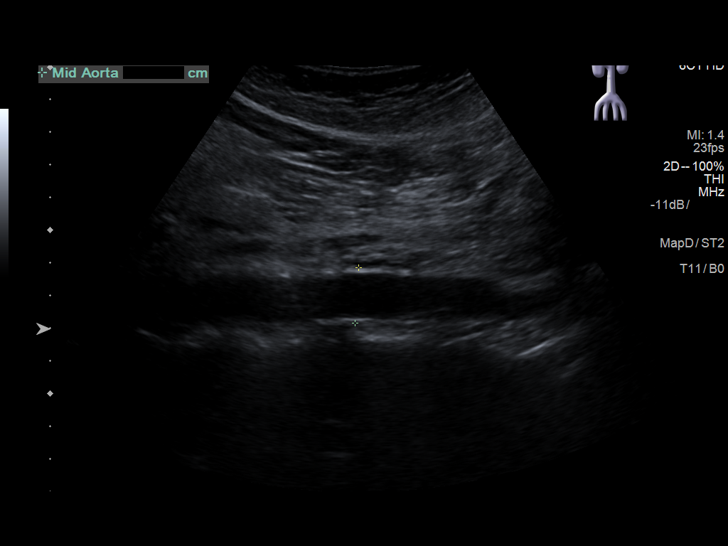
[im 135/135]
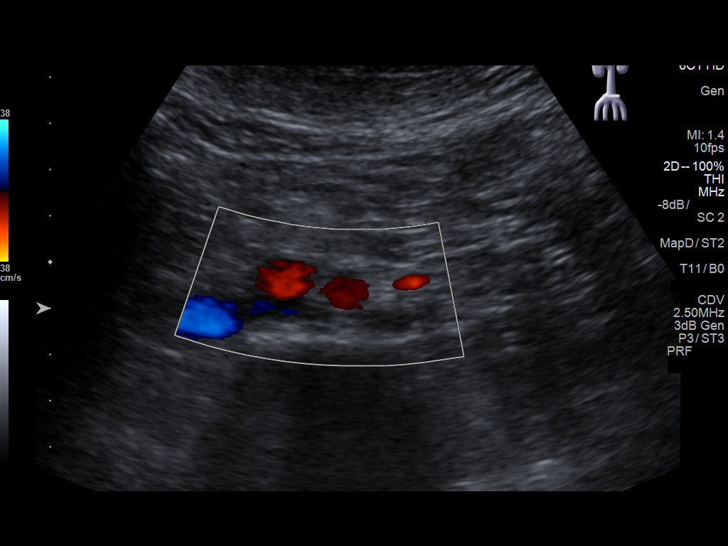

[14 of 25 positions shown; findings below may reference images not displayed]

FINDINGS: Gallbladder: Normally distended without stones or wall thickening.
No pericholecystic fluid or sonographic Murphy sign.

Common bile duct: Diameter: Normal caliber 5 mm diameter

Liver: Echogenic parenchyma, likely fatty infiltration though this
can be seen with cirrhosis and certain infiltrative disorders. No
focal hepatic mass or nodularity. Suboptimal assessment of
intrahepatic detail due to sound attenuation. Portal vein is patent
on color Doppler imaging with normal direction of blood flow towards
the liver.

IVC: Normal appearance

Pancreas: Normal appearance

Spleen: Normal appearance, 10.6 cm length

Right Kidney: Length: 10.0 cm. Normal morphology without mass or
hydronephrosis.

Left Kidney: Length: 11.3 cm. Normal morphology without mass or
hydronephrosis.

Abdominal aorta: Normal caliber

Other findings: No free fluid
IMPRESSION: Probable fatty infiltration of liver as above.

Otherwise negative exam.

## 2019-06-04 ENCOUNTER — Other Ambulatory Visit: Payer: Self-pay | Admitting: Family Medicine

## 2019-06-04 DIAGNOSIS — Z1231 Encounter for screening mammogram for malignant neoplasm of breast: Secondary | ICD-10-CM

## 2019-07-05 ENCOUNTER — Other Ambulatory Visit: Payer: Self-pay

## 2019-07-05 ENCOUNTER — Emergency Department: Payer: 59

## 2019-07-05 ENCOUNTER — Emergency Department
Admission: EM | Admit: 2019-07-05 | Discharge: 2019-07-05 | Disposition: A | Discharge status: Home / Self Care | Payer: 59 | Attending: Emergency Medicine | Admitting: Emergency Medicine

## 2019-07-05 DIAGNOSIS — Z87891 Personal history of nicotine dependence: Secondary | ICD-10-CM | POA: Insufficient documentation

## 2019-07-05 DIAGNOSIS — R0789 Other chest pain: Secondary | ICD-10-CM | POA: Insufficient documentation

## 2019-07-05 DIAGNOSIS — R079 Chest pain, unspecified: Secondary | ICD-10-CM | POA: Diagnosis present

## 2019-07-05 DIAGNOSIS — E119 Type 2 diabetes mellitus without complications: Secondary | ICD-10-CM | POA: Insufficient documentation

## 2019-07-05 DIAGNOSIS — I10 Essential (primary) hypertension: Secondary | ICD-10-CM | POA: Diagnosis not present

## 2019-07-05 DIAGNOSIS — G44209 Tension-type headache, unspecified, not intractable: Secondary | ICD-10-CM | POA: Insufficient documentation

## 2019-07-05 DIAGNOSIS — Z79899 Other long term (current) drug therapy: Secondary | ICD-10-CM | POA: Insufficient documentation

## 2019-07-05 LAB — BASIC METABOLIC PANEL
Anion gap: 10 (ref 5–15)
BUN: 16 mg/dL (ref 6–20)
CO2: 29 mmol/L (ref 22–32)
Calcium: 9.8 mg/dL (ref 8.9–10.3)
Chloride: 101 mmol/L (ref 98–111)
Creatinine, Ser: 0.76 mg/dL (ref 0.44–1.00)
GFR calc Af Amer: >60 mL/min (ref >60)
GFR calc non Af Amer: >60 mL/min (ref >60)
Glucose, Bld: 102 mg/dL — ABNORMAL HIGH (ref 70–99)
Potassium: 3.5 mmol/L (ref 3.5–5.1)
Sodium: 140 mmol/L (ref 135–145)

## 2019-07-05 LAB — CBC
HCT: 37.9 % (ref 36.0–46.0)
Hemoglobin: 13 g/dL (ref 12.0–15.0)
MCH: 30 pg (ref 26.0–34.0)
MCHC: 34.3 g/dL (ref 30.0–36.0)
MCV: 87.3 fL (ref 80.0–100.0)
Platelets: 247 10*3/uL (ref 150–400)
RBC: 4.34 MIL/uL (ref 3.87–5.11)
RDW: 12.2 % (ref 11.5–15.5)
WBC: 6.7 10*3/uL (ref 4.0–10.5)
nRBC: 0 % (ref 0.0–0.2)

## 2019-07-05 LAB — POCT PREGNANCY, URINE: Preg Test, Ur: NEGATIVE

## 2019-07-05 LAB — TROPONIN I (HIGH SENSITIVITY)
Troponin I (High Sensitivity): 5 ng/L (ref <18)
Troponin I (High Sensitivity): 5 ng/L (ref <18)

## 2019-07-05 MED ORDER — HYDROXYZINE HCL 25 MG PO TABS: 25.0000 mg | ORAL_TABLET | Freq: Three times a day (TID) | ORAL | 0 refills | Status: DC | PRN

## 2019-07-05 MED ORDER — IBUPROFEN 600 MG PO TABS: 600.0000 mg | ORAL_TABLET | Freq: Three times a day (TID) | ORAL | 0 refills | Status: DC | PRN

## 2019-07-05 MED ORDER — SODIUM CHLORIDE 0.9 % IV BOLUS
1000.0000 mL | Freq: Once | INTRAVENOUS | Status: AC
  Administered 2019-07-05: 1000 mL via INTRAVENOUS

## 2019-07-05 MED ORDER — DIPHENHYDRAMINE HCL 50 MG/ML IJ SOLN
25.0000 mg | Freq: Once | INTRAMUSCULAR | Status: AC
  Administered 2019-07-05: 25 mg via INTRAVENOUS
  Filled 2019-07-05: qty 1

## 2019-07-05 MED ORDER — PROCHLORPERAZINE EDISYLATE 10 MG/2ML IJ SOLN
10.0000 mg | Freq: Once | INTRAMUSCULAR | Status: AC
  Administered 2019-07-05: 10 mg via INTRAVENOUS
  Filled 2019-07-05: qty 2

## 2019-07-05 NOTE — ED Provider Notes (Signed)
[facility] Emergency Department Provider Note  ____________________________________________   First MD Initiated Contact with Patient 07/05/19 1946     (approximate)  I have reviewed the triage vital signs and the nursing notes.   HISTORY  Chief Complaint Chest Pain, Arm Pain, and Headache    HPI Felicia Barnett is a 53 y.o. female  With PMHx HTN, HLD, SVT, DM, here with multiple complaints.   Pt states her primary issue is a constant, daily, aching, dull headache in her forehead that wraps around to her back of her head. She has associated aching behind her eyes. This is associated occasionally with a mild substernal chest pressure when it is "really bad," along with a burning type sensation in her left arm. She admits this correlates w/ increased stressors @ work. 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 No fever. No fever, chills. HA began gradually but is new for her. No focal numbness, weakness, vision changes, slurred speech.  Pt also c/o intermittent palpitations. These are also an ongoing issue for which she has seen Cardiology. No CP with palpitations. No leg swelling.       Past Medical History:  Diagnosis Date   Anxiety    since surgery, pt has had attacks w/ fear of not being able to move her leg (anxiety attacks occur  3-4x month.)   Diabetes mellitus without complication (HCC)    pre-diabetes   Heart murmur    palpatiations   Hyperlipidemia    Hypertension    SVT (supraventricular tachycardia) [facility](HCC)     Patient Active Problem List   Diagnosis Date Noted   Paroxysmal tachycardia (HCC) 12/24/2018   Chest pain 12/24/2018   Palpitations 12/24/2018   Essential hypertension 12/24/2018   Low potassium syndrome 12/24/2018   PVC's (premature ventricular contractions) 12/24/2018   Other fatigue 12/24/2018    Past Surgical History:  Procedure Laterality Date   ABDOMINAL HYSTERECTOMY     BREAST BIOPSY Right    neg   cardial  ablation     anxiety attack (heart palpitations HR=210 bpm) the day she was released from the hospital from surgery. Findings were neg .   COLONOSCOPY      Prior to Admission medications   Medication Sig Start Date End Date Taking? Authorizing Provider  Ascorbic Acid (VITAMIN C) 100 MG tablet Take 100 mg by mouth daily.    [provider]  b complex vitamins capsule Take by mouth.    [provider]  calcium carbonate (TUMS - DOSED IN MG ELEMENTAL CALCIUM) 500 MG chewable tablet Chew 1-2 tablets by mouth 2 (two) times daily as needed for indigestion or heartburn.     [provider]  chlorthalidone (HYGROTON) 25 MG tablet Take 25 mg by mouth daily.    [provider]  cholecalciferol (VITAMIN D) 25 MCG (1000 UT) tablet Take 1,000 Units by mouth daily.    [provider]  famotidine (PEPCID) 40 MG tablet Take 1 tablet (40 mg total) by mouth every evening. Patient taking differently: Take 40 mg by mouth daily.  09/11/17 12/25/18  Phineas SemenGoodman, Graydon, MD  gabapentin (NEURONTIN) 300 MG capsule Take 300 mg by mouth daily as needed (for nerve pain).    [provider]  hydrOXYzine (ATARAX/VISTARIL) 25 MG tablet Take 1 tablet (25 mg total) by mouth 3 (three) times daily as needed for anxiety (anxiety or sleep). 07/05/19   Shaune PollackIsaacs, Zerek Litsey, MD  ibuprofen (ADVIL) 600 MG tablet Take 1 tablet (600 mg total) by  mouth every 8 (eight) hours as needed for moderate pain. 07/05/19   Shaune Pollack, MD  magnesium oxide (MAG-OX) 400 MG tablet Take 400 mg by mouth daily.     [provider]  nebivolol (BYSTOLIC) 5 MG tablet Take 5 mg by mouth 2 (two) times daily.     [provider]  Potassium Chloride ER 20 MEQ TBCR TAKE 20 MEQ BY MOUTH AS DIRECTED. TAKE 1 TABLET ONCE DAILY AND 2 TABLETS 2 DAYS A WEEK. 01/22/19   Gollan, Tollie Pizza, MD  propranolol (INDERAL) 20 MG tablet Take 1 tablet (20 mg total) by mouth 3 (three) times daily as needed. For  breakthrough palpitations. 12/24/18   Antonieta Iba, MD  valACYclovir (VALTREX) 500 MG tablet Take 500 mg by mouth 2 (two) times daily as needed (for flares).     [provider]    Allergies Patient has no known allergies.  Family History  Problem Relation Age of Onset   Breast cancer Neg Hx     Social History Social History   Tobacco Use   Smoking status: Former Smoker    Years: 20.00    Quit date: 03/02/2009    Years since quitting: 10.3   Smokeless tobacco: Never Used  Substance Use Topics   Alcohol use: Yes    Comment: occ   Drug use: No    Review of Systems  Review of Systems  Constitutional: Positive for fatigue. Negative for fever.  HENT: Negative for congestion and sore throat.   Eyes: Negative for visual disturbance.  Respiratory: Positive for chest tightness. Negative for cough and shortness of breath.   Cardiovascular: Positive for palpitations. Negative for chest pain.  Gastrointestinal: Negative for abdominal pain, diarrhea, nausea and vomiting.  Genitourinary: Negative for flank pain.  Musculoskeletal: Negative for back pain and neck pain.  Skin: Negative for rash and wound.  Neurological: Positive for headaches. Negative for weakness.  Psychiatric/Behavioral: Positive for sleep disturbance. The patient is nervous/anxious.   All other systems reviewed and are negative.    ____________________________________________  PHYSICAL EXAM:      VITAL SIGNS: ED Triage Vitals  Enc Vitals Group     BP 07/05/19 1752 135/81     Pulse Rate 07/05/19 1752 60     Resp 07/05/19 1752 18     Temp 07/05/19 1752 98.5 F (36.9 C)     Temp Source 07/05/19 1752 Oral     SpO2 07/05/19 1939 99 %     Weight 07/05/19 1749 176 lb (79.8 kg)     Height 07/05/19 1749 5' (1.524 m)     Head Circumference --      Peak Flow --      Pain Score 07/05/19 1749 7     Pain Loc --      Pain Edu? --      Excl. in GC? --      Physical Exam Vitals signs and  nursing note reviewed.  Constitutional:      General: She is not in acute distress.    Appearance: She is well-developed.  HENT:     Head: Normocephalic and atraumatic.  Eyes:     Conjunctiva/sclera: Conjunctivae normal.  Neck:     Musculoskeletal: Neck supple.  Cardiovascular:     Rate and Rhythm: Normal rate and regular rhythm.     Heart sounds: Normal heart sounds. No murmur. No friction rub.  Pulmonary:     Effort: Pulmonary effort is normal. No respiratory distress.  Breath sounds: Normal breath sounds. No wheezing or rales.  Abdominal:     General: There is no distension.     Palpations: Abdomen is soft.     Tenderness: There is no abdominal tenderness.  Skin:    General: Skin is warm.     Capillary Refill: Capillary refill takes less than 2 seconds.  Neurological:     Mental Status: She is alert and oriented to person, place, and time.     Motor: No abnormal muscle tone.      Neurological Exam:  Mental Status: Alert and oriented to person, place, and time. Attention and concentration normal. Speech clear. Recent memory is intact. Cranial Nerves: Visual fields grossly intact. EOMI and PERRLA. No nystagmus noted. Facial sensation intact at forehead, maxillary cheek, and chin/mandible bilaterally. No facial asymmetry or weakness. Hearing grossly normal. Uvula is midline, and palate elevates symmetrically. Normal SCM and trapezius strength. Tongue midline without fasciculations. Motor: Muscle strength 5/5 in proximal and distal UE and LE bilaterally. No pronator drift. Muscle tone normal. Reflexes: 2+ and symmetrical in all four extremities.  Sensation: Intact to light touch in upper and lower extremities distally bilaterally.  Gait: Normal without ataxia. Coordination: Normal FTN bilaterally.    ____________________________________________   LABS (all labs ordered are listed, but only abnormal results are displayed)  Labs Reviewed  BASIC METABOLIC PANEL -  Abnormal; Notable for the following components:      Result Value   Glucose, Bld 102 (*)    All other components within normal limits  CBC  POC URINE PREG, ED  POCT PREGNANCY, URINE  TROPONIN I (HIGH SENSITIVITY)  TROPONIN I (HIGH SENSITIVITY)    ____________________________________________  EKG:  Normal sinus rhythm, ventricular rate 65.  PR 130, QRS 84, QTc 420.  No acute ST or T-segment change pain ischemia or infarct. ________________________________________  RADIOLOGY All imaging, including plain films, CT scans, and ultrasounds, independently reviewed by me, and interpretations confirmed via formal radiology reads.  ED MD interpretation:   Chest x-ray: Negative  Official radiology report(s): Dg Chest 2 View  Result Date: 07/05/2019 CLINICAL DATA:  Pt states chest pressure and tingling in left arm starting yesterday. Pt states headache for 2 weeks. Former smoker.10/21/2018 EXAM: CHEST - 2 VIEW COMPARISON:  10/21/2018 FINDINGS: Normal mediastinum and cardiac silhouette. Normal pulmonary vasculature. No evidence of effusion, infiltrate, or pneumothorax. No acute bony abnormality. IMPRESSION: Normal chest radiograph. Electronically Signed   By: Suzy Bouchard M.D.   On: 07/05/2019 18:29   Ct Head Wo Contrast  Result Date: 07/05/2019 CLINICAL DATA:  Headache. EXAM: CT HEAD WITHOUT CONTRAST TECHNIQUE: Contiguous axial images were obtained from the base of the skull through the vertex without intravenous contrast. COMPARISON:  CT scan of November 22, 2016. FINDINGS: Brain: No evidence of acute infarction, hemorrhage, hydrocephalus, extra-axial collection or mass lesion/mass effect. Vascular: No hyperdense vessel or unexpected calcification. Skull: Normal. Negative for fracture or focal lesion. Sinuses/Orbits: No acute finding. Other: None. IMPRESSION: Normal head CT. Electronically Signed   By: Marijo Conception M.D.   On: 07/05/2019 21:24     ____________________________________________  PROCEDURES   Procedure(s) performed (including Critical Care):  Procedures  ____________________________________________  INITIAL IMPRESSION / MDM / [place] / ED COURSE  As part of my medical decision making, I reviewed the following data within the [place] was evaluated in Emergency Department on 07/05/2019 for the symptoms described in the history  of present illness. She was evaluated in the context of the global COVID-19 pandemic, which necessitated consideration that the patient might be at risk for infection with the SARS-CoV-2 virus that causes COVID-19. Institutional protocols and algorithms that pertain to the evaluation of patients at risk for COVID-19 are in a state of rapid change based on information released by regulatory bodies including the CDC and federal and state organizations. These policies and algorithms were followed during the patient's care in the ED.  Some ED evaluations and interventions may be delayed as a result of limited staffing during the pandemic.*      Medical Decision Making: 53 year old female here with multiple complaints.  I suspect most of her symptoms may be secondary to primary anxiety versus hypertension exacerbated in the setting of anxiety.  She admits to her multiple recent stressors including working at the health department with coronavirus exposures.  She has no fever or symptoms to suggest coronavirus in herself.  She is screened regularly at work, so do not feel repeat screening is necessary.  Regarding her headache, this seems to be tension type and distribution.  She has no focal neurological deficits.  The headache began very gradually and I do not suspect subarachnoid.  No fever, chills, or signs of meningitis or encephalitis.  However, given that she does admit this is a new headache, will check CT to evaluate for concomitant severe  sinus disease, mass, or large ischemia.  Otherwise, her chest pain is highly atypical in nature.  Her troponins are negative x2 with negative EKG and I do not suspect ACS.  Symptoms are not consistent with PE or dissection.  This could also be stress related.  Plan at time of signout is to follow-up CT.  If negative, will elect to treat for possible anxiety and headache with Atarax as well as as needed medication for her headache.  She will follow-up with her primary care doctor.  Encouraged good sleep hygiene.  Return precautions given.  ____________________________________________  FINAL CLINICAL IMPRESSION(S) / ED DIAGNOSES  Final diagnoses:  Atypical chest pain  Acute non intractable tension-type headache     MEDICATIONS GIVEN DURING THIS VISIT:  Medications  prochlorperazine (COMPAZINE) injection 10 mg (10 mg Intravenous Given 07/05/19 2042)  diphenhydrAMINE (BENADRYL) injection 25 mg (25 mg Intravenous Given 07/05/19 2041)  sodium chloride 0.9 % bolus 1,000 mL (0 mLs Intravenous Stopped 07/05/19 2157)     ED Discharge Orders         Ordered    hydrOXYzine (ATARAX/VISTARIL) 25 MG tablet  3 times daily PRN     07/05/19 2104    ibuprofen (ADVIL) 600 MG tablet  Every 8 hours PRN     07/05/19 2104           Note:  This document was prepared using Dragon voice recognition software and may include unintentional dictation errors.   Shaune Pollack, MD 07/05/19 2216

## 2019-07-05 NOTE — ED Notes (Signed)
Urine sent to lab 

## 2019-07-05 NOTE — ED Triage Notes (Signed)
Pt reports headache for the past 2 weeks and reports bilat jaw pain, pt states this am she started having burning in her left arm and left side of chest.

## 2019-07-05 NOTE — ED Provider Notes (Signed)
Vitals:   07/05/19 2100 07/05/19 2130  BP: 123/83 107/69  Pulse: (!) 56 (!) 58  Resp: 15 13  Temp:    SpO2: 99% 97%      Patient resting comfortably, reports she feels much better.  Comfortable the plan for discharge.  Patient understands not to drive this evening as she was getting medications that do cause sedation, she and her husband are agreeable with this plan her husband will be driving her home  Return precautions and treatment recommendations and follow-up discussed with the patient who is agreeable with the plan.    Delman Kitten, MD 07/05/19 2222

## 2019-07-08 ENCOUNTER — Ambulatory Visit
Admission: RE | Admit: 2019-07-08 | Discharge: 2019-07-08 | Disposition: A | Discharge status: Home / Self Care | Payer: 59 | Source: Ambulatory Visit | Attending: Family Medicine | Admitting: Family Medicine

## 2019-07-08 DIAGNOSIS — Z1231 Encounter for screening mammogram for malignant neoplasm of breast: Secondary | ICD-10-CM | POA: Diagnosis present

## 2019-07-17 ENCOUNTER — Other Ambulatory Visit: Payer: Self-pay | Admitting: Neurology

## 2019-07-17 DIAGNOSIS — M5416 Radiculopathy, lumbar region: Secondary | ICD-10-CM

## 2019-07-28 ENCOUNTER — Other Ambulatory Visit: Payer: Self-pay

## 2019-07-28 ENCOUNTER — Ambulatory Visit
Admission: RE | Admit: 2019-07-28 | Discharge: 2019-07-28 | Disposition: A | Discharge status: Home / Self Care | Payer: 59 | Source: Ambulatory Visit | Attending: Neurology | Admitting: Neurology

## 2019-07-28 DIAGNOSIS — M5416 Radiculopathy, lumbar region: Secondary | ICD-10-CM | POA: Diagnosis not present

## 2019-08-05 ENCOUNTER — Encounter: Payer: Self-pay | Admitting: Emergency Medicine

## 2019-08-05 ENCOUNTER — Emergency Department: Payer: 59

## 2019-08-05 DIAGNOSIS — R0789 Other chest pain: Secondary | ICD-10-CM | POA: Diagnosis not present

## 2019-08-05 DIAGNOSIS — E876 Hypokalemia: Secondary | ICD-10-CM | POA: Insufficient documentation

## 2019-08-05 DIAGNOSIS — I10 Essential (primary) hypertension: Secondary | ICD-10-CM | POA: Diagnosis not present

## 2019-08-05 DIAGNOSIS — F419 Anxiety disorder, unspecified: Secondary | ICD-10-CM | POA: Diagnosis not present

## 2019-08-05 DIAGNOSIS — Z79899 Other long term (current) drug therapy: Secondary | ICD-10-CM | POA: Diagnosis not present

## 2019-08-05 DIAGNOSIS — Z87891 Personal history of nicotine dependence: Secondary | ICD-10-CM | POA: Insufficient documentation

## 2019-08-05 DIAGNOSIS — R002 Palpitations: Secondary | ICD-10-CM | POA: Diagnosis present

## 2019-08-05 DIAGNOSIS — E119 Type 2 diabetes mellitus without complications: Secondary | ICD-10-CM | POA: Diagnosis not present

## 2019-08-05 LAB — BASIC METABOLIC PANEL
Anion gap: 10 (ref 5–15)
BUN: 12 mg/dL (ref 6–20)
CO2: 30 mmol/L (ref 22–32)
Calcium: 9.6 mg/dL (ref 8.9–10.3)
Chloride: 100 mmol/L (ref 98–111)
Creatinine, Ser: 0.76 mg/dL (ref 0.44–1.00)
GFR calc Af Amer: >60 mL/min (ref >60)
GFR calc non Af Amer: >60 mL/min (ref >60)
Glucose, Bld: 151 mg/dL — ABNORMAL HIGH (ref 70–99)
Potassium: 3.3 mmol/L — ABNORMAL LOW (ref 3.5–5.1)
Sodium: 140 mmol/L (ref 135–145)

## 2019-08-05 LAB — CBC
HCT: 38.5 % (ref 36.0–46.0)
Hemoglobin: 13.2 g/dL (ref 12.0–15.0)
MCH: 30.3 pg (ref 26.0–34.0)
MCHC: 34.3 g/dL (ref 30.0–36.0)
MCV: 88.3 fL (ref 80.0–100.0)
Platelets: 247 10*3/uL (ref 150–400)
RBC: 4.36 MIL/uL (ref 3.87–5.11)
RDW: 12.1 % (ref 11.5–15.5)
WBC: 7 10*3/uL (ref 4.0–10.5)
nRBC: 0 % (ref 0.0–0.2)

## 2019-08-05 LAB — TROPONIN I (HIGH SENSITIVITY): Troponin I (High Sensitivity): 8 ng/L (ref <18)
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
## 2019-08-06 ENCOUNTER — Emergency Department
Admission: EM | Admit: 2019-08-06 | Discharge: 2019-08-06 | Disposition: A | Discharge status: Home / Self Care | Payer: 59 | Attending: Emergency Medicine | Admitting: Emergency Medicine

## 2019-08-06 ENCOUNTER — Ambulatory Visit (INDEPENDENT_AMBULATORY_CARE_PROVIDER_SITE_OTHER): Payer: 59 | Admitting: Cardiovascular Disease

## 2019-08-06 ENCOUNTER — Other Ambulatory Visit: Payer: Self-pay

## 2019-08-06 ENCOUNTER — Encounter: Payer: Self-pay | Admitting: Cardiovascular Disease

## 2019-08-06 VITALS — BP 122/82 | HR 65 | Ht 60.0 in | Wt 175.0 lb

## 2019-08-06 DIAGNOSIS — I10 Essential (primary) hypertension: Secondary | ICD-10-CM | POA: Diagnosis not present

## 2019-08-06 DIAGNOSIS — R0789 Other chest pain: Secondary | ICD-10-CM

## 2019-08-06 DIAGNOSIS — R079 Chest pain, unspecified: Secondary | ICD-10-CM

## 2019-08-06 DIAGNOSIS — R002 Palpitations: Secondary | ICD-10-CM | POA: Diagnosis not present

## 2019-08-06 DIAGNOSIS — I479 Paroxysmal tachycardia, unspecified: Secondary | ICD-10-CM | POA: Diagnosis not present

## 2019-08-06 DIAGNOSIS — E876 Hypokalemia: Secondary | ICD-10-CM

## 2019-08-06 DIAGNOSIS — F419 Anxiety disorder, unspecified: Secondary | ICD-10-CM

## 2019-08-06 NOTE — Patient Instructions (Addendum)
Medication Instructions:  Try chlorthalidone every other day M/W/Fri/Sat Take potassium  20 meq daily   If you need a refill on your cardiac medications before your next appointment, please call your pharmacy.    Lab work: No new labs needed   If you have labs (blood work) drawn today and your tests are completely normal, you will receive your results only by: Marland Kitchen MyChart Message (if you have MyChart) OR . A paper copy in the mail If you have any lab test that is abnormal or we need to change your treatment, we will call you to review the results.   Testing/Procedures: No new testing needed   Follow-Up: At [facility], you and your health needs are our priority.  As part of our continuing mission to provide you with exceptional heart care, we have created designated Provider Care Teams.  These Care Teams include your primary Cardiologist (physician) and Advanced Practice Providers (APPs -  Physician Assistants and Nurse Practitioners) who all work together to provide you with the care you need, when you need it.  . You will need a follow up appointment as needed   . Providers on your designated Care Team:   . Murray Hodgkins, NP . Christell Faith, PA-C . Marrianne Mood, PA-C  Any Other Special Instructions Will Be Listed Below (If Applicable).  For educational health videos Log in to : [email or web address] Or : [email or web address], password : [email or web address]
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
## 2019-08-06 NOTE — ED Provider Notes (Signed)
[facility] Emergency Department Provider Note  ____________________________________________   First MD Initiated Contact with Patient 08/06/19 0028     (approximate)  I have reviewed the triage vital signs and the nursing notes.   HISTORY  Chief Complaint Chest Pain    HPI Felicia Barnett is a 53 y.o. female with medical history as listed below which notably includes SVT status post ablation and anxiety.  She presents for evaluation of intermittent palpitations and some chest discomfort that occurred after she had a verbal altercation with someone earlier today.  She reports that it was an unfortunate verbal encounter where someone was being mean to her and yelling at her, after which point she felt like her heart was racing and she had some chest discomfort.  It is much better now but she still sometimes feels like she may be having palpitations.  She is concerned about her potassium and said that she has potassium supplements and took 120 mEq tablet earlier this evening but was wondering if she needed an additional 1.  Of note, she has an appointment with her cardiologist later today and she plans to keep that appointment.  She has had no contact with COVID-19 patients.  She denies sore throat, shortness of breath, nausea, vomiting, abdominal pain, sweating, numbness nor weakness in her extremities, nor dysuria.  She reports that the onset of the symptoms was acute and they were initially severe but now they are minimal if not completely resolved.       Past Medical History:  Diagnosis Date   Anxiety    since surgery, pt has had attacks w/ fear of not being able to move her leg (anxiety attacks occur  3-4x month.)   Diabetes mellitus without complication (HCC)    pre-diabetes   Heart murmur    palpatiations   Hyperlipidemia    Hypertension    SVT (supraventricular tachycardia) [facility])     Patient Active Problem List   Diagnosis Date Noted    Paroxysmal tachycardia (HCC) 12/24/2018   Chest pain 12/24/2018   Palpitations 12/24/2018   Essential hypertension 12/24/2018   Low potassium syndrome 12/24/2018   PVC's (premature ventricular contractions) 12/24/2018   Other fatigue 12/24/2018    Past Surgical History:  Procedure Laterality Date   ABDOMINAL HYSTERECTOMY     BREAST BIOPSY Right    neg   cardial ablation     anxiety attack (heart palpitations HR=210 bpm) the day she was released from the hospital from surgery. Findings were neg .   COLONOSCOPY      Prior to Admission medications   Medication Sig Start Date End Date Taking? Authorizing Provider  Ascorbic Acid (VITAMIN C) 100 MG tablet Take 100 mg by mouth daily.    [provider]  b complex vitamins capsule Take by mouth.    [provider]  calcium carbonate (TUMS - DOSED IN MG ELEMENTAL CALCIUM) 500 MG chewable tablet Chew 1-2 tablets by mouth 2 (two) times daily as needed for indigestion or heartburn.     [provider]  chlorthalidone (HYGROTON) 25 MG tablet Take 25 mg by mouth daily.    [provider]  cholecalciferol (VITAMIN D) 25 MCG (1000 UT) tablet Take 1,000 Units by mouth daily.    [provider]  famotidine (PEPCID) 40 MG tablet Take 1 tablet (40 mg total) by mouth every evening. Patient taking differently: Take 40 mg by mouth daily.  09/11/17 12/25/18  Phineas Semen, MD  gabapentin (  NEURONTIN) 300 MG capsule Take 300 mg by mouth daily as needed (for nerve pain).    [provider]  hydrOXYzine (ATARAX/VISTARIL) 25 MG tablet Take 1 tablet (25 mg total) by mouth 3 (three) times daily as needed for anxiety (anxiety or sleep). 07/05/19   Duffy Bruce, MD  ibuprofen (ADVIL) 600 MG tablet Take 1 tablet (600 mg total) by mouth every 8 (eight) hours as needed for moderate pain. 07/05/19   Duffy Bruce, MD  magnesium oxide (MAG-OX) 400 MG tablet Take 400 mg by mouth daily.     [provider]  nebivolol (BYSTOLIC) 5 MG tablet Take 5 mg by mouth 2 (two) times daily.     [provider]  Potassium Chloride ER 20 MEQ TBCR TAKE 20 MEQ BY MOUTH AS DIRECTED. TAKE 1 TABLET ONCE DAILY AND 2 TABLETS 2 DAYS A WEEK. 01/22/19   Gollan, Kathlene November, MD  propranolol (INDERAL) 20 MG tablet Take 1 tablet (20 mg total) by mouth 3 (three) times daily as needed. For breakthrough palpitations. 12/24/18   Minna Merritts, MD  valACYclovir (VALTREX) 500 MG tablet Take 500 mg by mouth 2 (two) times daily as needed (for flares).     [provider]    Allergies Patient has no known allergies.  Family History  Problem Relation Age of Onset   Breast cancer Neg Hx     Social History Social History   Tobacco Use   Smoking status: Former Smoker    Years: 20.00    Quit date: 03/02/2009    Years since quitting: 10.4   Smokeless tobacco: Never Used  Substance Use Topics   Alcohol use: Yes    Comment: occ   Drug use: No    Review of Systems Constitutional: No fever/chills Eyes: No visual changes. ENT: No sore throat. Cardiovascular: Palpitations and chest discomfort. Respiratory: Denies shortness of breath. Gastrointestinal: No abdominal pain.  No nausea, no vomiting.  No diarrhea.  No constipation. Genitourinary: Negative for dysuria. Musculoskeletal: Negative for neck pain.  Negative for back pain. Integumentary: Negative for rash. Neurological: Negative for headaches, focal weakness or numbness.   ____________________________________________   PHYSICAL EXAM:  VITAL SIGNS: ED Triage Vitals [08/05/19 1957]  Enc Vitals Group     BP 122/76     Pulse Rate 80     Resp 16     Temp 98.8 F (37.1 C)     Temp Source Oral     SpO2 98 %     Weight      Height      Head Circumference      Peak Flow      Pain Score      Pain Loc      Pain Edu?      Excl. in [place]?     Constitutional: Alert and oriented.  Well-appearing and in no acute  distress. Eyes: Conjunctivae are normal.  Head: Atraumatic. Nose: No congestion/rhinnorhea. Mouth/Throat: Patient is wearing a mask. Neck: No stridor.  No meningeal signs.   Cardiovascular: Normal rate, regular rhythm. Good peripheral circulation. Grossly normal heart sounds. Respiratory: Normal respiratory effort.  No retractions. Gastrointestinal: Soft and nontender. No distention.  Musculoskeletal: No lower extremity tenderness nor edema. No gross deformities of extremities. Neurologic:  Normal speech and language. No gross focal neurologic deficits are appreciated.  Skin:  Skin is warm, dry and intact. Psychiatric: Mood and affect are normal. Speech and behavior are normal.  ____________________________________________   LABS (all labs  ordered are listed, but only abnormal results are displayed)  Labs Reviewed  BASIC METABOLIC PANEL - Abnormal; Notable for the following components:      Result Value   Potassium 3.3 (*)    Glucose, Bld 151 (*)    All other components within normal limits  CBC  POC URINE PREG, ED  TROPONIN I (HIGH SENSITIVITY)   ____________________________________________  EKG  ED ECG REPORT I, Loleta Roseory Ruthanna Macchia, the attending physician, personally viewed and interpreted this ECG.  Date: 08/05/2019 EKG Time: 20:01 Rate: 80 Rhythm: normal sinus rhythm with occasional premature atrial complexes QRS Axis: normal Intervals: normal ST/T Wave abnormalities: normal Narrative Interpretation: no evidence of acute ischemia   ____________________________________________  RADIOLOGY I, Loleta Roseory Jenine Krisher, personally viewed and evaluated these images (plain radiographs) as part of my medical decision making, as well as reviewing the written report by the radiologist.  ED MD interpretation: No acute abnormalities on chest x-ray.  Official radiology report(s): Dg Chest 2 View  Result Date: 08/05/2019 CLINICAL DATA:  Chest pain EXAM: CHEST - 2 VIEW COMPARISON:   Radiograph 07/05/2019, CT 10/07/2013 FINDINGS: No consolidation, features of edema, pneumothorax, or effusion. Pulmonary vascularity is normally distributed. The cardiomediastinal contours are unremarkable. No acute osseous or soft tissue abnormality. Rounded density seen within the bowel loops on lateral radiograph may reflect ingested material. IMPRESSION: No acute cardiopulmonary abnormality. Electronically Signed   By: Kreg ShropshirePrice  DeHay M.D.   On: 08/05/2019 20:32    ____________________________________________   PROCEDURES   Procedure(s) performed (including Critical Care):  Procedures   ____________________________________________   INITIAL IMPRESSION / MDM / ASSESSMENT AND PLAN / ED COURSE  As part of my medical decision making, I reviewed the following data within the electronic MEDICAL RECORD NUMBER Nursing notes reviewed and incorporated, Labs reviewed , EKG interpreted , Old chart reviewed, Radiograph reviewed  and Notes from prior ED visits   Differential diagnosis includes, but is not limited to, anxiety/panic attack, SVT, electrolyte abnormality, much less likely ACS or PE.  The patient is calm, cooperative, and asymptomatic at this time.  She has been in the emergency department for almost 5-1/2 hours.  Her vital signs are normal and stable, EKG reassuring, and lab work all appropriate other than very slightly decreased potassium of 3.3.  The high-sensitivity troponin of 8 is reassuring in the setting of being drawn more than 3 hours after the episode where the pain started and she has a low HEAR score.  She was reassured after finding out about her EKG being normal and me telling her that she could take another potassium supplement for the potassium of 3.3.  She will follow-up later today with her cardiologist as planned and I gave my usual and customary return precautions.  I do not feel that it is necessary at this point to get a second high-sensitivity troponin based on my own clinical  judgment and the scenario presented to me at this time.          ____________________________________________  FINAL CLINICAL IMPRESSION(S) / ED DIAGNOSES  Final diagnoses:  Chest discomfort  Palpitations  Anxiety  Hypokalemia     MEDICATIONS GIVEN DURING THIS VISIT:  Medications - No data to display   ED Discharge Orders    None      *Please note:  Lesia HausenValentina I Schlottman was evaluated in Emergency Department on 08/06/2019 for the symptoms described in the history of present illness. She was evaluated in the context of the global COVID-19 pandemic, which necessitated consideration that  the patient might be at risk for infection with the SARS-CoV-2 virus that causes COVID-19. Institutional protocols and algorithms that pertain to the evaluation of patients at risk for COVID-19 are in a state of rapid change based on information released by regulatory bodies including the CDC and federal and state organizations. These policies and algorithms were followed during the patient's care in the ED.  Some ED evaluations and interventions may be delayed as a result of limited staffing during the pandemic.*  Note:  This document was prepared using Dragon voice recognition software and may include unintentional dictation errors.   Loleta Rose, MD 08/06/19 [phone]

## 2019-08-06 NOTE — Discharge Instructions (Signed)
Your workup in the Emergency Department today was reassuring.  We did not find any specific abnormalities other than a very slightly decreased potassium of 3.3 for which you already took 2 supplements tonight.  You may take another two 20 mEq potassium supplements tomorrow but after that point you should return to 1 daily and less your cardiologist recommends otherwise..  We recommend you drink plenty of fluids, take your regular medications and/or any new ones prescribed today, and follow up with the doctor(s) listed in these documents as recommended.  Return to the Emergency Department if you develop new or worsening symptoms that concern you.

## 2019-10-11 ENCOUNTER — Other Ambulatory Visit: Payer: Self-pay

## 2019-10-11 ENCOUNTER — Encounter: Payer: Self-pay | Admitting: Emergency Medicine

## 2019-10-11 ENCOUNTER — Emergency Department
Admission: EM | Admit: 2019-10-11 | Discharge: 2019-10-12 | Disposition: A | Discharge status: Home / Self Care | Payer: 59 | Attending: Emergency Medicine | Admitting: Emergency Medicine

## 2019-10-11 ENCOUNTER — Emergency Department: Payer: 59

## 2019-10-11 DIAGNOSIS — R079 Chest pain, unspecified: Secondary | ICD-10-CM

## 2019-10-11 DIAGNOSIS — E119 Type 2 diabetes mellitus without complications: Secondary | ICD-10-CM | POA: Insufficient documentation

## 2019-10-11 DIAGNOSIS — Z87891 Personal history of nicotine dependence: Secondary | ICD-10-CM | POA: Insufficient documentation

## 2019-10-11 DIAGNOSIS — E876 Hypokalemia: Secondary | ICD-10-CM

## 2019-10-11 DIAGNOSIS — R0789 Other chest pain: Secondary | ICD-10-CM | POA: Insufficient documentation

## 2019-10-11 DIAGNOSIS — Z79899 Other long term (current) drug therapy: Secondary | ICD-10-CM | POA: Diagnosis not present

## 2019-10-11 DIAGNOSIS — Z20822 Contact with and (suspected) exposure to covid-19: Secondary | ICD-10-CM | POA: Insufficient documentation

## 2019-10-11 DIAGNOSIS — I10 Essential (primary) hypertension: Secondary | ICD-10-CM | POA: Diagnosis not present

## 2019-10-11 HISTORY — DX: Other specified postprocedural states: Z98.890

## 2019-10-11 LAB — CBC WITH DIFFERENTIAL/PLATELET
Abs Immature Granulocytes: 0.03 10*3/uL (ref 0.00–0.07)
Basophils Absolute: 0 10*3/uL (ref 0.0–0.1)
Basophils Relative: 1 %
Eosinophils Absolute: 0.2 10*3/uL (ref 0.0–0.5)
Eosinophils Relative: 3 %
HCT: 35.1 % — ABNORMAL LOW (ref 36.0–46.0)
Hemoglobin: 12.9 g/dL (ref 12.0–15.0)
Immature Granulocytes: 1 %
Lymphocytes Relative: 49 %
Lymphs Abs: 3.1 10*3/uL (ref 0.7–4.0)
MCH: 30.7 pg (ref 26.0–34.0)
MCHC: 36.8 g/dL — ABNORMAL HIGH (ref 30.0–36.0)
MCV: 83.6 fL (ref 80.0–100.0)
Monocytes Absolute: 0.5 10*3/uL (ref 0.1–1.0)
Monocytes Relative: 8 %
Neutro Abs: 2.4 10*3/uL (ref 1.7–7.7)
Neutrophils Relative %: 38 %
Platelets: 241 10*3/uL (ref 150–400)
RBC: 4.2 MIL/uL (ref 3.87–5.11)
RDW: 12.5 % (ref 11.5–15.5)
WBC: 6.2 10*3/uL (ref 4.0–10.5)
nRBC: 0 % (ref 0.0–0.2)

## 2019-10-11 LAB — BASIC METABOLIC PANEL
Anion gap: 11 (ref 5–15)
BUN: 18 mg/dL (ref 6–20)
CO2: 26 mmol/L (ref 22–32)
Calcium: 9.4 mg/dL (ref 8.9–10.3)
Chloride: 102 mmol/L (ref 98–111)
Creatinine, Ser: 0.77 mg/dL (ref 0.44–1.00)
GFR calc Af Amer: >60 mL/min (ref >60)
GFR calc non Af Amer: >60 mL/min (ref >60)
Glucose, Bld: 115 mg/dL — ABNORMAL HIGH (ref 70–99)
Potassium: 3.2 mmol/L — ABNORMAL LOW (ref 3.5–5.1)
Sodium: 139 mmol/L (ref 135–145)

## 2019-10-11 LAB — MAGNESIUM: Magnesium: 1.9 mg/dL (ref 1.7–2.4)

## 2019-10-11 LAB — TROPONIN I (HIGH SENSITIVITY)
Troponin I (High Sensitivity): 4 ng/L (ref <18)
Troponin I (High Sensitivity): 6 ng/L (ref <18)

## 2019-10-11 MED ORDER — POTASSIUM CHLORIDE CRYS ER 20 MEQ PO TBCR
40.0000 meq | EXTENDED_RELEASE_TABLET | Freq: Once | ORAL | Status: AC
  Administered 2019-10-11: 23:00:00 40 meq via ORAL
  Filled 2019-10-11: qty 2

## 2019-10-11 NOTE — Discharge Instructions (Addendum)
Please seek medical attention for any high fevers, chest pain, shortness of breath, change in behavior, persistent vomiting, bloody stool or any other new or concerning symptoms.  

## 2019-10-11 NOTE — ED Provider Notes (Signed)
[facility] Emergency Department Provider Note  ____________________________________________   I have reviewed the triage vital signs and the nursing notes.   HISTORY  Chief Complaint Chest Pain   History limited by: Not Limited   HPI Felicia Barnett is a 54 y.o. female who presents to the emergency department today because of concern for chest pain. Located in the center and left part of her chest. Describes it as a tightness. She has felt bad over the past few days with weakness and dizziness. Has noticed that her blood pressure has been high over the past couple of days. Has had problems with low potassium in the past so has tried taking potassium pills over the past couple of days. Today took another potassium Denies any fevers.   Records reviewed. Per medical record review patient has a history of HLD, HTN, SVT.   Past Medical History:  Diagnosis Date  . Anxiety    since surgery, pt has had attacks w/ fear of not being able to move her leg (anxiety attacks occur  3-4x month.)  . Diabetes mellitus without complication (HCC)    pre-diabetes  . H/O prior ablation treatment   . Heart murmur    palpatiations  . Hyperlipidemia   . Hypertension   . SVT (supraventricular tachycardia) [facility])     Patient Active Problem List   Diagnosis Date Noted  . Paroxysmal tachycardia ([place]) 12/24/2018  . Chest pain 12/24/2018  . Palpitations 12/24/2018  . Essential hypertension 12/24/2018  . Low potassium syndrome 12/24/2018  . PVC's (premature ventricular contractions) 12/24/2018  . Other fatigue 12/24/2018    Past Surgical History:  Procedure Laterality Date  . ABDOMINAL HYSTERECTOMY    . BREAST BIOPSY Right    neg  . cardial ablation     anxiety attack (heart palpitations HR=210 bpm) the day she was released from the hospital from surgery. Findings were neg .  Marland Kitchen COLONOSCOPY      Prior to Admission medications   Medication Sig Start Date End Date  Taking? Authorizing Provider  Ascorbic Acid (VITAMIN C) 100 MG tablet Take 100 mg by mouth daily.    [provider]  b complex vitamins capsule Take by mouth.    [provider]  calcium carbonate (TUMS - DOSED IN MG ELEMENTAL CALCIUM) 500 MG chewable tablet Chew 1-2 tablets by mouth 2 (two) times daily as needed for indigestion or heartburn.     [provider]  chlorthalidone (HYGROTON) 25 MG tablet Take 25 mg by mouth daily.    [provider]  cholecalciferol (VITAMIN D) 25 MCG (1000 UT) tablet Take 1,000 Units by mouth daily.    [provider]  famotidine (PEPCID) 40 MG tablet Take 1 tablet (40 mg total) by mouth every evening. Patient not taking: Reported on 08/06/2019 09/11/17 12/25/18  Nance Pear, MD  gabapentin (NEURONTIN) 300 MG capsule Take 300 mg by mouth daily as needed (for nerve pain).    [provider]  hydrOXYzine (ATARAX/VISTARIL) 25 MG tablet Take 1 tablet (25 mg total) by mouth 3 (three) times daily as needed for anxiety (anxiety or sleep). 07/05/19   Duffy Bruce, MD  ibuprofen (ADVIL) 600 MG tablet Take 1 tablet (600 mg total) by mouth every 8 (eight) hours as needed for moderate pain. Patient not taking: Reported on 08/06/2019 07/05/19   Duffy Bruce, MD  magnesium oxide (MAG-OX) 400 MG tablet Take 400 mg by mouth daily.     [provider]  nebivolol (BYSTOLIC) 5 MG tablet Take 5 mg by mouth 2 (two) times daily.     [provider]  omeprazole (PRILOSEC) 20 MG capsule Take 20 mg by mouth daily as needed.  04/08/19   [provider]  Potassium Chloride ER 20 MEQ TBCR TAKE 20 MEQ BY MOUTH AS DIRECTED. TAKE 1 TABLET ONCE DAILY AND 2 TABLETS 2 DAYS A WEEK. Patient taking differently: Take 20 mEq by mouth. Take 1 tablet once daily 01/22/19   Antonieta Iba, MD  propranolol (INDERAL) 20 MG tablet Take 1 tablet (20 mg total) by mouth 3 (three) times daily as needed. For breakthrough  palpitations. 12/24/18   Antonieta Iba, MD  valACYclovir (VALTREX) 500 MG tablet Take 500 mg by mouth 2 (two) times daily as needed (for flares).     [provider]    Allergies Patient has no known allergies.  Family History  Problem Relation Age of Onset  . Heart disease Mother   . Diabetes Mother   . Hypertension Mother   . Heart attack Father   . Diabetes Father   . Hypertension Father   . Breast cancer Neg Hx     Social History Social History   Tobacco Use  . Smoking status: Former Smoker    Years: 20.00    Quit date: 03/02/2009    Years since quitting: 10.6  . Smokeless tobacco: Never Used  Substance Use Topics  . Alcohol use: Yes    Comment: occ  . Drug use: No    Review of Systems Constitutional: No fever/chills Eyes: No visual changes. ENT: No sore throat. Cardiovascular: Positive for chest pain. Respiratory: Denies shortness of breath. Gastrointestinal: No abdominal pain.  No nausea, no vomiting.  No diarrhea.   Genitourinary: Negative for dysuria. Musculoskeletal: Negative for back pain. Skin: Negative for rash. Neurological: Negative for headaches, focal weakness or numbness.  ____________________________________________   PHYSICAL EXAM:  VITAL SIGNS: ED Triage Vitals  Enc Vitals Group     BP 10/11/19 2057 (!) 166/101     Pulse Rate 10/11/19 2057 90     Resp 10/11/19 2057 18     Temp 10/11/19 2057 98.9 F (37.2 C)     Temp Source 10/11/19 2057 Oral     SpO2 10/11/19 2057 99 %     Weight 10/11/19 2054 174 lb (78.9 kg)     Height 10/11/19 2054 5' (1.524 m)     Head Circumference --      Peak Flow --      Pain Score 10/11/19 2053 6   Constitutional: Alert and oriented.  Eyes: Conjunctivae are normal.  ENT      Head: Normocephalic and atraumatic.      Nose: No congestion/rhinnorhea.      Mouth/Throat: Mucous membranes are moist.      Neck: No stridor. Hematological/Lymphatic/Immunilogical: No cervical  lymphadenopathy. Cardiovascular: Normal rate, regular rhythm.  No murmurs, rubs, or gallops.  Respiratory: Normal respiratory effort without tachypnea nor retractions. Breath sounds are clear and equal bilaterally. No wheezes/rales/rhonchi. Gastrointestinal: Soft and non tender. No rebound. No guarding.  Genitourinary: Deferred Musculoskeletal: Normal range of motion in all extremities. No lower extremity edema. Neurologic:  Normal speech and language. No gross focal neurologic deficits are appreciated.  Skin:  Skin is warm, dry and intact. No rash noted. Psychiatric: Mood and affect are normal. Speech and behavior are normal. Patient exhibits appropriate insight and judgment.  ____________________________________________    LABS (pertinent positives/negatives)  Trop hs  4 CBC wbc 6.2, hgb 12.9, plt 241 BMP wnl except k 3.2, glu 115  ____________________________________________   EKG  I, Phineas Semen, attending physician, personally viewed and interpreted this EKG  EKG Time: 2054 Rate: 96 Rhythm: sinus rhythm Axis: normal Intervals: qtc 430 QRS: narrow ST changes: no st elevation Impression: normal ekg   ____________________________________________    RADIOLOGY  CXR No active disease  ____________________________________________   PROCEDURES  Procedures  ____________________________________________   INITIAL IMPRESSION / ASSESSMENT AND PLAN / ED COURSE  Pertinent labs & imaging results that were available during my care of the patient were reviewed by me and considered in my medical decision making (see chart for details).   Patient presented to the emergency department today with primary concern for chest pain. ddx would be broad including, pna, ptx, pe, dissection, acs, gerd. Troponin negative x 2. EKG and CXR without concerning findings. Doubt PE/dissection at this time given clinical history. Do think it is reasonable for patient to be discharged home.  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   ___________________________________________   FINAL CLINICAL IMPRESSION(S) / ED DIAGNOSES  Final diagnoses:  Nonspecific chest pain  Hypokalemia     Note: This dictation was prepared with Dragon dictation. Any transcriptional errors that result from this process are unintentional     Phineas Semen, MD 10/11/19 2352

## 2019-10-11 NOTE — ED Triage Notes (Signed)
Pt presents to ED via [facility] from home c/o mid and l-sided chest pain. Pt states she took x4 ASA and her potassium pills. Reports mild headache. EMS report FD obtained BP 210/102, EMS recheck was 165/94. P70-80s

## 2019-10-13 LAB — NOVEL CORONAVIRUS, NAA (HOSP ORDER, SEND-OUT TO REF LAB; TAT 18-24 HRS): SARS-CoV-2, NAA: NOT DETECTED

## 2019-10-17 ENCOUNTER — Ambulatory Visit: Payer: 59 | Admitting: Family

## 2019-10-23 ENCOUNTER — Encounter: Payer: Self-pay | Admitting: *Deleted

## 2019-10-23 ENCOUNTER — Ambulatory Visit (INDEPENDENT_AMBULATORY_CARE_PROVIDER_SITE_OTHER): Payer: 59 | Admitting: Family

## 2019-10-23 ENCOUNTER — Encounter: Payer: Self-pay | Admitting: Family

## 2019-10-23 ENCOUNTER — Other Ambulatory Visit: Payer: Self-pay

## 2019-10-23 VITALS — BP 112/78 | HR 58 | Ht 60.0 in | Wt 174.0 lb

## 2019-10-23 DIAGNOSIS — I493 Ventricular premature depolarization: Secondary | ICD-10-CM

## 2019-10-23 DIAGNOSIS — R0789 Other chest pain: Secondary | ICD-10-CM | POA: Diagnosis not present

## 2019-10-23 DIAGNOSIS — Z13220 Encounter for screening for lipoid disorders: Secondary | ICD-10-CM

## 2019-10-23 DIAGNOSIS — R002 Palpitations: Secondary | ICD-10-CM | POA: Diagnosis not present

## 2019-10-23 DIAGNOSIS — E876 Hypokalemia: Secondary | ICD-10-CM

## 2019-10-23 DIAGNOSIS — I10 Essential (primary) hypertension: Secondary | ICD-10-CM | POA: Diagnosis not present

## 2019-10-23 MED ORDER — PROPRANOLOL HCL 20 MG PO TABS: 20.0000 mg | ORAL_TABLET | Freq: Three times a day (TID) | ORAL | 3 refills | Status: DC | PRN

## 2019-10-23 NOTE — Progress Notes (Signed)
Office Visit    Patient Name: [name] Date of Encounter: 10/23/2019  Primary Care Provider:  Emogene Morgan, MD Primary Cardiologist:  Julien Nordmann, MD Electrophysiologist:  None   Chief Complaint    Felicia Barnett is a 54 y.o. female with a hx of  HTN, DM2, palpitation, HLD, anxiety, chest pain, hypokalemia, OSA on CPAP, prior tobacco abuse  presents today for follow up after ED visit for chest pain.   Past Medical History    Past Medical History:  Diagnosis Date  . Anxiety    since surgery, pt has had attacks w/ fear of not being able to move her leg (anxiety attacks occur  3-4x month.)  . Diabetes mellitus without complication (HCC)    pre-diabetes  . H/O prior ablation treatment   . Heart murmur    palpatiations  . Hyperlipidemia   . Hypertension   . SVT (supraventricular tachycardia) (HCC)    Past Surgical History:  Procedure Laterality Date  . ABDOMINAL HYSTERECTOMY    . BREAST BIOPSY Right    neg  . cardial ablation     anxiety attack (heart palpitations HR=210 bpm) the day she was released from the hospital from surgery. Findings were neg .  Marland Kitchen COLONOSCOPY      Allergies  No Known Allergies  History of Present Illness    Felicia Barnett is a 54 y.o. female with a hx of HTN, DM2, palpitation, HLD, anxiety, chest pain, hypokalemia, OSA on CPAP, prior tobacco abuse last seen by Dr. Mariah Milling on 08/06/2019.  Noted family history of heart disease in her mother and father.  She had a previous stress test in 2015 with no ischemia.  CT chest 2017 minimal coronary calcification.  CT abdomen 2019 with minimal aortic atherosclerosis.  Seen in the ED 10/11/19 with chest pain.  Was described as being located in the center and left part of her chest and described as "tightness ".  Also endorse weakness, dizziness, elevated BP. Troponin negative x2. EKG and CXR witout acute findings.   Seen by PCP 10/16/2019 for dizziness. She was prescribed meclizine.    Labs 10/16/2019 via [facility]:  COVID negative  Hb 13.0 Hct 37.9  K 4.0, creatinine 0.7, AST 17, ALT 26, Magnesium 1.9, GFR 87  A1c 5.7 TSH 2.555  She reports palpitations.  Tells me her heart will start "jumping "when she is rest.  Self resolves after only seconds.  We discussed likely etiology of paroxysmal tachycardia/PVC.  Reassurance provided that this is not dangerous.  She tells me she has run out of her as needed propanolol-we will provide refill today.  Reports left arm muscle pain at her elbow.  Does not radiate to her wrist or up into her chest.  She has not tried any relieving factors for this.  We discussed this is not likely related to her heart.  She denies injury to the site.  She is worried arthritis.  We discussed Tylenol, ibuprofen, heat.  Encouraged her to follow-up with her PCP if this continues.  Tells me she has intermittent left-sided chest pain.  Tells me feels like a muscle ache or like a "brain freeze ".  Occurs at rest.  Does not happen every day.  Self resolves after a matter of minutes.  Endorses to be with his been having more indigestion.  Tells me 2 weeks ago she had multiple episodes of diarrhea that she thinks likely depleted her potassium which ended in the subsequent  ED visit.  We discussed that diarrhea can contribute to loss of potassium.  We also discussed that the subsequent dehydration could contribute to palpitations.  EKGs/Labs/Other Studies Reviewed:   The following studies were reviewed today:  EKG:  EKG is ordered today.  The ekg ordered today demonstrates sinus bradycardia 58 bpm with no acute ST/T wave changes.  Recent Labs: 10/11/2019: BUN 18; Creatinine, Ser 0.77; Hemoglobin 12.9; Magnesium 1.9; Platelets 241; Potassium 3.2; Sodium 139  Recent Lipid Panel No results found for: CHOL, TRIG, HDL, CHOLHDL, VLDL, LDLCALC, LDLDIRECT  Home Medications   Current Meds  Medication Sig  . Ascorbic Acid (VITAMIN C) 100 MG tablet Take 100  mg by mouth daily.  Marland Kitchen b complex vitamins capsule Take by mouth.  . chlorthalidone (HYGROTON) 25 MG tablet Take 25 mg by mouth daily.  . cholecalciferol (VITAMIN D) 25 MCG (1000 UT) tablet Take 1,000 Units by mouth daily.  . Cyanocobalamin (VITAMIN B 12 PO) Take 1 mg by mouth 2 (two) times a week.  . gabapentin (NEURONTIN) 300 MG capsule Take 300 mg by mouth daily as needed (for nerve pain).  . magnesium oxide (MAG-OX) 400 MG tablet Take 400 mg by mouth daily.   . nebivolol (BYSTOLIC) 5 MG tablet Take 5 mg by mouth 2 (two) times daily.   Marland Kitchen omeprazole (PRILOSEC) 20 MG capsule Take 20 mg by mouth daily as needed.   . Potassium Chloride ER 20 MEQ TBCR TAKE 20 MEQ BY MOUTH AS DIRECTED. TAKE 1 TABLET ONCE DAILY AND 2 TABLETS 2 DAYS A WEEK. (Patient taking differently: Take 20 mEq by mouth. Take 1 tablet once daily)  . propranolol (INDERAL) 20 MG tablet Take 1 tablet (20 mg total) by mouth 3 (three) times daily as needed. For breakthrough palpitations.  . pseudoephedrine-acetaminophen (TYLENOL SINUS) 30-500 MG TABS tablet Take 1 tablet by mouth 3 (three) times a week.  . valACYclovir (VALTREX) 500 MG tablet Take 500 mg by mouth 2 (two) times daily as needed (for flares).   . [DISCONTINUED] propranolol (INDERAL) 20 MG tablet Take 1 tablet (20 mg total) by mouth 3 (three) times daily as needed. For breakthrough palpitations.    Review of Systems      Review of Systems  Constitution: Negative for chills, fever and malaise/fatigue.  Cardiovascular: Positive for chest pain and palpitations. Negative for dyspnea on exertion, leg swelling, near-syncope, orthopnea and syncope.  Respiratory: Negative for cough, shortness of breath and wheezing.   Musculoskeletal:       Left elbow pain.  Gastrointestinal: Negative for nausea and vomiting.  Neurological: Positive for dizziness. Negative for light-headedness and weakness.  Psychiatric/Behavioral: The patient is nervous/anxious.    All other systems reviewed  and are otherwise negative except as noted above.  Physical Exam    VS:  BP 112/78 (BP Location: Left Arm, Patient Position: Sitting, Cuff Size: Normal)   Pulse (!) 58   Ht 5' (1.524 m)   Wt 174 lb (78.9 kg)   SpO2 98%   BMI 33.98 kg/m  , BMI Body mass index is 33.98 kg/m. GEN: Well nourished, well developed, in no acute distress. HEENT: normal. Neck: Supple, no JVD, carotid bruits, or masses. Cardiac: bradycardic and regular, no murmurs, rubs, or gallops. No clubbing, cyanosis, edema.  Radials/DP/PT 2+ and equal bilaterally.  Respiratory:  Respirations regular and unlabored, clear to auscultation bilaterally. GI: Soft, nontender, nondistended, BS + x 4. MS: No deformity or atrophy. L arm with full range of motion - some discomfort  with extension.  Skin: Warm and dry, no rash. Neuro:  Strength and sensation are intact. Anxious.  Psych: Normal affect.  Accessory Clinical Findings    ECG personally reviewed by me today -sinus bradycardia 50 bpm with no acute ST/T wave changes.- no acute changes.  Assessment & Plan    1. Chest pain - ED visit 10/16/19 with negative troponin. Occurs intermittently at rest on her left chest.  Feels like a "muscle ache "or a "brain freeze ".  Very atypical for angina as not associate with shortness of breath, diaphoresis does not occur with activity.  Her EKG today is without acute ST/T wave changes.  She has had previous stress tests which were normal per previous documentation. Likely etiology anxiety vs musculoskeletal.  No indication for ischemic evaluation at this time.  2. Palpitations/PVC/paroxysmal tachycardia- History of short bursts of tachycardia and PVC.  Reassurance provided that PVCs are not dangerous and will not damage her heart.  Recommend she continue her Bystolic 5 mg twice daily and utilize her propranolol as needed.  She was provided a refill of propranolol today.  Discussed that stress, caffeine can contribute to palpitations.  3. HTN  -BP well controlled today.  Continue present BP regimen with chlorthalidone 25 mg every other day, Bystolic 5 mg twice daily.  We discussed the relationship between stress and elevated blood pressure.  4. Hypokalemia -recent ED visit with K3.2.  Recheck CMP today.  Continue potassium supplementation.  Educated that GI illness including diarrhea may cause depletion her potassium.  5. HLD - Last check approx 2 years ago. Risk factors include weight, family history, pre-DM2. Lipid panel, direct LDL,CMP today.  6. L elbow pain -recommend Tylenol or ibuprofen for pain relief.  Recommend stretching daily and utilization of heat.  Recommend she follow-up with her PCP.  Disposition: Follow up in 6 month(s) with Dr. Mariah Milling or APP  Alver Sorrow, NP 10/23/2019, 11:10 AM

## 2019-10-23 NOTE — Patient Instructions (Addendum)
Medication Instructions:  No medication changes today.  *If you need a refill on your cardiac medications before your next appointment, please call your pharmacy*  Lab Work: Your physician recommends that you return for lab work today: BMET, lipid panel, direct LDL  If you have labs (blood work) drawn today and your tests are completely normal, you will receive your results only by: Marland Kitchen MyChart Message (if you have MyChart) OR . A paper copy in the mail If you have any lab test that is abnormal or we need to change your treatment, we will call you to review the results.  Testing/Procedures: You had an EKG today. It showed sinus bradycardia which is a heart rate of 58 beats per minute. This is a good result. Your heart rate is likely a little slower because of your Bystolic.  Follow-Up: At [facility], you and your health needs are our priority.  As part of our continuing mission to provide you with exceptional heart care, we have created designated Provider Care Teams.  These Care Teams include your primary Cardiologist (physician) and Advanced Practice Providers (APPs -  Physician Assistants and Nurse Practitioners) who all work together to provide you with the care you need, when you need it.  Your next appointment:   6 month(s)  The format for your next appointment:   In Person  Provider:    You may see Julien Nordmann, MD or one of the following Advanced Practice Providers on your designated Care Team:    Nicolasa Ducking, NP  Eula Listen, PA-C  Marisue Ivan, PA-C  Other Instructions  Your palpitations or fast heart rates are likely PVCs which are early beats in the bottom chamber of your heart. These are not dangerous. If you need, you can use your as-needed propranolol.  Recommend tylenol or ibuprofen and heat for your arm pain. If it continues to bother you, recommend following up with your primary care.   Recommend slowly increasing your exercise level.

## 2019-10-24 ENCOUNTER — Telehealth: Payer: Self-pay

## 2019-10-24 ENCOUNTER — Encounter: Payer: Self-pay | Admitting: Family

## 2019-10-24 DIAGNOSIS — E782 Mixed hyperlipidemia: Secondary | ICD-10-CM

## 2019-10-24 LAB — COMPREHENSIVE METABOLIC PANEL
ALT: 24 IU/L (ref 0–32)
AST: 14 IU/L (ref 0–40)
Albumin/Globulin Ratio: 1.6 (ref 1.2–2.2)
Albumin: 4.4 g/dL (ref 3.8–4.9)
Alkaline Phosphatase: 69 IU/L (ref 39–117)
BUN/Creatinine Ratio: 15 (ref 9–23)
BUN: 10 mg/dL (ref 6–24)
Bilirubin Total: 0.3 mg/dL (ref 0.0–1.2)
CO2: 27 mmol/L (ref 20–29)
Calcium: 9.8 mg/dL (ref 8.7–10.2)
Chloride: 101 mmol/L (ref 96–106)
Creatinine, Ser: 0.68 mg/dL (ref 0.57–1.00)
GFR calc Af Amer: 114 mL/min/{1.73_m2} (ref >59)
GFR calc non Af Amer: 99 mL/min/{1.73_m2} (ref >59)
Globulin, Total: 2.7 g/dL (ref 1.5–4.5)
Glucose: 89 mg/dL (ref 65–99)
Potassium: 4.1 mmol/L (ref 3.5–5.2)
Sodium: 141 mmol/L (ref 134–144)
Total Protein: 7.1 g/dL (ref 6.0–8.5)

## 2019-10-24 LAB — LIPID PANEL
Chol/HDL Ratio: 5.8 ratio — ABNORMAL HIGH (ref 0.0–4.4)
Cholesterol, Total: 236 mg/dL — ABNORMAL HIGH (ref 100–199)
HDL: 41 mg/dL (ref >39)
LDL Chol Calc (NIH): 144 mg/dL — ABNORMAL HIGH (ref 0–99)
Triglycerides: 282 mg/dL — ABNORMAL HIGH (ref 0–149)
VLDL Cholesterol Cal: 51 mg/dL — ABNORMAL HIGH (ref 5–40)

## 2019-10-24 LAB — LDL CHOLESTEROL, DIRECT: LDL Direct: 158 mg/dL — ABNORMAL HIGH (ref 0–99)

## 2019-10-24 MED ORDER — CRESTOR 5 MG PO TABS: 5.0000 mg | ORAL_TABLET | Freq: Every day | ORAL | 5 refills | Status: DC

## 2019-10-24 NOTE — Telephone Encounter (Signed)
-----   Message from Alver Sorrow, NP sent at 10/24/2019  9:57 AM EST ----- Normal kidney and liver function. Normal electrolytes. Potassium looks good at 4.1! Lipid panel shows elevated triglycerides and LDL. Elevated triglycerides related to her recently elevated A1c - continue diet changes avoiding sugar/carbs as discussed in office visit. Would like to start her on cholesterol medication to help lower these numbers. Please start Crestor 5mg  daily. Recheck liver/lipid in 6 weeks. I will send a MyChart message with info on Crestor and lipid-lowering diet.

## 2019-10-24 NOTE — Telephone Encounter (Signed)
Patient reviewed lab results and Gillian Shields, NP recommendation in [place].  Patient is agreeable with starting Crestor 5mg  daily. Patient rqst name brand Crestor only. Advised her that it may not be covered by insurance or it may be at a much higher out of pocket cost. Patient sts that she understands and rqst the Rx be sent in as she requested.  Orders are in Epic for 6 wks fasting lipid and lfts to be drawn at the . Patient is aware. Patient verbalized understanding to the info given and voiced appreciation for the call.

## 2019-10-25 NOTE — Telephone Encounter (Signed)
Patient would like to know if Vitamin D results can be seen in the blood work that she did  Patient also spoke with pharmacy yesterday evening and they did not have script for Crestor Please review and resend

## 2019-10-25 NOTE — Telephone Encounter (Signed)
Contacted [facility]. They did receive the RX for Crestor 5 mg qd DAW. [facility] sts that t was denied by insurance. Adv [facility] that the pt requested the name brand drug only and was advised that it may not be covered by insurance.  Contacted the patient and made her aware of the above. Advised the patient that she would need to pay for the Crestor out of pocket. Adv the pt that if the out of pocket cost is to high and she changes her mind and would like the generic medication she will need to contact our office so that a new Rx can be sent.  Advised the patient that a Vitamin D level was not drawn and that Cardiology does not routinely orders Vitamin D. Advised her to discuss further with her pcp.  Pt verbalized understanding and voiced appreciation for the call back.

## 2019-10-28 ENCOUNTER — Telehealth: Payer: Self-pay | Admitting: Cardiovascular Disease

## 2019-10-28 ENCOUNTER — Telehealth: Payer: Self-pay | Admitting: Family

## 2019-10-28 MED ORDER — ROSUVASTATIN CALCIUM 5 MG PO TABS: 5.0000 mg | ORAL_TABLET | Freq: Every day | ORAL | 3 refills | Status: DC

## 2019-10-28 NOTE — Telephone Encounter (Signed)
  Alternative requested not covered, please advise.   1. Which medications need to be refilled? (please list name of each medication and dose if known) Rosuvastatin calcium  2. Which pharmacy/location (including street and city if local pharmacy) is medication to [facility] sent to?[facility]  3. Do they need a 30 day or 90 day supply? 30

## 2019-10-28 NOTE — Telephone Encounter (Signed)
Pt c/o medication issue:  1. Name of Medication: crestor   2. How are you currently taking this medication (dosage and times per day)? New   3. Are you having a reaction (difficulty breathing--STAT)? No   4. What is your medication issue?  Per patient insurance will not cover this

## 2019-10-28 NOTE — Telephone Encounter (Signed)
Spoke with patient and reviewed that Crestor can be purchased at [facility] with 90 day supply for $14.32 cash price with no insurance using [facility]Rx. Also reviewed that provider recommendations for her arm pain was to see her PCP for further work up. They did discuss this at recent visit and reviewed that with her as well. She verbalized understanding of our conversation, agreement with plan, and had no further questions at this time.

## 2019-10-28 NOTE — Telephone Encounter (Signed)
Refill sent per request.   Alver Sorrow, NP

## 2019-10-28 NOTE — Telephone Encounter (Signed)
Patient also c/o L arm pain all the time .  Please advise

## 2019-11-03 ENCOUNTER — Other Ambulatory Visit: Payer: Self-pay | Admitting: Cardiovascular Disease

## 2019-11-08 ENCOUNTER — Telehealth: Payer: Self-pay | Admitting: Cardiovascular Disease

## 2019-11-08 NOTE — Telephone Encounter (Signed)
Called and talked with patient regarding her complaints with her arm /hand pain with tingling.  Pt states that the pain has been in her lt elbow for 2 months and that in the past 5 days it is in her back, lt arm, and chest.  Pt states that the pain is a pulling pain that comes and goes.  It can get worse with movement of the arm.  Pt denies nausea/vomiting, diaphoresis or shortness of breath.  Pt has an appointment on 11/12/19 at 1600. Pt was advised that if she feels like she need to be seen prior to that to go to Urgent Care or the ED. Pt states she feels like she needs her nerves in hr arm checked as well as her arteries.

## 2019-11-08 NOTE — Telephone Encounter (Signed)
Patient wants asap appt or advise for   wierd pain R side tingling in hand and finger  Scheduled 2/2 with golan at 4

## 2019-11-12 ENCOUNTER — Encounter: Payer: Self-pay | Admitting: *Deleted

## 2019-11-12 ENCOUNTER — Encounter: Payer: Self-pay | Admitting: Cardiovascular Disease

## 2019-11-12 ENCOUNTER — Other Ambulatory Visit: Payer: Self-pay

## 2019-11-12 ENCOUNTER — Ambulatory Visit (INDEPENDENT_AMBULATORY_CARE_PROVIDER_SITE_OTHER): Payer: 59 | Admitting: Cardiovascular Disease

## 2019-11-12 VITALS — BP 100/70 | HR 66 | Ht 60.0 in | Wt 175.2 lb

## 2019-11-12 DIAGNOSIS — I479 Paroxysmal tachycardia, unspecified: Secondary | ICD-10-CM | POA: Diagnosis not present

## 2019-11-12 DIAGNOSIS — I493 Ventricular premature depolarization: Secondary | ICD-10-CM

## 2019-11-12 DIAGNOSIS — R079 Chest pain, unspecified: Secondary | ICD-10-CM

## 2019-11-12 DIAGNOSIS — E782 Mixed hyperlipidemia: Secondary | ICD-10-CM | POA: Diagnosis not present

## 2019-11-12 DIAGNOSIS — I10 Essential (primary) hypertension: Secondary | ICD-10-CM

## 2019-11-12 NOTE — Patient Instructions (Addendum)

## 2019-11-12 NOTE — Progress Notes (Signed)
And that cardiology Office Note  Date:  11/12/2019   ID:  Felicia Barnett, DOB: 11/27/1965, MRN: [id number]  PCP:  Donnie Coffin, MD   Chief Complaint  Patient presents with  . other    C/o left arm pain radiates to chest and back. Meds reviewed verbally with pt.    HPI:  Felicia Barnett is a 54 y.o. female with a history of: Hypertension Diabetes Palpitations Hyperlipidemia  Anxiety  Smoker, quit 54 yo Who presents to the office today for palpitations and chest pressure.  In follow-up today, reports that she works for the county at [employer] She declined to get vaccine Daughter with covid infection, was very sick  Long hours of work, bent over, typing, using her arms Reports having pain in third finger left hand, pain in left elbow, pain radiating up her left arm left posterior shoulder and into the left pectoral region -Pain usually at rest, not with exertion Significant pain in elbow with palpation  Lab work reviewed  LDL 158 Started on crestor 5, no repeat lipids since that time HAB1C 5.6  Previous records reviewed, stress at work, prior history of chest pain, palpitations, seen in the emergency room Prior work-up negative  CT abdomen 2019 reviewed with minimal aortic atherosclerosis Prior CT chest 2017 with minimal coronary calcification  Long history of palpitations, taking Bystolic 5 twice daily. Prior Holter with PVCs  uses a CPAP, prior sleep study  showed Cheyne-Stokes breathing.  Previous stress test no ischemia  EKG personally reviewed by myself on todays visit Shows normal sinus rhythm. 66 bpm.  No significant ST-T wave changes   PMH:   has a past medical history of Anxiety, Diabetes mellitus without complication ([place]), H/O prior ablation treatment, Heart murmur, Hyperlipidemia, Hypertension, and SVT (supraventricular tachycardia) ([place]).  PSH:    Past Surgical History:  Procedure Laterality Date  . ABDOMINAL HYSTERECTOMY    . BREAST BIOPSY Right    neg   . cardial ablation     anxiety attack (heart palpitations HR=210 bpm) the day she was released from the hospital from surgery. Findings were neg .  Marland Kitchen COLONOSCOPY      Current Outpatient Medications  Medication Sig Dispense Refill  . Ascorbic Acid (VITAMIN C) 100 MG tablet Take 100 mg by mouth daily.    . chlorthalidone (HYGROTON) 25 MG tablet Take 25 mg by mouth every other day.     . cholecalciferol (VITAMIN D) 25 MCG (1000 UT) tablet Take 1,000 Units by mouth daily.    . Cyanocobalamin (VITAMIN B 12 PO) Take 1 mg by mouth every other day.     . famotidine (PEPCID) 40 MG tablet Take 40 mg by mouth daily.    Marland Kitchen gabapentin (NEURONTIN) 300 MG capsule Take 300 mg by mouth daily as needed (for nerve pain).    . magnesium oxide (MAG-OX) 400 MG tablet Take 400 mg by mouth daily.     . Multiple Vitamins-Minerals (ZINC PO) Take by mouth every other day.    . nebivolol (BYSTOLIC) 5 MG tablet Take 5 mg by mouth 2 (two) times daily.     . NON FORMULARY Liver Detox 1 capsule qd.    . NON FORMULARY Blood sugar support Takes 1 capsule qd.    . Potassium Chloride ER 20 MEQ TBCR TAKE 1 TABLET BY MOUTH AS DIRECTED. TAKE 1 TABLET ONCE DAILY AND 2 TABLETS 2 DAYS A WEEK. 35 tablet 6  . propranolol (INDERAL) 20 MG tablet Take 1 tablet (  20 mg total) by mouth 3 (three) times daily as needed. For breakthrough palpitations. 90 tablet 3  . rosuvastatin (CRESTOR) 5 MG tablet Take 1 tablet (5 mg total) by mouth at bedtime. 90 tablet 3  . valACYclovir (VALTREX) 500 MG tablet Take 500 mg by mouth 2 (two) times daily as needed (for flares).      No current facility-administered medications for this visit.    ALLERGIES:   Patient has no known allergies.   SOCIAL HISTORY:  The patient  reports that she quit smoking about 10 years ago. She quit after 20.00 years of use. She has never used smokeless tobacco. She reports current alcohol use. She reports that she does not use drugs.   FAMILY HISTORY:   family history  includes Diabetes in her father and mother; Heart attack in her father; Heart disease in her mother; Hypertension in her father and mother.    REVIEW OF SYSTEMS: Review of Systems  Constitutional: Negative.   HENT: Negative.   Eyes: Negative.   Respiratory: Negative.   Cardiovascular: Negative.  Negative for leg swelling.  Gastrointestinal: Negative.   Genitourinary: Negative.   Musculoskeletal: Negative.        Left arm pain, left elbow pain, left middle finger pain  Neurological: Negative.   Psychiatric/Behavioral: The patient is nervous/anxious.   All other systems reviewed and are negative.   PHYSICAL EXAM: VS:  BP 100/70 (BP Location: Left Arm, Patient Position: Sitting, Cuff Size: Normal)   Pulse 66   Ht 5' (1.524 m)   Wt 175 lb 4 oz (79.5 kg)   SpO2 98%   BMI 34.23 kg/m  , BMI Body mass index is 34.23 kg/m.  Constitutional:  oriented to person, place, and time. No distress.  HENT:  Head: Grossly normal Eyes:  no discharge. No scleral icterus.  Neck: No JVD, no carotid bruits  Cardiovascular: Regular rate and rhythm, no murmurs appreciated Pulmonary/Chest: Clear to auscultation bilaterally, no wheezes or rails Abdominal: Soft.  no distension.  no tenderness.  Musculoskeletal: Normal range of motion Neurological:  normal muscle tone. Coordination normal. No atrophy Skin: Skin warm and dry Psychiatric: normal affect, pleasant  RECENT LABS: 10/11/2019: Hemoglobin 12.9; Magnesium 1.9; Platelets 241 10/23/2019: ALT 24; BUN 10; Creatinine, Ser 0.68; Potassium 4.1; Sodium 141    LIPID PANEL: Lab Results  Component Value Date   CHOL 236 (H) 10/23/2019   HDL 41 10/23/2019   LDLCALC 144 (H) 10/23/2019   TRIG 282 (H) 10/23/2019      WEIGHT: Wt Readings from Last 3 Encounters:  11/12/19 175 lb 4 oz (79.5 kg)  10/23/19 174 lb (78.9 kg)  10/11/19 174 lb (78.9 kg)     ASSESSMENT AND PLAN:  Paroxysmal tachycardia (HCC) Symptoms well controlled on today's visit,  no medication changes made  Chest pain, unspecified type Atypical in nature, arm pain, elbow pain, finger pain on the left Suspect secondary to sitting bent over for hours at work typing Most likely muscular ligamental Recommended NSAIDs, icing, change in position We have written her a note to get a different chair at work which is hopefully more ergonomically appropriate  Palpitations - Prior history of short runs of tachycardia and PVCs Previously felt to be stress  Essential hypertension Blood pressure is well controlled on today's visit. No changes made to the medications.  Low potassium syndrome Previously felt to diuretics  Hyperlipidemia On Crestor 5 daily Very minimal aortic atherosclerosis noted on CT  Disposition:   F/U as needed  Total encounter time more than 25 minutes. Greater than 50% was spent in counseling and coordination of care with the patient.    Signed, Dossie Arbour, M.D., Ph.D. 11/12/2019  [facility] [place], [place] [phone]

## 2020-03-05 ENCOUNTER — Encounter: Payer: Self-pay | Admitting: Emergency Medicine

## 2020-03-05 ENCOUNTER — Emergency Department: Payer: 59

## 2020-03-05 ENCOUNTER — Emergency Department
Admission: EM | Admit: 2020-03-05 | Discharge: 2020-03-05 | Disposition: A | Discharge status: Home / Self Care | Payer: 59 | Attending: Emergency Medicine | Admitting: Emergency Medicine

## 2020-03-05 ENCOUNTER — Other Ambulatory Visit: Payer: Self-pay

## 2020-03-05 DIAGNOSIS — K2970 Gastritis, unspecified, without bleeding: Secondary | ICD-10-CM | POA: Diagnosis not present

## 2020-03-05 DIAGNOSIS — I10 Essential (primary) hypertension: Secondary | ICD-10-CM | POA: Diagnosis not present

## 2020-03-05 DIAGNOSIS — E119 Type 2 diabetes mellitus without complications: Secondary | ICD-10-CM | POA: Diagnosis not present

## 2020-03-05 DIAGNOSIS — R079 Chest pain, unspecified: Secondary | ICD-10-CM

## 2020-03-05 DIAGNOSIS — Z79899 Other long term (current) drug therapy: Secondary | ICD-10-CM | POA: Diagnosis not present

## 2020-03-05 DIAGNOSIS — Z87891 Personal history of nicotine dependence: Secondary | ICD-10-CM | POA: Insufficient documentation

## 2020-03-05 LAB — COMPREHENSIVE METABOLIC PANEL
ALT: 27 U/L (ref 0–44)
AST: 21 U/L (ref 15–41)
Albumin: 4.4 g/dL (ref 3.5–5.0)
Alkaline Phosphatase: 60 U/L (ref 38–126)
Anion gap: 8 (ref 5–15)
BUN: 20 mg/dL (ref 6–20)
CO2: 25 mmol/L (ref 22–32)
Calcium: 9.3 mg/dL (ref 8.9–10.3)
Chloride: 106 mmol/L (ref 98–111)
Creatinine, Ser: 0.66 mg/dL (ref 0.44–1.00)
GFR calc Af Amer: >60 mL/min (ref >60)
GFR calc non Af Amer: >60 mL/min (ref >60)
Glucose, Bld: 111 mg/dL — ABNORMAL HIGH (ref 70–99)
Potassium: 3.7 mmol/L (ref 3.5–5.1)
Sodium: 139 mmol/L (ref 135–145)
Total Bilirubin: 0.8 mg/dL (ref 0.3–1.2)
Total Protein: 7.7 g/dL (ref 6.5–8.1)

## 2020-03-05 LAB — CBC WITH DIFFERENTIAL/PLATELET
Abs Immature Granulocytes: 0.02 10*3/uL (ref 0.00–0.07)
Basophils Absolute: 0 10*3/uL (ref 0.0–0.1)
Basophils Relative: 0 %
Eosinophils Absolute: 0.1 10*3/uL (ref 0.0–0.5)
Eosinophils Relative: 2 %
HCT: 37.2 % (ref 36.0–46.0)
Hemoglobin: 13.1 g/dL (ref 12.0–15.0)
Immature Granulocytes: 0 %
Lymphocytes Relative: 53 %
Lymphs Abs: 3 10*3/uL (ref 0.7–4.0)
MCH: 30.2 pg (ref 26.0–34.0)
MCHC: 35.2 g/dL (ref 30.0–36.0)
MCV: 85.7 fL (ref 80.0–100.0)
Monocytes Absolute: 0.6 10*3/uL (ref 0.1–1.0)
Monocytes Relative: 10 %
Neutro Abs: 2 10*3/uL (ref 1.7–7.7)
Neutrophils Relative %: 35 %
Platelets: 238 10*3/uL (ref 150–400)
RBC: 4.34 MIL/uL (ref 3.87–5.11)
RDW: 12.4 % (ref 11.5–15.5)
WBC: 5.7 10*3/uL (ref 4.0–10.5)
nRBC: 0 % (ref 0.0–0.2)

## 2020-03-05 LAB — URINALYSIS, COMPLETE (UACMP) WITH MICROSCOPIC
Bacteria, UA: NONE SEEN
Bilirubin Urine: NEGATIVE
Glucose, UA: NEGATIVE mg/dL
Hgb urine dipstick: NEGATIVE
Ketones, ur: NEGATIVE mg/dL
Leukocytes,Ua: NEGATIVE
Nitrite: NEGATIVE
Protein, ur: NEGATIVE mg/dL
Specific Gravity, Urine: 1.019 (ref 1.005–1.030)
pH: 5 (ref 5.0–8.0)

## 2020-03-05 LAB — TROPONIN I (HIGH SENSITIVITY)
Troponin I (High Sensitivity): 5 ng/L (ref <18)
Troponin I (High Sensitivity): 5 ng/L (ref <18)

## 2020-03-05 LAB — MAGNESIUM: Magnesium: 2.1 mg/dL (ref 1.7–2.4)

## 2020-03-05 LAB — LIPASE, BLOOD: Lipase: 36 U/L (ref 11–51)

## 2020-03-05 MED ORDER — SUCRALFATE 1 G PO TABS
1.0000 g | ORAL_TABLET | Freq: Once | ORAL | Status: AC
  Administered 2020-03-05: 1 g via ORAL
  Filled 2020-03-05: qty 1

## 2020-03-05 MED ORDER — SUCRALFATE 1 G PO TABS
1.0000 g | ORAL_TABLET | Freq: Four times a day (QID) | ORAL | 1 refills | Status: DC
Start: 2020-03-05 — End: 2020-05-07

## 2020-03-05 MED ORDER — FAMOTIDINE 40 MG PO TABS: 40.0000 mg | ORAL_TABLET | Freq: Two times a day (BID) | ORAL | 0 refills | Status: DC

## 2020-03-05 MED ORDER — MAGNESIUM OXIDE 400 MG PO TABS: 400.0000 mg | ORAL_TABLET | Freq: Every day | ORAL | 0 refills | Status: AC

## 2020-03-05 MED ORDER — FAMOTIDINE 20 MG PO TABS
40.0000 mg | ORAL_TABLET | Freq: Once | ORAL | Status: AC
  Administered 2020-03-05: 40 mg via ORAL
  Filled 2020-03-05: qty 2

## 2020-03-05 NOTE — Discharge Instructions (Addendum)
Your EKG, Chest xray, and lab tests today were all normal. Please follow up with your doctor for further evaluation of your symptoms, which seem to be due to a stomach problem. Keep taking carafate and famotidine, which will soothe your stomach.

## 2020-03-05 NOTE — ED Triage Notes (Signed)
Patient ambulatory to triage with steady gait, without difficulty or distress noted, mask in place; pt reports left upper CP accomp by diarrhea and mid abd pain; st hx card ablation

## 2020-03-05 NOTE — ED Provider Notes (Signed)
[facility] Emergency Department Provider Note  ____________________________________________  Time seen: Approximately 9:51 AM  I have reviewed the triage vital signs and the nursing notes.   HISTORY  Chief Complaint Chest Pain    HPI Felicia Barnett is a 54 y.o. female with a history of anxiety, diabetes, hypertension, hyperlipidemia, low potassium who complains of pain that started at 10 PM last night, initially felt in the epigastrium, later migrated to the left mid chest.  Nonradiating, no shortness of breath vomiting or diaphoresis.  Not exertional, not pleuritic.  Onset while at rest laying down at night.  Continuous, no aggravating or alleviating factors throughout the night.  Was persistent until about 4:30 AM and then the patient decided to come to the ED for evaluation.  She reports that it now is feeling better after sitting upright for a period of time.  She notes that over the past several months she has been getting abdominal bloating and generalized discomfort after eating.  No right upper quadrant pain or shoulder pain.  No vomiting.  No fever.  She notes that her husband was recently diagnosed with H. pylori.   Patient is concerned that after drinking 2 cups of green tea yesterday, she had an episode of loose bowel movement.   Past Medical History:  Diagnosis Date  . Anxiety    since surgery, pt has had attacks w/ fear of not being able to move her leg (anxiety attacks occur  3-4x month.)  . Diabetes mellitus without complication (HCC)    pre-diabetes  . H/O prior ablation treatment   . Heart murmur    palpatiations  . Hyperlipidemia   . Hypertension   . SVT (supraventricular tachycardia) [facility])      Patient Active Problem List   Diagnosis Date Noted  . Paroxysmal tachycardia (HCC) 12/24/2018  . Chest pain 12/24/2018  . Palpitations 12/24/2018  . Essential hypertension 12/24/2018  . Low potassium syndrome 12/24/2018  . PVC's  (premature ventricular contractions) 12/24/2018  . Other fatigue 12/24/2018     Past Surgical History:  Procedure Laterality Date  . ABDOMINAL HYSTERECTOMY    . BREAST BIOPSY Right    neg  . cardial ablation     anxiety attack (heart palpitations HR=210 bpm) the day she was released from the hospital from surgery. Findings were neg .  Marland Kitchen COLONOSCOPY       Prior to Admission medications   Medication Sig Start Date End Date Taking? Authorizing Provider  Ascorbic Acid (VITAMIN C) 100 MG tablet Take 100 mg by mouth daily.    [provider]  chlorthalidone (HYGROTON) 25 MG tablet Take 25 mg by mouth every other day.     [provider]  cholecalciferol (VITAMIN D) 25 MCG (1000 UT) tablet Take 1,000 Units by mouth daily.    [provider]  Cyanocobalamin (VITAMIN B 12 PO) Take 1 mg by mouth every other day.     [provider]  famotidine (PEPCID) 40 MG tablet Take 1 tablet (40 mg total) by mouth 2 (two) times daily. 03/05/20 04/04/20  Sharman Cheek, MD  gabapentin (NEURONTIN) 300 MG capsule Take 300 mg by mouth daily as needed (for nerve pain).    [provider]  magnesium oxide (MAG-OX) 400 MG tablet Take 1 tablet (400 mg total) by mouth daily. 03/05/20 04/04/20  Sharman Cheek, MD  Multiple Vitamins-Minerals (ZINC PO) Take by mouth every other day.    [provider]  nebivolol (BYSTOLIC) 5 MG  tablet Take 5 mg by mouth 2 (two) times daily.     [provider]  NON FORMULARY Liver Detox 1 capsule qd.    [provider]  NON FORMULARY Blood sugar support Takes 1 capsule qd.    [provider]  Potassium Chloride ER 20 MEQ TBCR TAKE 1 TABLET BY MOUTH AS DIRECTED. TAKE 1 TABLET ONCE DAILY AND 2 TABLETS 2 DAYS A WEEK. 11/04/19   Gollan, Tollie Pizza, MD  propranolol (INDERAL) 20 MG tablet Take 1 tablet (20 mg total) by mouth 3 (three) times daily as needed. For breakthrough palpitations. 10/23/19   Alver Sorrow, NP  rosuvastatin (CRESTOR) 5 MG tablet Take 1 tablet (5 mg total) by mouth at bedtime. 10/28/19   Alver Sorrow, NP  sucralfate (CARAFATE) 1 g tablet Take 1 tablet (1 g total) by mouth 4 (four) times daily. 03/05/20   Sharman Cheek, MD  valACYclovir (VALTREX) 500 MG tablet Take 500 mg by mouth 2 (two) times daily as needed (for flares).     [provider]     Allergies Patient has no known allergies.   Family History  Problem Relation Age of Onset  . Heart disease Mother   . Diabetes Mother   . Hypertension Mother   . Heart attack Father   . Diabetes Father   . Hypertension Father   . Breast cancer Neg Hx     Social History Social History   Tobacco Use  . Smoking status: Former Smoker    Years: 20.00    Quit date: 03/02/2009    Years since quitting: 11.0  . Smokeless tobacco: Never Used  Substance Use Topics  . Alcohol use: Yes    Comment: occ  . Drug use: No    Review of Systems  Constitutional:   No fever or chills.  ENT:   No sore throat. No rhinorrhea. Cardiovascular: Positive chest pain as above without syncope. Respiratory:   No dyspnea or cough. Gastrointestinal:   Negative for abdominal pain, vomiting and diarrhea.  Musculoskeletal:   Negative for focal pain or swelling All other systems reviewed and are negative except as documented above in ROS and HPI.  ____________________________________________   PHYSICAL EXAM:  VITAL SIGNS: ED Triage Vitals  Enc Vitals Group     BP 03/05/20 0504 (!) 142/76     Pulse Rate 03/05/20 0504 68     Resp 03/05/20 0504 16     Temp 03/05/20 0504 98.2 F (36.8 C)     Temp Source 03/05/20 0504 Oral     SpO2 03/05/20 0504 96 %     Weight 03/05/20 0505 176 lb (79.8 kg)     Height 03/05/20 0505 5' (1.524 m)     Head Circumference --      Peak Flow --      Pain Score 03/05/20 0509 6     Pain Loc --      Pain Edu? --      Excl. in GC? --     Vital signs reviewed, nursing assessments  reviewed.   Constitutional:   Alert and oriented. Non-toxic appearance. Eyes:   Conjunctivae are normal. EOMI. PERRL. ENT      Head:   Normocephalic and atraumatic.      Nose:   Wearing a mask.      Mouth/Throat:   Wearing a mask.      Neck:   No meningismus. Full ROM. Hematological/Lymphatic/Immunilogical:   No cervical lymphadenopathy. Cardiovascular:  RRR. Symmetric bilateral radial and DP pulses.  No murmurs. Cap refill less than 2 seconds. Respiratory:   Normal respiratory effort without tachypnea/retractions. Breath sounds are clear and equal bilaterally. No wheezes/rales/rhonchi. Gastrointestinal:   Soft with left upper quadrant tenderness. Non distended. There is no CVA tenderness.  No rebound, rigidity, or guarding. Musculoskeletal:   Normal range of motion in all extremities. No joint effusions.  No lower extremity tenderness.  No edema. Neurologic:   Normal speech and language.  Motor grossly intact. No acute focal neurologic deficits are appreciated.  Skin:    Skin is warm, dry and intact. No rash noted.  No petechiae, purpura, or bullae.  ____________________________________________    LABS (pertinent positives/negatives) (all labs ordered are listed, but only abnormal results are displayed) Labs Reviewed  COMPREHENSIVE METABOLIC PANEL - Abnormal; Notable for the following components:      Result Value   Glucose, Bld 111 (*)    All other components within normal limits  URINALYSIS, COMPLETE (UACMP) WITH MICROSCOPIC - Abnormal; Notable for the following components:   Color, Urine YELLOW (*)    APPearance CLEAR (*)    All other components within normal limits  CBC WITH DIFFERENTIAL/PLATELET  LIPASE, BLOOD  MAGNESIUM  TROPONIN I (HIGH SENSITIVITY)  TROPONIN I (HIGH SENSITIVITY)   ____________________________________________   EKG  Interpreted by me  Date: 03/05/2020  Rate: 70  Rhythm: normal sinus rhythm  QRS Axis: normal  Intervals: normal  ST/T Wave  abnormalities: normal  Conduction Disutrbances: none  Narrative Interpretation: unremarkable      ____________________________________________    RADIOLOGY  DG Chest 2 View  Result Date: 03/05/2020 CLINICAL DATA:  Chest pain, diarrhea and mid abdominal pain EXAM: CHEST - 2 VIEW COMPARISON:  Radiograph 10/11/2019, CT 10/07/2013 FINDINGS: No consolidation, features of edema, pneumothorax, or effusion. Pulmonary vascularity is normally distributed. The cardiomediastinal contours are unremarkable. No acute osseous or soft tissue abnormality. IMPRESSION: No acute cardiopulmonary abnormality. Electronically Signed   By: Lovena Le M.D.   On: 03/05/2020 05:25    ____________________________________________   PROCEDURES Procedures  ____________________________________________  DIFFERENTIAL DIAGNOSIS   Gastritis, GERD, non-STEMI, anxiety, pancreatitis, choledocholithiasis, pneumonia  CLINICAL IMPRESSION / ASSESSMENT AND PLAN / ED COURSE  Medications ordered in the ED: Medications  sucralfate (CARAFATE) tablet 1 g (1 g Oral Given 03/05/20 0949)  famotidine (PEPCID) tablet 40 mg (40 mg Oral Given 03/05/20 0949)    Pertinent labs & imaging results that were available during my care of the patient were reviewed by me and considered in my medical decision making (see chart for details).  JAMIKA SADEK was evaluated in Emergency Department on 03/05/2020 for the symptoms described in the history of present illness. She was evaluated in the context of the global COVID-19 pandemic, which necessitated consideration that the patient might be at risk for infection with the SARS-CoV-2 virus that causes COVID-19. Institutional protocols and algorithms that pertain to the evaluation of patients at risk for COVID-19 are in a state of rapid change based on information released by regulatory bodies including the CDC and federal and state organizations. These policies and algorithms were followed  during the patient's care in the ED.   Patient presents with epigastric pain migrating to the left chest overnight.  Atypical in nature, noncardiac, but with age and comorbidities, serial troponins were obtained.  EKG is normal, chest x-ray and labs were all normal.   Considering the patient's symptoms, medical history, and physical examination today, I have low suspicion for  ACS, PE, TAD, pneumothorax, carditis, mediastinitis, pneumonia, CHF, or sepsis.  Based on exam and history I suspect gastritis, possibly H. pylori related or due to irritant from potassium and magnesium supplements that she takes and having 2 cups of green tea yesterday.      ____________________________________________   FINAL CLINICAL IMPRESSION(S) / ED DIAGNOSES    Final diagnoses:  Gastritis without bleeding, unspecified chronicity, unspecified gastritis type  Nonspecific chest pain     ED Discharge Orders         Ordered    famotidine (PEPCID) 40 MG tablet  2 times daily     03/05/20 0951    sucralfate (CARAFATE) 1 g tablet  4 times daily     03/05/20 0951    magnesium oxide (MAG-OX) 400 MG tablet  Daily     03/05/20 1000          Portions of this note were generated with dragon dictation software. Dictation errors may occur despite best attempts at proofreading.   Sharman Cheek, MD 03/05/20 1002

## 2020-03-24 ENCOUNTER — Other Ambulatory Visit: Payer: Self-pay | Admitting: Primary Care

## 2020-03-24 DIAGNOSIS — R1084 Generalized abdominal pain: Secondary | ICD-10-CM

## 2020-04-02 ENCOUNTER — Encounter: Payer: Self-pay | Admitting: *Deleted

## 2020-04-09 ENCOUNTER — Other Ambulatory Visit: Payer: Self-pay

## 2020-04-16 ENCOUNTER — Telehealth: Payer: Self-pay | Admitting: Cardiovascular Disease

## 2020-04-16 NOTE — Telephone Encounter (Signed)
Attempted to schedule no ans no vm  

## 2020-04-16 NOTE — Telephone Encounter (Signed)
-----   Message from Kendrick Fries, [place] sent at 04/15/2020  2:38 PM EDT ----- Pt has upcoming appointment for 6 months. Pt last seen 11/2019 told to f/u prn. I'm not sure if pt called and was having issues or if pt needs to be rescheduled. Please advise.

## 2020-04-26 NOTE — Progress Notes (Deleted)
And that cardiology Office Note  Date:  04/26/2020   ID:  Lesia Hausen, DOB: 04-09-1966, MRN: [id number]  PCP:  Emogene Morgan, MD   No chief complaint on file.   HPI:  Felicia Barnett is a 54 y.o. female with a history of: Hypertension Diabetes Palpitations Hyperlipidemia  Anxiety  Smoker, quit 54 yo Who presents to the office today for palpitations and chest pressure.  Last seen in clinic February 2021 Was having atypical arm elbow finger pain on the left felt secondary to bending over for hours at work, muscular ligamental injury    In follow-up today, reports that she works for [employer] at [employer] She declined to get vaccine Daughter with covid infection, was very sick  Long hours of work, bent over, typing, using her arms Reports having pain in third finger left hand, pain in left elbow, pain radiating up her left arm left posterior shoulder and into the left pectoral region -Pain usually at rest, not with exertion Significant pain in elbow with palpation  Lab work reviewed  LDL 158 Started on crestor 5, no repeat lipids since that time HAB1C 5.6  Previous records reviewed, stress at work, prior history of chest pain, palpitations, seen in the emergency room Prior work-up negative  CT abdomen 2019 reviewed with minimal aortic atherosclerosis Prior CT chest 2017 with minimal coronary calcification  Long history of palpitations, taking Bystolic 5 twice daily. Prior Holter with PVCs  uses a CPAP, prior sleep study  showed Cheyne-Stokes breathing.  Previous stress test no ischemia  EKG personally reviewed by myself on todays visit Shows normal sinus rhythm. 66 bpm.  No significant ST-T wave changes   PMH:   has a past medical history of Anxiety, Diabetes mellitus without complication (HCC), H/O prior ablation treatment, Heart murmur, Hyperlipidemia, Hypertension, and SVT (supraventricular tachycardia) (HCC).  PSH:    Past Surgical History:  Procedure  Laterality Date  . ABDOMINAL HYSTERECTOMY    . BREAST BIOPSY Right    neg  . cardial ablation     anxiety attack (heart palpitations HR=210 bpm) the day she was released from the hospital from surgery. Findings were neg .  Marland Kitchen COLONOSCOPY      Current Outpatient Medications  Medication Sig Dispense Refill  . Ascorbic Acid (VITAMIN C) 100 MG tablet Take 100 mg by mouth daily.    . chlorthalidone (HYGROTON) 25 MG tablet Take 25 mg by mouth every other day.     . cholecalciferol (VITAMIN D) 25 MCG (1000 UT) tablet Take 1,000 Units by mouth daily.    . Cyanocobalamin (VITAMIN B 12 PO) Take 1 mg by mouth every other day.     . famotidine (PEPCID) 40 MG tablet Take 1 tablet (40 mg total) by mouth 2 (two) times daily. 60 tablet 0  . gabapentin (NEURONTIN) 300 MG capsule Take 300 mg by mouth daily as needed (for nerve pain).    . Multiple Vitamins-Minerals (ZINC PO) Take by mouth every other day.    . nebivolol (BYSTOLIC) 5 MG tablet Take 5 mg by mouth 2 (two) times daily.     . NON FORMULARY Liver Detox 1 capsule qd.    . NON FORMULARY Blood sugar support Takes 1 capsule qd.    . Potassium Chloride ER 20 MEQ TBCR TAKE 1 TABLET BY MOUTH AS DIRECTED. TAKE 1 TABLET ONCE DAILY AND 2 TABLETS 2 DAYS A WEEK. 35 tablet 6  . propranolol (INDERAL) 20 MG tablet Take 1 tablet (20  mg total) by mouth 3 (three) times daily as needed. For breakthrough palpitations. 90 tablet 3  . rosuvastatin (CRESTOR) 5 MG tablet Take 1 tablet (5 mg total) by mouth at bedtime. 90 tablet 3  . sucralfate (CARAFATE) 1 g tablet Take 1 tablet (1 g total) by mouth 4 (four) times daily. 120 tablet 1  . valACYclovir (VALTREX) 500 MG tablet Take 500 mg by mouth 2 (two) times daily as needed (for flares).      No current facility-administered medications for this visit.    ALLERGIES:   Patient has no known allergies.   SOCIAL HISTORY:  The patient  reports that she quit smoking about 11 years ago. She quit after 20.00 years of use.  She has never used smokeless tobacco. She reports current alcohol use. She reports that she does not use drugs.   FAMILY HISTORY:   family history includes Diabetes in her father and mother; Heart attack in her father; Heart disease in her mother; Hypertension in her father and mother.    REVIEW OF SYSTEMS: Review of Systems  Constitutional: Negative.   HENT: Negative.   Eyes: Negative.   Respiratory: Negative.   Cardiovascular: Negative.  Negative for leg swelling.  Gastrointestinal: Negative.   Genitourinary: Negative.   Musculoskeletal: Negative.        Left arm pain, left elbow pain, left middle finger pain  Neurological: Negative.   Psychiatric/Behavioral: The patient is nervous/anxious.   All other systems reviewed and are negative.   PHYSICAL EXAM: VS:  There were no vitals taken for this visit. , BMI There is no height or weight on file to calculate BMI.  Constitutional:  oriented to person, place, and time. No distress.  HENT:  Head: Grossly normal Eyes:  no discharge. No scleral icterus.  Neck: No JVD, no carotid bruits  Cardiovascular: Regular rate and rhythm, no murmurs appreciated Pulmonary/Chest: Clear to auscultation bilaterally, no wheezes or rails Abdominal: Soft.  no distension.  no tenderness.  Musculoskeletal: Normal range of motion Neurological:  normal muscle tone. Coordination normal. No atrophy Skin: Skin warm and dry Psychiatric: normal affect, pleasant  RECENT LABS: 03/05/2020: ALT 27; BUN 20; Creatinine, Ser 0.66; Hemoglobin 13.1; Magnesium 2.1; Platelets 238; Potassium 3.7; Sodium 139    LIPID PANEL: Lab Results  Component Value Date   CHOL 236 (H) 10/23/2019   HDL 41 10/23/2019   LDLCALC 144 (H) 10/23/2019   TRIG 282 (H) 10/23/2019      WEIGHT: Wt Readings from Last 3 Encounters:  03/05/20 176 lb (79.8 kg)  11/12/19 175 lb 4 oz (79.5 kg)  10/23/19 174 lb (78.9 kg)     ASSESSMENT AND PLAN:  Paroxysmal tachycardia (HCC) Symptoms  well controlled on today's visit, no medication changes made  Chest pain, unspecified type Atypical in nature, arm pain, elbow pain, finger pain on the left Suspect secondary to sitting bent over for hours at work typing Most likely muscular ligamental Recommended NSAIDs, icing, change in position We have written her a note to get a different chair at work which is hopefully more ergonomically appropriate  Palpitations - Prior history of short runs of tachycardia and PVCs Previously felt to be stress  Essential hypertension Blood pressure is well controlled on today's visit. No changes made to the medications.  Low potassium syndrome Previously felt to diuretics  Hyperlipidemia On Crestor 5 daily Very minimal aortic atherosclerosis noted on CT  Disposition:   F/U as needed  Total encounter time more than 25 minutes.  Greater than 50% was spent in counseling and coordination of care with the patient.    Signed, Dossie Arbour, M.D., Ph.D. 04/26/2020  [facility] [place], [place] [phone]

## 2020-04-27 ENCOUNTER — Ambulatory Visit: Payer: 59 | Admitting: Cardiovascular Disease

## 2020-04-28 ENCOUNTER — Encounter: Payer: Self-pay | Admitting: Cardiovascular Disease

## 2020-05-05 ENCOUNTER — Emergency Department: Payer: 59

## 2020-05-05 ENCOUNTER — Telehealth: Payer: Self-pay | Admitting: Cardiovascular Disease

## 2020-05-05 ENCOUNTER — Encounter: Payer: Self-pay | Admitting: Emergency Medicine

## 2020-05-05 ENCOUNTER — Other Ambulatory Visit: Payer: Self-pay

## 2020-05-05 ENCOUNTER — Emergency Department
Admission: EM | Admit: 2020-05-05 | Discharge: 2020-05-05 | Disposition: A | Discharge status: Home / Self Care | Payer: 59 | Attending: Emergency Medicine | Admitting: Emergency Medicine

## 2020-05-05 DIAGNOSIS — R000 Tachycardia, unspecified: Secondary | ICD-10-CM | POA: Insufficient documentation

## 2020-05-05 DIAGNOSIS — R0789 Other chest pain: Secondary | ICD-10-CM | POA: Diagnosis not present

## 2020-05-05 DIAGNOSIS — Z5321 Procedure and treatment not carried out due to patient leaving prior to being seen by health care provider: Secondary | ICD-10-CM | POA: Diagnosis not present

## 2020-05-05 LAB — COMPREHENSIVE METABOLIC PANEL
ALT: 25 U/L (ref 0–44)
AST: 18 U/L (ref 15–41)
Albumin: 4.4 g/dL (ref 3.5–5.0)
Alkaline Phosphatase: 60 U/L (ref 38–126)
Anion gap: 9 (ref 5–15)
BUN: 14 mg/dL (ref 6–20)
CO2: 29 mmol/L (ref 22–32)
Calcium: 9.2 mg/dL (ref 8.9–10.3)
Chloride: 102 mmol/L (ref 98–111)
Creatinine, Ser: 0.77 mg/dL (ref 0.44–1.00)
GFR calc Af Amer: >60 mL/min (ref >60)
GFR calc non Af Amer: >60 mL/min (ref >60)
Glucose, Bld: 106 mg/dL — ABNORMAL HIGH (ref 70–99)
Potassium: 4 mmol/L (ref 3.5–5.1)
Sodium: 140 mmol/L (ref 135–145)
Total Bilirubin: 0.6 mg/dL (ref 0.3–1.2)
Total Protein: 7.5 g/dL (ref 6.5–8.1)

## 2020-05-05 LAB — CBC WITH DIFFERENTIAL/PLATELET
Abs Immature Granulocytes: 0.02 10*3/uL (ref 0.00–0.07)
Basophils Absolute: 0 10*3/uL (ref 0.0–0.1)
Basophils Relative: 1 %
Eosinophils Absolute: 0.1 10*3/uL (ref 0.0–0.5)
Eosinophils Relative: 2 %
HCT: 37.1 % (ref 36.0–46.0)
Hemoglobin: 12.6 g/dL (ref 12.0–15.0)
Immature Granulocytes: 0 %
Lymphocytes Relative: 47 %
Lymphs Abs: 3.4 10*3/uL (ref 0.7–4.0)
MCH: 30.3 pg (ref 26.0–34.0)
MCHC: 34 g/dL (ref 30.0–36.0)
MCV: 89.2 fL (ref 80.0–100.0)
Monocytes Absolute: 0.7 10*3/uL (ref 0.1–1.0)
Monocytes Relative: 10 %
Neutro Abs: 2.8 10*3/uL (ref 1.7–7.7)
Neutrophils Relative %: 40 %
Platelets: 227 10*3/uL (ref 150–400)
RBC: 4.16 MIL/uL (ref 3.87–5.11)
RDW: 12.7 % (ref 11.5–15.5)
WBC: 7 10*3/uL (ref 4.0–10.5)
nRBC: 0 % (ref 0.0–0.2)

## 2020-05-05 LAB — TROPONIN I (HIGH SENSITIVITY)
Troponin I (High Sensitivity): 4 ng/L (ref <18)
Troponin I (High Sensitivity): 5 ng/L (ref <18)

## 2020-05-05 NOTE — Telephone Encounter (Signed)
Spoke with the patient. Patient was seen in the ED today. She left before being seen by a provider due to the long wait.  Patient sts that she had an episode of racing heart associated with some chest tightness that occurred 3 days ago. She has not had any reoccurrence since.   Patient sts that she is currently asymptomatic. Patients ED work up (EKG, Cxr, troponin x2, bmet, cbc) were are unremarkable.  Patient is scheduled for ab appt with Gillian Shields, NP on 05/07/20. Adv the patient that she is to return to the ED if symptoms reoccur in the interim.  Patient agreeable and voiced appreciation for the call.

## 2020-05-05 NOTE — Telephone Encounter (Signed)
Patient c/o Palpitations:  High priority if patient c/o lightheadedness, shortness of breath, or chest pain  1) How long have you had palpitations/irregular HR/ Afib? Are you having the symptoms now? 3 days ago. Usually at night when resting  2) Are you currently experiencing lightheadedness, SOB or CP? Chest pain on left when palpations occur, jumping heart  3) Do you have a history of afib (atrial fibrillation) or irregular heart rhythm? yes  4) Have you checked your BP or HR? (document readings if available): 152/87 HR was 102 then 60 in ED last night  5) Are you experiencing any other symptoms? n/a  Patient went to ED and had blood work done, EKG and x ray. Patient was not able to wait the 7 hours she was estimated so she left  Patient has been scheduled for Thursday with [facility]  Please advise

## 2020-05-05 NOTE — ED Triage Notes (Signed)
Patient ambulatory to triage with steady gait, without difficulty or distress noted; pt reports PTA had tachycardia that lasted ; now reports "pressure" to left side of chest with no accomp symptoms; hx card ablation 68yrs ago

## 2020-05-07 ENCOUNTER — Ambulatory Visit: Payer: 59 | Admitting: Family

## 2020-05-07 ENCOUNTER — Other Ambulatory Visit: Payer: Self-pay

## 2020-05-07 ENCOUNTER — Encounter: Payer: Self-pay | Admitting: Family

## 2020-05-07 ENCOUNTER — Encounter: Payer: Self-pay | Admitting: *Deleted

## 2020-05-07 ENCOUNTER — Ambulatory Visit (INDEPENDENT_AMBULATORY_CARE_PROVIDER_SITE_OTHER): Payer: 59

## 2020-05-07 VITALS — BP 114/80 | HR 67 | Ht 60.0 in | Wt 175.6 lb

## 2020-05-07 DIAGNOSIS — I10 Essential (primary) hypertension: Secondary | ICD-10-CM

## 2020-05-07 DIAGNOSIS — R002 Palpitations: Secondary | ICD-10-CM

## 2020-05-07 DIAGNOSIS — R079 Chest pain, unspecified: Secondary | ICD-10-CM

## 2020-05-07 DIAGNOSIS — E876 Hypokalemia: Secondary | ICD-10-CM

## 2020-05-07 DIAGNOSIS — G4733 Obstructive sleep apnea (adult) (pediatric): Secondary | ICD-10-CM

## 2020-05-07 DIAGNOSIS — E782 Mixed hyperlipidemia: Secondary | ICD-10-CM

## 2020-05-07 MED ORDER — METOPROLOL TARTRATE 100 MG PO TABS
100.0000 mg | ORAL_TABLET | Freq: Once | ORAL | 0 refills | Status: DC
Start: 2020-05-07 — End: 2020-06-30

## 2020-05-07 NOTE — Progress Notes (Signed)
Office Visit    Patient Name: Felicia Barnett Date of Encounter: 05/07/2020  Primary Care Provider:  Emogene Morgan, MD Primary Cardiologist:  Julien Nordmann, MD Electrophysiologist:  None   Chief Complaint    Felicia Barnett is a 54 y.o. female with a hx of  HTN, DM2, palpitation, HLD, anxiety, chest pain, hypokalemia, OSA on CPAP, prior tobacco abuse  presents today for follow up after ED visit for chest pain.   Past Medical History    Past Medical History:  Diagnosis Date  . Anxiety    since surgery, pt has had attacks w/ fear of not being able to move her leg (anxiety attacks occur  3-4x month.)  . Diabetes mellitus without complication (HCC)    pre-diabetes  . H/O prior ablation treatment   . Heart murmur    palpatiations  . Hyperlipidemia   . Hypertension   . SVT (supraventricular tachycardia) (HCC)    Past Surgical History:  Procedure Laterality Date  . ABDOMINAL HYSTERECTOMY    . BREAST BIOPSY Right    neg  . cardial ablation     anxiety attack (heart palpitations HR=210 bpm) the day she was released from the hospital from surgery. Findings were neg .  Marland Kitchen COLONOSCOPY      Allergies  No Known Allergies  History of Present Illness    Felicia Barnett is a 54 y.o. female with a hx of HTN, DM2, palpitation, HLD, anxiety, chest pain, hypokalemia, OSA on CPAP, prior tobacco abuse last seen by Dr. Mariah Milling on 11/12/19.  Noted family history of heart disease in her mother and father.  She had a previous stress test in 2015 with no ischemia.  CT chest 2017 minimal coronary calcification.  CT abdomen 2019 with minimal aortic atherosclerosis.  Seen in the ED 10/11/19 with chest pain.  Was described as being located in the center and left part of her chest and described as "tightness ".  Also endorse weakness, dizziness, elevated BP. Troponin negative x2. EKG and CXR witout acute findings. Seen by PCP 10/16/2019 for dizziness. She was prescribed meclizine.   Labs  10/16/2019 via [facility]:  COVID negative  Hb 13.0 Hct 37.9  K 4.0, creatinine 0.7, AST 17, ALT 26, Magnesium 1.9, GFR 87  A1c 5.7 TSH 2.555  Seen in clinic 10/23/19 after ED visit for chest pain which was musculoskeletal. She was started on Crestor 5mg  due to LDL of 158. Seen by Dr. 11/2019 with palpitations and chest pressure. Chest pain was presumed to be musculoskeletal as it was atypical for angina.   ED visit 03/05/20 for gastritis without bleeding. Serial troponin were negative. Seen by primary care 04/14/20 for URI/cough - she was on amoxicillin and clarithromycin for h.pylori. She presented to the ED 05/05/20 for chest pain and palpitations but left without being seen. HD troponin of 4 and 5. Normal renal function, electrolytes, CBC. CXR with no active cardiopulmonary disease. EKG NSR 61 bpm with no acute ST/T wave changes on my independent review.  Tells me she has had palpitations in the past, but this time the palpitations have been consistent rather than intermittent. Feels like her heart is "shaking" inside of her and this makes her feel very anxious. Tells me she went to the ED because her blood pressure was high and she had subsequent chest pain and palpitations.   Tells me she had a friend who started throwing up and found she had a "break off" of plaque  and this had made her very anxious.  She also worries about heart disease as there is a strong family history of coronary disease.  She has been intermittent fasting to try to stay healthy.  She was diagnosed with prediabetes and is trying to lower her blood sugar and weight.  Tells me she sometimes will get a muscle pain inside her arm that feels like a pressure that radiates down her arm. Happens at rest and with activity.   EKGs/Labs/Other Studies Reviewed:   The following studies were reviewed today:  EKG:  EKG is ordered today.  The ekg ordered today demonstrates SR 67 bpm with no acute ST/T wave changes.    Recent Labs: 03/05/2020: Magnesium 2.1 05/05/2020: ALT 25; BUN 14; Creatinine, Ser 0.77; Hemoglobin 12.6; Platelets 227; Potassium 4.0; Sodium 140  Recent Lipid Panel    Component Value Date/Time   CHOL 236 (H) 10/23/2019 1051   TRIG 282 (H) 10/23/2019 1051   HDL 41 10/23/2019 1051   CHOLHDL 5.8 (H) 10/23/2019 1051   LDLCALC 144 (H) 10/23/2019 1051   LDLDIRECT 158 (H) 10/23/2019 1051    Home Medications   Current Meds  Medication Sig  . fluticasone (FLONASE) 50 MCG/ACT nasal spray Place into the nose.  . pantoprazole (PROTONIX) 40 MG tablet TAKE 1 TABLET BY MOUTH DAILY FOR REFLUX/ HEARTBURN 30 MINUTES BEFORE FIRST MEAL  . potassium chloride SA (KLOR-CON) 20 MEQ tablet TAKE 1 TABLET BY MOUTH AS DIRECTED. TAKE 1 TABLET ONCE DAILY AND 2 TABLETS 2 DAYS A WEEK.    Review of Systems      Review of Systems  Constitutional: Negative for chills, fever and malaise/fatigue.  Cardiovascular: Positive for chest pain and palpitations. Negative for dyspnea on exertion, irregular heartbeat, leg swelling, near-syncope, orthopnea and syncope.  Respiratory: Negative for cough, shortness of breath and wheezing.   Gastrointestinal: Negative for melena, nausea and vomiting.  Genitourinary: Negative for hematuria.  Neurological: Negative for dizziness, light-headedness and weakness.  Psychiatric/Behavioral: The patient is nervous/anxious.    All other systems reviewed and are otherwise negative except as noted above.  Physical Exam    VS:  There were no vitals taken for this visit. , BMI There is no height or weight on file to calculate BMI. GEN: Well nourished, well developed, in no acute distress. HEENT: normal. Neck: Supple, no JVD, carotid bruits, or masses. Cardiac: bradycardic and regular, no murmurs, rubs, or gallops. No clubbing, cyanosis, edema.  Radials/DP/PT 2+ and equal bilaterally.  Respiratory:  Respirations regular and unlabored, clear to auscultation bilaterally. GI: Soft,  nontender, nondistended, BS + x 4. MS: No deformity or atrophy. L arm with full range of motion - some discomfort with extension.  Skin: Warm and dry, no rash. Neuro:  Strength and sensation are intact. Anxious.  Psych: Normal affect.  Assessment & Plan    1. Chest pain -low risk stress test remotely in 2015.  CT chest 09/2016 with no coronary calcifications. Multiple ED visits for chest pain with negative HS troponin.  Most recent ED visit 05/05/2020 for chest pain with EKG no acute changes, chest x-ray unrevealing, high-sensitivity troponin negative.  She has had five ED visits over the last year for chest pain.  Most recent ischemic eval in 2015. Due to risk factors including family history of coronary disease, HTN, HLD, former tobacco use will plan for ischemic evaluation. Plan for coronary CTA.   2. Palpitations/PVC/paroxysmal tachycardia- History of short bursts of tachycardia and PVC.  Reports worsening palpitations despite  Bystolic 5 mg twice daily and propranolol 20 mg as needed.  14-day ZIO monitor placed today.  Recent BMP with normal electrolytes.  3. HTN - BP well controlled. Continue current antihypertensive regimen.   4. Hypokalemia -recent ED visit with K 4.0. Continue potassium supplementation.    5. HLD - 10/2019 LDL 158. She was started on Crestor 5mg  daily. 05/05/20 normal AST/ALT.she politely declines lab work today as she just had lab work in the ED and does not want to be stuck again.  We will obtain lipid panel at follow-up.  6. OSA - Continued CPAP compliance encouraged.   Disposition: Follow up in 6-8 week(s) with Dr. 05/07/20 or APP with direct LDL at that time.  Mariah Milling, NP 05/07/2020, 10:10 AM

## 2020-05-07 NOTE — Patient Instructions (Addendum)
Medication Instructions:  No medication changes today.   *If you need a refill on your cardiac medications before your next appointment, please call your pharmacy*  Lab Work: No lab work today.  We will plan to recheck your cholesterol the next time you are seen in clinic.   Testing/Procedures: 1-  Please wear the ZIO monitor that was placed for 2 weeks. If you go to the beach next weekend to celebrate your birthday, you may take the monitor off early.   2-  Your physician has requested that you have cardiac CT. Cardiac computed tomography (CT) is a painless test that uses an x-ray machine to take clear, detailed pictures of your heart.  Your cardiac CT will be scheduled at one of the below locations:   [facility] [address] [place], [place] [address] [phone]  [facility] [address] [place], [place] [address] [phone]  If scheduled at [facility], please arrive at the [facility] main entrance of [facility] 30 minutes prior to test start time. Proceed to the [facility] Radiology Department (first floor) to check-in and test prep.  If scheduled at [facility], please arrive 15 mins early for check-in and test prep.  Please follow these instructions carefully (unless otherwise directed):  Hold all erectile dysfunction medications at least 3 days (72 hrs) prior to test.  On the Night Before the Test: . Be sure to Drink plenty of water. . Do not consume any caffeinated/decaffeinated beverages or chocolate 12 hours prior to your test. . Do not take any antihistamines 12 hours prior to your test.  On the Day of the Test: . Drink plenty of water. Do not drink any water within one hour of the test. . Do not eat any food 4 hours prior to the test. . You may take your regular medications prior to the test.  . Take metoprolol (Lopressor) two hours prior to  test.  . HOLD Furosemide/Hydrochlorothiazide morning of the test. . FEMALES- please wear underwire-free bra if available       After the Test: . Drink plenty of water. . After receiving IV contrast, you may experience a mild flushed feeling. This is normal. . On occasion, you may experience a mild rash up to 24 hours after the test. This is not dangerous. If this occurs, you can take Benadryl 25 mg and increase your fluid intake. . If you experience trouble breathing, this can be serious. If it is severe call 911 IMMEDIATELY. If it is mild, please call our office. . If you take any of these medications: Glipizide/Metformin, Avandament, Glucavance, please do not take 48 hours after completing test unless otherwise instructed.   Once we have confirmed authorization from your insurance company, we will call you to set up a date and time for your test. Based on how quickly your insurance processes prior authorizations requests, please allow up to 4 weeks to be contacted for scheduling your Cardiac CT appointment. Be advised that routine Cardiac CT appointments could be scheduled as many as 8 weeks after your provider has ordered it.  For non-scheduling related questions, please contact the cardiac imaging nurse navigator should you have any questions/concerns: Marchia Bond, Cardiac Imaging Nurse Navigator Burley Saver, Interim Cardiac Imaging Nurse [facility] Direct Office Dial: [phone]   For scheduling needs, including cancellations and rescheduling, please call Vivien Rota at [phone], option 3.  Follow-Up: At [facility], you and your health needs are our priority.  As part of our continuing mission to provide you with exceptional heart care, we have created designated Provider Care Teams.  These Care Teams include your primary Cardiologist (physician) and Advanced Practice Providers (APPs -  Physician Assistants and Nurse Practitioners) who all  work together to provide you with the care you need, when you need it.  We recommend signing up for the patient portal called "MyChart".  Sign up information is provided on this After Visit Summary.  MyChart is used to connect with patients for Virtual Visits (Telemedicine).  Patients are able to view lab/test results, encounter notes, upcoming appointments, etc.  Non-urgent messages can be sent to your provider as well.   To learn more about what you can do with MyChart, go to [email or web address].    Your next appointment:   5 week(s)  The format for your next appointment:   In Person  Provider:   You may see Ida Rogue, MD or one of the following Advanced Practice Providers on your designated Care Team:    Murray Hodgkins, NP  Christell Faith, PA-C  Marrianne Mood, PA-C  Laurann [place], NP    Cardiac CT Angiogram A cardiac CT angiogram is a procedure to look at the heart and the area around the heart. It may be done to help find the cause of chest pains or other symptoms of heart disease. During this procedure, a substance called contrast dye is injected into the blood vessels in the area to be checked. A large X-ray machine, called a CT scanner, then takes detailed pictures of the heart and the surrounding area. The procedure is also sometimes called a coronary CT angiogram, coronary artery scanning, or CTA. A cardiac CT angiogram allows the health care provider to see how well blood is flowing to and from the heart. The health care provider will be able to see if there are any problems, such as:  Blockage or narrowing of the coronary arteries in the heart.  Fluid around the heart.  Signs of weakness or disease in the muscles, valves, and tissues of the heart. Tell a health care provider about:  Any allergies you have. This is especially important if you have had a previous allergic reaction to contrast dye.  All medicines you are taking, including vitamins, herbs, eye  drops, creams, and over-the-counter medicines.  Any blood disorders you have.  Any surgeries you have had.  Any medical conditions you have.  Whether you are pregnant or may be pregnant.  Any anxiety disorders, chronic pain, or other conditions you have that may increase your stress or prevent you from lying still. What are the risks? Generally, this is a safe procedure. However, problems may occur, including:  Bleeding.  Infection.  Allergic reactions to medicines or dyes.  Damage to other structures or organs.  Kidney damage from the contrast dye that is used.  Increased risk of cancer from radiation exposure. This risk is low. Talk with your health care provider about: ? The risks and benefits of testing. ? How you can receive the lowest dose of radiation. What happens before the procedure?  Wear comfortable clothing and remove any jewelry, glasses, dentures, and hearing aids.  Follow instructions from your health care provider about eating and drinking. This may include: ? For 12 hours before the procedure -- avoid caffeine. This includes tea, coffee, soda, energy drinks, and diet pills. Drink plenty of water or other fluids  that do not have caffeine in them. Being well hydrated can prevent complications. ? For 4-6 hours before the procedure -- stop eating and drinking. The contrast dye can cause nausea, but this is less likely if your stomach is empty.  Ask your health care provider about changing or stopping your regular medicines. This is especially important if you are taking diabetes medicines, blood thinners, or medicines to treat problems with erections (erectile dysfunction). What happens during the procedure?   Hair on your chest may need to be removed so that small sticky patches called electrodes can be placed on your chest. These will transmit information that helps to monitor your heart during the procedure.  An IV will be inserted into one of your  veins.  You might be given a medicine to control your heart rate during the procedure. This will help to ensure that good images are obtained.  You will be asked to lie on an exam table. This table will slide in and out of the CT machine during the procedure.  Contrast dye will be injected into the IV. You might feel warm, or you may get a metallic taste in your mouth.  You will be given a medicine called nitroglycerin. This will relax or dilate the arteries in your heart.  The table that you are lying on will move into the CT machine tunnel for the scan.  The person running the machine will give you instructions while the scans are being done. You may be asked to: ? Keep your arms above your head. ? Hold your breath. ? Stay very still, even if the table is moving.  When the scanning is complete, you will be moved out of the machine.  The IV will be removed. The procedure may vary among health care providers and hospitals. What can I expect after the procedure? After your procedure, it is common to have:  A metallic taste in your mouth from the contrast dye.  A feeling of warmth.  A headache from the nitroglycerin. Follow these instructions at home:  Take over-the-counter and prescription medicines only as told by your health care provider.  If you are told, drink enough fluid to keep your urine pale yellow. This will help to flush the contrast dye out of your body.  Most people can return to their normal activities right after the procedure. Ask your health care provider what activities are safe for you.  It is up to you to get the results of your procedure. Ask your health care provider, or the department that is doing the procedure, when your results will be ready.  Keep all follow-up visits as told by your health care provider. This is important. Contact a health care provider if:  You have any symptoms of allergy to the contrast dye. These include: ? Shortness of  breath. ? Rash or hives. ? A racing heartbeat. Summary  A cardiac CT angiogram is a procedure to look at the heart and the area around the heart. It may be done to help find the cause of chest pains or other symptoms of heart disease.  During this procedure, a large X-ray machine, called a CT scanner, takes detailed pictures of the heart and the surrounding area after a contrast dye has been injected into blood vessels in the area.  Ask your health care provider about changing or stopping your regular medicines before the procedure. This is especially important if you are taking diabetes medicines, blood thinners, or medicines to  treat erectile dysfunction.  If you are told, drink enough fluid to keep your urine pale yellow. This will help to flush the contrast dye out of your body. This information is not intended to replace advice given to you by your health care provider. Make sure you discuss any questions you have with your health care provider. Document Revised: 05/22/2019 Document Reviewed: 05/22/2019 Elsevier Patient Education  [employer].

## 2020-05-28 ENCOUNTER — Telehealth: Payer: Self-pay | Admitting: Family

## 2020-05-28 NOTE — Telephone Encounter (Signed)
Patient calling back   She is aware of below and knows to expect a call soon when results are available

## 2020-05-28 NOTE — Telephone Encounter (Signed)
Patient would like monitor results. 

## 2020-05-28 NOTE — Telephone Encounter (Signed)
Reviewed the Zio website and the patient's monitor has been processing since 05/21/20. This should be final within the next day or so I would presume.   I attempted to contact the patient by phone. No answer- I left a message to please call back (no DPR on file).

## 2020-06-03 ENCOUNTER — Telehealth (HOSPITAL_COMMUNITY): Payer: Self-pay | Admitting: Emergency Medicine

## 2020-06-03 NOTE — Telephone Encounter (Signed)
Attempted to call patient regarding upcoming cardiac CT appointment. °Left message on voicemail with name and callback number °Marino Rogerson RN Navigator Cardiac Imaging °[facility] °[phone] Office °[phone] Cell ° °

## 2020-06-03 NOTE — Telephone Encounter (Signed)
Pt returning phone call regarding upcoming cardiac imaging study; pt verbalizes understanding of appt date/time, parking situation and where to check in, pre-test NPO status and medications ordered, and verified current allergies; name and call back number provided for further questions should they arise Karim Aiello RN Navigator Cardiac Imaging [facility] [phone] office [phone] cell   

## 2020-06-04 ENCOUNTER — Ambulatory Visit
Admission: RE | Admit: 2020-06-04 | Discharge: 2020-06-04 | Disposition: A | Discharge status: Home / Self Care | Payer: 59 | Source: Ambulatory Visit | Attending: Family | Admitting: Family

## 2020-06-04 ENCOUNTER — Telehealth: Payer: Self-pay

## 2020-06-04 ENCOUNTER — Other Ambulatory Visit: Payer: Self-pay

## 2020-06-04 DIAGNOSIS — R079 Chest pain, unspecified: Secondary | ICD-10-CM | POA: Insufficient documentation

## 2020-06-04 MED ORDER — DIPHENHYDRAMINE HCL 50 MG PO CAPS
50.0000 mg | ORAL_CAPSULE | Freq: Once | ORAL | Status: AC
  Administered 2020-06-04: 50 mg via ORAL

## 2020-06-04 MED ORDER — NITROGLYCERIN 0.4 MG SL SUBL
0.8000 mg | SUBLINGUAL_TABLET | Freq: Once | SUBLINGUAL | Status: AC
  Administered 2020-06-04: 0.8 mg via SUBLINGUAL

## 2020-06-04 MED ORDER — IOHEXOL 350 MG/ML SOLN
85.0000 mL | Freq: Once | INTRAVENOUS | Status: AC | PRN
  Administered 2020-06-04: 85 mL via INTRAVENOUS

## 2020-06-04 MED ORDER — METOPROLOL TARTRATE 5 MG/5ML IV SOLN
10.0000 mg | Freq: Once | INTRAVENOUS | Status: AC
  Administered 2020-06-04: 10 mg via INTRAVENOUS

## 2020-06-04 NOTE — Telephone Encounter (Signed)
Spoke with patient and gave her result note. Patient was very grateful for the phone call. She stated she did have a reaction to the contrast dye so she is still be observed at the moment and was given a benadryl.

## 2020-06-04 NOTE — Telephone Encounter (Signed)
LMTC to give Coronary Calcium note from Gillian Shields as follows:  Coronary calcium score of 0. No evidence of plaque or blockages in the heart. No evidence of heart disease. Great result!

## 2020-06-04 NOTE — Progress Notes (Signed)
Dr Nadene Rubins reassessed patient. Patient states feels better denies itching and hives resolved on face and right chest. Continues to deny shortness of breath.Patient drank water and ate cracker with peanut butter. Airway intact bilateral equal chest rise and fall. Doctor suggested and patient verbalized understanding to have family member drive patient home.

## 2020-06-04 NOTE — Progress Notes (Signed)
Patient tolerated procedure well. Ambulate w/o difficulty. Sitting in chair drinking. No needs. All questions answered. ABC intact. Discharge from procedure area w/o issues. 

## 2020-06-04 NOTE — Progress Notes (Signed)
Patient went to restroom and then to front desk to report itching.  Patient found to have hives forming on chest and face. Placed in chair in room. Dr. Nadene Rubins at side. VS: 153/86, 100%, 62.  Benadryl 50mg  PO given.  All questions answered. Continuing to monitor patient.

## 2020-06-05 ENCOUNTER — Telehealth: Payer: Self-pay

## 2020-06-05 NOTE — Telephone Encounter (Signed)
Patient made aware of results with verbalized understanding. 

## 2020-06-05 NOTE — Telephone Encounter (Signed)
Called to give the patient Cardiac CT results.     Alver Sorrow, NP  06/04/2020 4:13 PM EDT     Coronary calcium score of 0. No evidence of plaque or blockages in the heart. No evidence of heart disease. Great result!

## 2020-06-05 NOTE — Telephone Encounter (Signed)
-----   Message from Alver Sorrow, NP sent at 06/04/2020  4:45 PM EDT ----- Non-cardiac read of CT scan shows small hiatal hernia. Also notes hepatic steatosis or "fatty liver". Recent blood work shows normal liver function so no concern. Recommend avoiding alcohol and fried foods. She had small pulmonary nodule which is likely benign (not dangerous) and very common. She and her primary care can discuss doing a repeat CT scan in one year for monitoring. I have forwarded results.

## 2020-06-11 ENCOUNTER — Other Ambulatory Visit: Payer: Self-pay | Admitting: Family Medicine

## 2020-06-11 DIAGNOSIS — Z1231 Encounter for screening mammogram for malignant neoplasm of breast: Secondary | ICD-10-CM

## 2020-06-19 ENCOUNTER — Ambulatory Visit: Payer: Self-pay

## 2020-06-19 ENCOUNTER — Other Ambulatory Visit: Payer: Self-pay | Admitting: Family Medicine

## 2020-06-19 ENCOUNTER — Other Ambulatory Visit: Payer: Self-pay

## 2020-06-19 DIAGNOSIS — M546 Pain in thoracic spine: Secondary | ICD-10-CM

## 2020-06-19 DIAGNOSIS — M545 Low back pain, unspecified: Secondary | ICD-10-CM

## 2020-06-30 ENCOUNTER — Ambulatory Visit: Payer: 59 | Admitting: Cardiovascular Disease

## 2020-06-30 ENCOUNTER — Other Ambulatory Visit: Payer: Self-pay

## 2020-06-30 ENCOUNTER — Encounter: Payer: Self-pay | Admitting: Cardiovascular Disease

## 2020-06-30 VITALS — BP 120/80 | HR 67 | Ht 60.0 in | Wt 177.5 lb

## 2020-06-30 DIAGNOSIS — I479 Paroxysmal tachycardia, unspecified: Secondary | ICD-10-CM | POA: Diagnosis not present

## 2020-06-30 DIAGNOSIS — E782 Mixed hyperlipidemia: Secondary | ICD-10-CM | POA: Diagnosis not present

## 2020-06-30 DIAGNOSIS — R079 Chest pain, unspecified: Secondary | ICD-10-CM | POA: Diagnosis not present

## 2020-06-30 DIAGNOSIS — F418 Other specified anxiety disorders: Secondary | ICD-10-CM

## 2020-06-30 DIAGNOSIS — I10 Essential (primary) hypertension: Secondary | ICD-10-CM

## 2020-06-30 DIAGNOSIS — G4733 Obstructive sleep apnea (adult) (pediatric): Secondary | ICD-10-CM

## 2020-06-30 NOTE — Progress Notes (Signed)
And that cardiology Office Note  Date:  06/30/2020   ID:  Felicia Barnett, DOB: 10-Feb-1966, MRN: [id number]  PCP:  Emogene Morgan, MD   Chief Complaint  Patient presents with  . other    8 week follow up and discuss Zio monitor. Meds reviewed by the pt. verbally. Pt. c/o fluttering in chest and feeling palpitations in her ears.     HPI:  Felicia Barnett is a 54 y.o. female with a history of: Hypertension Diabetes Palpitations Hyperlipidemia  Anxiety about health Smoker, quit 55 yo Who presents to the office today for palpitations and chest pressure.  Seen in the emergency room in July for chest pain Recently seen by one of our providers for palpitations, chest pain  zio monitor was ordered showing no significant arrhythmia Rare short runs SVT/atrial tachycardia longest was 7 beatsSelect patient triggered events (2) associated with short runs of atrial tachycardia, most triggered events associated with normal sinus rhythm.  Cardiac CTA ordered by ER provider for chest pain Results reviewed with her, no coronary calcification, normal coronary arteries  Uses CPAP  Reports having a pulasation in ear Has not discussed with primary care  EKG personally reviewed by myself on todays visit Shows normal sinus rhythm rate 67 bpm no significant ST or T wave changes  Other past medical history reviewed CT abdomen 2019 reviewed with minimal aortic atherosclerosis Prior CT chest 2017 with minimal coronary calcification  Long history of palpitations, taking Bystolic 5 twice daily. Prior Holter with PVCs    PMH:   has a past medical history of Anxiety, Diabetes mellitus without complication (HCC), H/O prior ablation treatment, Heart murmur, Hyperlipidemia, Hypertension, and SVT (supraventricular tachycardia) (HCC).  PSH:    Past Surgical History:  Procedure Laterality Date  . ABDOMINAL HYSTERECTOMY    . BREAST BIOPSY Right    neg  . cardial ablation     anxiety attack (heart  palpitations HR=210 bpm) the day she was released from the hospital from surgery. Findings were neg .  Marland Kitchen COLONOSCOPY      Current Outpatient Medications  Medication Sig Dispense Refill  . Ascorbic Acid (VITAMIN C) 100 MG tablet Take 100 mg by mouth daily.    . chlorthalidone (HYGROTON) 25 MG tablet Take 25 mg by mouth every other day.     . cholecalciferol (VITAMIN D) 25 MCG (1000 UT) tablet Take 1,000 Units by mouth daily.    . Cyanocobalamin (VITAMIN B 12 PO) Take 1 mg by mouth every other day.     . famotidine (PEPCID) 20 MG tablet Take 20 mg by mouth 2 (two) times daily.    . Multiple Vitamins-Minerals (ZINC PO) Take by mouth every other day.    . nebivolol (BYSTOLIC) 5 MG tablet Take 5 mg by mouth 2 (two) times daily.     . NON FORMULARY Liver Detox 1 capsule qd.    . NON FORMULARY Blood sugar support Takes 1 capsule qd.    . pantoprazole (PROTONIX) 40 MG tablet TAKE 1 TABLET BY MOUTH DAILY FOR REFLUX/ HEARTBURN 30 MINUTES BEFORE FIRST MEAL    . Potassium Chloride ER 20 MEQ TBCR TAKE 1 TABLET BY MOUTH AS DIRECTED. TAKE 1 TABLET ONCE DAILY AND 2 TABLETS 2 DAYS A WEEK. 35 tablet 6  . Probiotic Product (PROBIOTIC DAILY PO) Take by mouth.    . propranolol (INDERAL) 20 MG tablet Take 1 tablet (20 mg total) by mouth 3 (three) times daily as needed. For breakthrough palpitations. 90 tablet  3  . rosuvastatin (CRESTOR) 5 MG tablet Take 1 tablet (5 mg total) by mouth at bedtime. 90 tablet 3  . valACYclovir (VALTREX) 500 MG tablet Take 500 mg by mouth 2 (two) times daily as needed (for flares).      No current facility-administered medications for this visit.    ALLERGIES:   Iodinated diagnostic agents   SOCIAL HISTORY:  The patient  reports that she quit smoking about 11 years ago. She quit after 20.00 years of use. She has never used smokeless tobacco. She reports current alcohol use. She reports that she does not use drugs.   FAMILY HISTORY:   family history includes Diabetes in her father  and mother; Heart attack in her father; Heart disease in her mother; Hypertension in her father and mother.    REVIEW OF SYSTEMS: Review of Systems  Constitutional: Negative.   HENT: Negative.   Eyes: Negative.   Respiratory: Negative.   Cardiovascular: Negative.  Negative for leg swelling.  Gastrointestinal: Negative.   Genitourinary: Negative.   Musculoskeletal: Negative.   Neurological: Negative.   Psychiatric/Behavioral: The patient is nervous/anxious.   All other systems reviewed and are negative.   PHYSICAL EXAM: VS:  BP 120/80 (BP Location: Left Arm, Patient Position: Sitting, Cuff Size: Normal)   Pulse 67   Ht 5' (1.524 m)   Wt 177 lb 8 oz (80.5 kg)   BMI 34.67 kg/m  , BMI Body mass index is 34.67 kg/m. Constitutional:  oriented to person, place, and time. No distress.  HENT:  Head: Grossly normal Eyes:  no discharge. No scleral icterus.  Neck: No JVD, no carotid bruits  Cardiovascular: Regular rate and rhythm, no murmurs appreciated Pulmonary/Chest: Clear to auscultation bilaterally, no wheezes or rails Abdominal: Soft.  no distension.  no tenderness.  Musculoskeletal: Normal range of motion Neurological:  normal muscle tone. Coordination normal. No atrophy Skin: Skin warm and dry Psychiatric: normal affect, pleasant   RECENT LABS: 03/05/2020: Magnesium 2.1 05/05/2020: ALT 25; BUN 14; Creatinine, Ser 0.77; Hemoglobin 12.6; Platelets 227; Potassium 4.0; Sodium 140    LIPID PANEL: Lab Results  Component Value Date   CHOL 236 (H) 10/23/2019   HDL 41 10/23/2019   LDLCALC 144 (H) 10/23/2019   TRIG 282 (H) 10/23/2019      WEIGHT: Wt Readings from Last 3 Encounters:  06/30/20 177 lb 8 oz (80.5 kg)  05/07/20 175 lb 9.6 oz (79.7 kg)  05/05/20 173 lb (78.5 kg)     ASSESSMENT AND PLAN:  Paroxysmal tachycardia (HCC) Symptoms well controlled on today's visit, no medication changes made  Palpitations - Rare episodes of tachycardia, on monitor Often normal  sinus rhythm associated with her triggered events No further work-up needed, no changes to medications needed  Essential hypertension Blood pressure is well controlled on today's visit. No changes made to the medications.  Atypical chest pain Cardiac CTA previously ordered, no coronary calcification, normal coronary arteries, no further work-up needed   Anxiety concerning health Reassurance provided  Disposition:   F/U as needed  Total encounter time more than 25 minutes. Greater than 50% was spent in counseling and coordination of care with the patient.    Signed, Dossie Arbour, M.D., Ph.D. 06/30/2020  [facility] [place], [place] [phone]

## 2020-06-30 NOTE — Patient Instructions (Signed)
Medication Instructions:  No changes  If you need a refill on your cardiac medications before your next appointment, please call your pharmacy.    Lab work: No new labs needed   If you have labs (blood work) drawn today and your tests are completely normal, you will receive your results only by: . MyChart Message (if you have MyChart) OR . A paper copy in the mail If you have any lab test that is abnormal or we need to change your treatment, we will call you to review the results.   Testing/Procedures: No new testing needed   Follow-Up: At [facility], you and your health needs are our priority.  As part of our continuing mission to provide you with exceptional heart care, we have created designated Provider Care Teams.  These Care Teams include your primary Cardiologist (physician) and Advanced Practice Providers (APPs -  Physician Assistants and Nurse Practitioners) who all work together to provide you with the care you need, when you need it.  . You will need a follow up appointment in 12 months  . Providers on your designated Care Team:   . Christopher Berge, NP . Ryan Dunn, PA-C . Jacquelyn Visser, PA-C  Any Other Special Instructions Will Be Listed Below (If Applicable).  COVID-19 Vaccine Information can be found at: [email or web address] For questions related to vaccine distribution or appointments, please email [email or web address] or call [phone].     

## 2020-07-08 ENCOUNTER — Ambulatory Visit
Admission: RE | Admit: 2020-07-08 | Discharge: 2020-07-08 | Disposition: A | Discharge status: Home / Self Care | Payer: 59 | Source: Ambulatory Visit | Attending: Family Medicine | Admitting: Family Medicine

## 2020-07-08 DIAGNOSIS — Z1231 Encounter for screening mammogram for malignant neoplasm of breast: Secondary | ICD-10-CM | POA: Insufficient documentation

## 2020-07-20 ENCOUNTER — Other Ambulatory Visit: Payer: Self-pay | Admitting: Family Medicine

## 2020-07-20 ENCOUNTER — Other Ambulatory Visit: Payer: Self-pay | Admitting: Neurology

## 2020-07-20 DIAGNOSIS — S0990XA Unspecified injury of head, initial encounter: Secondary | ICD-10-CM

## 2020-07-20 DIAGNOSIS — N6320 Unspecified lump in the left breast, unspecified quadrant: Secondary | ICD-10-CM

## 2020-07-20 DIAGNOSIS — R928 Other abnormal and inconclusive findings on diagnostic imaging of breast: Secondary | ICD-10-CM

## 2020-07-22 ENCOUNTER — Other Ambulatory Visit: Payer: Self-pay

## 2020-07-23 ENCOUNTER — Ambulatory Visit
Admission: RE | Admit: 2020-07-23 | Discharge: 2020-07-23 | Disposition: A | Discharge status: Home / Self Care | Payer: 59 | Source: Ambulatory Visit | Attending: Family Medicine | Admitting: Family Medicine

## 2020-07-23 ENCOUNTER — Other Ambulatory Visit: Payer: Self-pay

## 2020-07-23 DIAGNOSIS — R928 Other abnormal and inconclusive findings on diagnostic imaging of breast: Secondary | ICD-10-CM

## 2020-07-23 DIAGNOSIS — N6320 Unspecified lump in the left breast, unspecified quadrant: Secondary | ICD-10-CM | POA: Diagnosis present

## 2020-07-24 ENCOUNTER — Encounter: Payer: Self-pay | Admitting: *Deleted

## 2020-07-24 ENCOUNTER — Encounter: Payer: Self-pay | Admitting: Gastroenterology

## 2020-07-24 ENCOUNTER — Ambulatory Visit (INDEPENDENT_AMBULATORY_CARE_PROVIDER_SITE_OTHER): Payer: 59 | Admitting: Gastroenterology

## 2020-07-24 ENCOUNTER — Other Ambulatory Visit: Payer: Self-pay

## 2020-07-24 VITALS — BP 130/75 | HR 70 | Temp 97.6°F | Ht 60.0 in | Wt 177.1 lb

## 2020-07-24 DIAGNOSIS — E8881 Metabolic syndrome: Secondary | ICD-10-CM

## 2020-07-24 DIAGNOSIS — R140 Abdominal distension (gaseous): Secondary | ICD-10-CM

## 2020-07-24 DIAGNOSIS — K5909 Other constipation: Secondary | ICD-10-CM

## 2020-07-24 DIAGNOSIS — Z8619 Personal history of other infectious and parasitic diseases: Secondary | ICD-10-CM

## 2020-07-24 DIAGNOSIS — K760 Fatty (change of) liver, not elsewhere classified: Secondary | ICD-10-CM | POA: Diagnosis not present

## 2020-07-24 NOTE — Patient Instructions (Signed)

## 2020-07-24 NOTE — Progress Notes (Signed)
Arlyss Repress, MD [address]  [address]  [place], [place] [address]  Main: [phone]  Fax: [phone]    Gastroenterology Consultation  Referring Provider:     Sandrea Hughs, NP Primary Care Physician:  Emogene Morgan, MD Primary Gastroenterologist:  Dr. Arlyss Repress Reason for Consultation:     Chronic abdominal bloating        HPI:   Felicia Barnett is a 54 y.o. female referred by Dr. Emogene Morgan, MD  for consultation & management of chronic abdominal bloating.  Patient was diagnosed with H. pylori infection based on positive H. pylori stool antigen in 03/2020, s/p treatment for 2 weeks.  She has been experiencing severe abdominal bloating for several months which is not improved.  She had a history of constipation, which is partially relieved with eating flaxseed daily.  However, she does have to strain during bowel movement.  She is trying to follow healthy diet and incorporate some exercise, trying to lose weight.  She is taking Pepcid as needed for heartburn.  She is also found to have fatty liver as well as hiatal hernia which were incidentally found on the coronary calcium CT scan Patient is taking a lot of supplements including probiotics Patient denies smoking.  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 Anxiety    since surgery, pt has had attacks w/ fear of not being able to move her leg (anxiety attacks occur  3-4x month.)  . Diabetes mellitus without complication (HCC)    pre-diabetes  . H/O prior ablation treatment   . Heart murmur    palpatiations  . Hyperlipidemia   . Hypertension   . SVT (supraventricular tachycardia) (HCC)     Past Surgical History:  Procedure Laterality Date  . ABDOMINAL HYSTERECTOMY    .  BREAST BIOPSY Right    neg  . cardial ablation     anxiety attack (heart palpitations HR=210 bpm) the day she was released from the hospital from surgery. Findings were neg .  Marland Kitchen COLONOSCOPY      Current Outpatient Medications:  .  Ascorbic Acid (VITAMIN C) 100 MG tablet, Take 100 mg by mouth daily., Disp: , Rfl:  .  baclofen (LIORESAL) 10 MG tablet, Take by mouth., Disp: , Rfl:  .  chlorthalidone (HYGROTON) 25 MG tablet, Take 25 mg by mouth every other day. , Disp: , Rfl:  .  cholecalciferol (VITAMIN D) 25 MCG (1000 UT) tablet, Take 1,000 Units by mouth daily., Disp: , Rfl:  .  Cyanocobalamin (VITAMIN B 12 PO), Take 1 mg by mouth every other day. , Disp: , Rfl:  .  famotidine (PEPCID) 20 MG tablet, Take 20 mg by mouth 2 (two) times daily., Disp: , Rfl:  .  Multiple Vitamins-Minerals (ZINC PO), Take by mouth every other day., Disp: , Rfl:  .  nebivolol (BYSTOLIC) 5 MG tablet, Take 5 mg by mouth 2 (two) times daily. , Disp: , Rfl:  .  NON FORMULARY, Liver Detox 1 capsule qd., Disp: , Rfl:  .  NON FORMULARY, Blood sugar support Takes 1 capsule qd., Disp: , Rfl:  .  Potassium Chloride ER 20 MEQ TBCR, TAKE 1 TABLET BY MOUTH AS DIRECTED. TAKE 1 TABLET ONCE DAILY AND 2 TABLETS 2 DAYS A WEEK.,  Disp: 35 tablet, Rfl: 6 .  Probiotic Product (PROBIOTIC DAILY PO), Take by mouth., Disp: , Rfl:  .  propranolol (INDERAL) 20 MG tablet, Take 1 tablet (20 mg total) by mouth 3 (three) times daily as needed. For breakthrough palpitations., Disp: 90 tablet, Rfl: 3 .  rosuvastatin (CRESTOR) 5 MG tablet, Take 1 tablet (5 mg total) by mouth at bedtime., Disp: 90 tablet, Rfl: 3 .  valACYclovir (VALTREX) 500 MG tablet, Take 500 mg by mouth 2 (two) times daily as needed (for flares). , Disp: , Rfl:  .  aspirin 81 MG chewable tablet, Chew by mouth. (Patient not taking: Reported on 07/24/2020), Disp: , Rfl:  .  pantoprazole (PROTONIX) 40 MG tablet, TAKE 1 TABLET BY MOUTH DAILY FOR REFLUX/ HEARTBURN 30 MINUTES BEFORE FIRST  MEAL (Patient not taking: Reported on 07/24/2020), Disp: , Rfl:    Family History  Problem Relation Age of Onset  . Heart disease Mother   . Diabetes Mother   . Hypertension Mother   . Heart attack Father   . Diabetes Father   . Hypertension Father   . Breast cancer Neg Hx      Social History   Tobacco Use  . Smoking status: Former Smoker    Years: 20.00    Quit date: 03/02/2009    Years since quitting: 11.4  . Smokeless tobacco: Never Used  Vaping Use  . Vaping Use: Never used  Substance Use Topics  . Alcohol use: Yes    Comment: occ  . Drug use: No    Allergies as of 07/24/2020 - Review Complete 07/24/2020  Allergen Reaction Noted  . Iodinated diagnostic agents Hives 06/04/2020    Review of Systems:    All systems reviewed and negative except where noted in HPI.   Physical Exam:  BP 130/75 (BP Location: Left Arm, Patient Position: Sitting, Cuff Size: Normal)   Pulse 70   Temp 97.6 F (36.4 C) (Oral)   Ht 5' (1.524 m)   Wt 177 lb 2 oz (80.3 kg)   BMI 34.59 kg/m  No LMP recorded. Patient has had a hysterectomy.  General:   Alert,  Well-developed, well-nourished, pleasant and cooperative in NAD Head:  Normocephalic and atraumatic. Eyes:  Sclera clear, no icterus.   Conjunctiva pink. Ears:  Normal auditory acuity. Nose:  No deformity, discharge, or lesions. Mouth:  No deformity or lesions,oropharynx pink & moist. Neck:  Supple; no masses or thyromegaly. Lungs:  Respirations even and unlabored.  Clear throughout to auscultation.   No wheezes, crackles, or rhonchi. No acute distress. Heart:  Regular rate and rhythm; no murmurs, clicks, rubs, or gallops. Abdomen:  Normal bowel sounds. Soft, non-tender and moderately distended, tympanic to percussion without masses, hepatosplenomegaly or hernias noted.  No guarding or rebound tenderness.   Rectal: Not performed Msk:  Symmetrical without gross deformities. Good, equal movement & strength bilaterally. Pulses:   Normal pulses noted. Extremities:  No clubbing or edema.  No cyanosis. Neurologic:  Alert and oriented x3;  grossly normal neurologically. Skin:  Intact without significant lesions or rashes. No jaundice. Psych:  Alert and cooperative. Normal mood and affect.  Imaging Studies: Reviewed  Assessment and Plan:   Felicia Barnett is a 54 y.o. Hispanic female from [place] with obesity, metabolic syndrome is seen in consultation for chronic abdominal bloating and constipation, history of H. pylori infection s/p treatment, hiatal hernia and fatty liver  Chronic abdominal bloating Recommend EGD given family history of stomach cancer as well as  biopsies to confirm eradication of H. pylori, rule out peptic ulcer disease Recommend to stop H2 blockers and PPI for 2 weeks before EGD  Chronic constipation Discussed about high-fiber diet, information provided Recommended to start MiraLAX daily  Colon cancer screening Obtain colonoscopy report from outside provider  Fatty liver: LFTs are normal Discussed in length regarding healthy diet and exercise, weight loss to prevent progression of fatty liver, control of diabetes Monitor LFTs annually   Follow up in 2 months   Arlyss Repress, MD

## 2020-07-27 ENCOUNTER — Telehealth: Payer: Self-pay

## 2020-07-27 NOTE — Telephone Encounter (Signed)
-----   Message from Toney Reil, MD sent at 07/24/2020 12:17 PM EDT ----- Regarding: Outside colonoscopy report Please obtain colonoscopy report from Dr. Margarita Rana office   address: [address], [place], [place] [address] Phone: [phone]

## 2020-07-27 NOTE — Telephone Encounter (Signed)
Faxed Medical release form to them

## 2020-07-31 ENCOUNTER — Ambulatory Visit
Admission: RE | Admit: 2020-07-31 | Discharge: 2020-07-31 | Disposition: A | Discharge status: Home / Self Care | Payer: 59 | Source: Ambulatory Visit | Attending: Neurology | Admitting: Neurology

## 2020-07-31 ENCOUNTER — Other Ambulatory Visit: Payer: Self-pay

## 2020-07-31 DIAGNOSIS — S0990XA Unspecified injury of head, initial encounter: Secondary | ICD-10-CM

## 2020-07-31 MED ORDER — GADOBUTROL 1 MMOL/ML IV SOLN
8.0000 mL | Freq: Once | INTRAVENOUS | Status: AC | PRN
  Administered 2020-07-31: 8 mL via INTRAVENOUS

## 2020-08-10 ENCOUNTER — Institutional Professional Consult (permissible substitution): Payer: 59 | Admitting: Pulmonary Disease

## 2020-08-10 ENCOUNTER — Other Ambulatory Visit: Payer: Self-pay

## 2020-08-10 ENCOUNTER — Other Ambulatory Visit
Admission: RE | Admit: 2020-08-10 | Discharge: 2020-08-10 | Disposition: A | Discharge status: Home / Self Care | Payer: 59 | Source: Ambulatory Visit | Attending: Gastroenterology | Admitting: Gastroenterology

## 2020-08-10 DIAGNOSIS — Z20822 Contact with and (suspected) exposure to covid-19: Secondary | ICD-10-CM | POA: Diagnosis not present

## 2020-08-10 DIAGNOSIS — Z01818 Encounter for other preprocedural examination: Secondary | ICD-10-CM | POA: Insufficient documentation

## 2020-08-10 LAB — SARS CORONAVIRUS 2 (TAT 6-24 HRS): SARS Coronavirus 2: NEGATIVE

## 2020-08-12 ENCOUNTER — Encounter: Payer: Self-pay | Admitting: Gastroenterology

## 2020-08-12 ENCOUNTER — Ambulatory Visit
Admission: RE | Admit: 2020-08-12 | Discharge: 2020-08-12 | Disposition: A | Discharge status: Home / Self Care | Payer: 59 | Attending: Gastroenterology | Admitting: Gastroenterology

## 2020-08-12 ENCOUNTER — Ambulatory Visit: Payer: 59 | Admitting: Anesthesiology

## 2020-08-12 ENCOUNTER — Other Ambulatory Visit: Payer: Self-pay

## 2020-08-12 ENCOUNTER — Encounter
Admission: RE | Disposition: A | Discharge status: Home / Self Care | Payer: Self-pay | Source: Home / Self Care | Attending: Gastroenterology

## 2020-08-12 DIAGNOSIS — K449 Diaphragmatic hernia without obstruction or gangrene: Secondary | ICD-10-CM | POA: Insufficient documentation

## 2020-08-12 DIAGNOSIS — B9681 Helicobacter pylori [H. pylori] as the cause of diseases classified elsewhere: Secondary | ICD-10-CM | POA: Insufficient documentation

## 2020-08-12 DIAGNOSIS — Z87891 Personal history of nicotine dependence: Secondary | ICD-10-CM | POA: Diagnosis not present

## 2020-08-12 DIAGNOSIS — Z8619 Personal history of other infectious and parasitic diseases: Secondary | ICD-10-CM | POA: Diagnosis not present

## 2020-08-12 DIAGNOSIS — K2950 Unspecified chronic gastritis without bleeding: Secondary | ICD-10-CM | POA: Diagnosis not present

## 2020-08-12 DIAGNOSIS — K2100 Gastro-esophageal reflux disease with esophagitis, without bleeding: Secondary | ICD-10-CM | POA: Insufficient documentation

## 2020-08-12 DIAGNOSIS — R140 Abdominal distension (gaseous): Secondary | ICD-10-CM | POA: Diagnosis present

## 2020-08-12 DIAGNOSIS — Z800 Family history of malignant neoplasm of digestive organs: Secondary | ICD-10-CM | POA: Diagnosis present

## 2020-08-12 DIAGNOSIS — K3189 Other diseases of stomach and duodenum: Secondary | ICD-10-CM | POA: Diagnosis not present

## 2020-08-12 DIAGNOSIS — Z888 Allergy status to other drugs, medicaments and biological substances status: Secondary | ICD-10-CM | POA: Diagnosis not present

## 2020-08-12 DIAGNOSIS — Z79899 Other long term (current) drug therapy: Secondary | ICD-10-CM | POA: Diagnosis not present

## 2020-08-12 HISTORY — PX: ESOPHAGOGASTRODUODENOSCOPY (EGD) WITH PROPOFOL: SHX5813

## 2020-08-12 SURGERY — ESOPHAGOGASTRODUODENOSCOPY (EGD) WITH PROPOFOL
Anesthesia: General

## 2020-08-12 MED ORDER — SODIUM CHLORIDE 0.9 % IV SOLN: INTRAVENOUS | Status: DC

## 2020-08-12 MED ORDER — PROPOFOL 500 MG/50ML IV EMUL
INTRAVENOUS | Status: DC | PRN
  Administered 2020-08-12: 200 ug/kg/min via INTRAVENOUS

## 2020-08-12 MED ORDER — LIDOCAINE HCL (PF) 1 % IJ SOLN
INTRAMUSCULAR | Status: AC
  Filled 2020-08-12: qty 2

## 2020-08-12 MED ORDER — PROPOFOL 500 MG/50ML IV EMUL
INTRAVENOUS | Status: AC
  Filled 2020-08-12: qty 50

## 2020-08-12 MED ORDER — GLYCOPYRROLATE 0.2 MG/ML IJ SOLN
INTRAMUSCULAR | Status: DC | PRN
  Administered 2020-08-12: .2 mg via INTRAVENOUS

## 2020-08-12 MED ORDER — LIDOCAINE 2% (20 MG/ML) 5 ML SYRINGE
INTRAMUSCULAR | Status: DC | PRN
  Administered 2020-08-12: 100 mg via INTRAVENOUS

## 2020-08-12 MED ORDER — PROPOFOL 10 MG/ML IV BOLUS
INTRAVENOUS | Status: DC | PRN
  Administered 2020-08-12: 70 mg via INTRAVENOUS
  Administered 2020-08-12: 30 mg via INTRAVENOUS

## 2020-08-12 MED ORDER — GLYCOPYRROLATE 0.2 MG/ML IJ SOLN
INTRAMUSCULAR | Status: AC
  Filled 2020-08-12: qty 1

## 2020-08-12 MED ORDER — LIDOCAINE HCL (PF) 2 % IJ SOLN
INTRAMUSCULAR | Status: AC
  Filled 2020-08-12: qty 5

## 2020-08-12 NOTE — Op Note (Signed)
[facility] Patient Name: Felicia Barnett Procedure Date: 08/12/2020 7:58 AM MRN: [id number] Account #: [id number] Date of Birth: 19-Jul-1966 Admit Type: Outpatient Age: 54 Room: [facility] ENDO ROOM 4 Gender: Female Note Status: Finalized Procedure:             Upper GI endoscopy Indications:           Previously treated for Helicobacter pylori, Positive                         Helicobacter pylori stool antigen test, Abdominal                         bloating, Family history of gastric cancer Providers:             Toney Reil MD, MD Referring MD:          Sylvie Farrier. Aycock MD (Referring MD) Medicines:             General Anesthesia Complications:         No immediate complications. Estimated blood loss: None. Procedure:             Pre-Anesthesia Assessment:                        - Prior to the procedure, a History and Physical was                         performed, and patient medications and allergies were                         reviewed. The patient is competent. The risks and                         benefits of the procedure and the sedation options and                         risks were discussed with the patient. All questions                         were answered and informed consent was obtained.                         Patient identification and proposed procedure were                         verified by the physician, the nurse, the                         anesthesiologist, the anesthetist and the technician                         in the pre-procedure area in the procedure room in the                         endoscopy suite. Mental Status Examination: alert and                         oriented. Airway Examination: normal oropharyngeal  airway and neck mobility. Respiratory Examination:                         clear to auscultation. CV Examination: normal.                         Prophylactic Antibiotics: The  patient does not require                         prophylactic antibiotics. Prior Anticoagulants: The                         patient has taken no previous anticoagulant or                         antiplatelet agents. ASA Grade Assessment: III - A                         patient with severe systemic disease. After reviewing                         the risks and benefits, the patient was deemed in                         satisfactory condition to undergo the procedure. The                         anesthesia plan was to use general anesthesia.                         Immediately prior to administration of medications,                         the patient was re-assessed for adequacy to receive                         sedatives. The heart rate, respiratory rate, oxygen                         saturations, blood pressure, adequacy of pulmonary                         ventilation, and response to care were monitored                         throughout the procedure. The physical status of the                         patient was re-assessed after the procedure.                        After obtaining informed consent, the endoscope was                         passed under direct vision. Throughout the procedure,                         the patient's blood pressure, pulse, and oxygen  saturations were monitored continuously. The Endoscope                         was introduced through the mouth, and advanced to the                         second part of duodenum. The upper GI endoscopy was                         accomplished without difficulty. The patient tolerated                         the procedure well. Findings:      The duodenal bulb and second portion of the duodenum were normal.      Diffuse mildly erythematous mucosa without bleeding was found in the       gastric fundus and in the gastric body. Biopsies were taken with a cold       forceps for Helicobacter pylori  testing.      The incisura and gastric antrum were normal. Biopsies were taken with a       cold forceps for Helicobacter pylori testing.      The cardia and gastric fundus were normal on retroflexion.      One tongue of salmon-colored mucosa was present from 34 to 35 cm. No       other visible abnormalities were present. The maximum longitudinal       extent of these esophageal mucosal changes was 1 cm in length. Biopsies       were taken with a cold forceps for histology.      A small hiatal hernia was present. Impression:            - Normal duodenal bulb and second portion of the                         duodenum.                        - Erythematous mucosa in the gastric fundus and                         gastric body. Biopsied.                        - Normal incisura and antrum. Biopsied.                        - Salmon-colored mucosa suspicious for short-segment                         Barrett's esophagus. Biopsied. Recommendation:        - Await pathology results.                        - Discharge patient to home (with escort).                        - Resume previous diet today.                        - Continue present medications.                        -  Await pathology results.                        - Follow an antireflux regimen.                        - Return to my office as previously scheduled. Procedure Code(s):     --- Professional ---                        [phone], Esophagogastroduodenoscopy, flexible,                         transoral; with biopsy, single or multiple Diagnosis Code(s):     --- Professional ---                        K22.8, Other specified diseases of esophagus                        K31.89, Other diseases of stomach and duodenum                        B96.81, Helicobacter pylori [H. pylori] as the cause                         of diseases classified elsewhere                        R14.0, Abdominal distension (gaseous)                         Z80.0, Family history of malignant neoplasm of                         digestive organs CPT copyright 2019 American Medical Association. All rights reserved. The codes documented in this report are preliminary and upon coder review may  be revised to meet current compliance requirements. Dr. Libby Maw Toney Reil MD, MD 08/12/2020 9:02:04 AM This report has been signed electronically. Number of Addenda: 0 Note Initiated On: 08/12/2020 7:58 AM Estimated Blood Loss:  Estimated blood loss: none. Estimated blood loss: none.      [facility]

## 2020-08-12 NOTE — Anesthesia Preprocedure Evaluation (Addendum)
Anesthesia Evaluation  Patient identified by MRN, date of birth, ID band Patient awake    Reviewed: Allergy & Precautions, H&P , NPO status , Patient's Chart, lab work & pertinent test results  History of Anesthesia Complications Negative for: history of anesthetic complications  Airway Mallampati: III  TM Distance: <3 FB Neck ROM: full    Dental  (+) Chipped   Pulmonary neg shortness of breath, sleep apnea and Continuous Positive Airway Pressure Ventilation , former smoker,    Pulmonary exam normal        Cardiovascular hypertension, + dysrhythmias Supra Ventricular Tachycardia      Neuro/Psych PSYCHIATRIC DISORDERS  Neuromuscular disease negative psych ROS   GI/Hepatic negative GI ROS, Neg liver ROS,   Endo/Other  diabetes, Type 2  Renal/GU negative Renal ROS  negative genitourinary   Musculoskeletal   Abdominal   Peds  Hematology negative hematology ROS (+)   Anesthesia Other Findings Past Medical History: No date: Anxiety     Comment:  since surgery, pt has had attacks w/ fear of not being               able to move her leg (anxiety attacks occur  3-4x month.) No date: Diabetes mellitus without complication (HCC)     Comment:  pre-diabetes No date: H/O prior ablation treatment No date: Heart murmur     Comment:  palpatiations No date: Hyperlipidemia No date: Hypertension No date: SVT (supraventricular tachycardia) (HCC)  Past Surgical History: No date: ABDOMINAL HYSTERECTOMY No date: BREAST BIOPSY; Right     Comment:  neg No date: cardial ablation     Comment:  anxiety attack (heart palpitations HR=210 bpm) the day               she was released from the hospital from surgery. Findings              were neg . No date: COLONOSCOPY  BMI    Body Mass Index: 34.57 kg/m      Reproductive/Obstetrics negative OB ROS                            Anesthesia Physical Anesthesia  Plan  ASA: III  Anesthesia Plan: General   Post-op Pain Management:    Induction: Intravenous  PONV Risk Score and Plan: Propofol infusion and TIVA  Airway Management Planned: Natural Airway and Nasal Cannula  Additional Equipment:   Intra-op Plan:   Post-operative Plan:   Informed Consent: I have reviewed the patients History and Physical, chart, labs and discussed the procedure including the risks, benefits and alternatives for the proposed anesthesia with the patient or authorized representative who has indicated his/her understanding and acceptance.     Dental Advisory Given  Plan Discussed with: Anesthesiologist, CRNA and Surgeon  Anesthesia Plan Comments: (Patient consented for risks of anesthesia including but not limited to:  - adverse reactions to medications - risk of intubation if required - damage to eyes, teeth, lips or other oral mucosa - nerve damage due to positioning  - sore throat or hoarseness - Damage to heart, brain, nerves, lungs, other parts of body or loss of life  Patient voiced understanding.)        Anesthesia Quick Evaluation

## 2020-08-12 NOTE — H&P (Signed)
Arlyss Repress, MD [address]  [address]  [place], [place] [address]  Main: [phone]  Fax: [phone] Pager: [phone]  Primary Care Physician:  Emogene Morgan, MD Primary Gastroenterologist:  Dr. Arlyss Repress  Pre-Procedure History & Physical: HPI:  Felicia Barnett is a 54 y.o. female is here for an endoscopy.   Past Medical History:  Diagnosis Date  . Anxiety    since surgery, pt has had attacks w/ fear of not being able to move her leg (anxiety attacks occur  3-4x month.)  . Diabetes mellitus without complication (HCC)    pre-diabetes  . H/O prior ablation treatment   . Heart murmur    palpatiations  . Hyperlipidemia   . Hypertension   . SVT (supraventricular tachycardia) (HCC)     Past Surgical History:  Procedure Laterality Date  . ABDOMINAL HYSTERECTOMY    . BREAST BIOPSY Right    neg  . cardial ablation     anxiety attack (heart palpitations HR=210 bpm) the day she was released from the hospital from surgery. Findings were neg .  Marland Kitchen COLONOSCOPY      Prior to Admission medications   Medication Sig Start Date End Date Taking? Authorizing Provider  Ascorbic Acid (VITAMIN C) 100 MG tablet Take 100 mg by mouth daily.    [provider]  aspirin 81 MG chewable tablet Chew by mouth. Patient not taking: Reported on 07/24/2020    [provider]  baclofen (LIORESAL) 10 MG tablet Take by mouth. 07/17/20 08/16/20  [provider]  chlorthalidone (HYGROTON) 25 MG tablet Take 25 mg by mouth every other day.     [provider]  cholecalciferol (VITAMIN D) 25 MCG (1000 UT) tablet Take 1,000 Units by mouth daily.    [provider]  Cyanocobalamin (VITAMIN B 12 PO) Take 1 mg by mouth every other day.     [provider]  famotidine (PEPCID) 20 MG tablet Take 20 mg by mouth 2 (two) times daily. 06/28/20   [provider]  Multiple Vitamins-Minerals (ZINC PO) Take by mouth every other day.     [provider]  nebivolol (BYSTOLIC) 5 MG tablet Take 5 mg by mouth 2 (two) times daily.     [provider]  NON FORMULARY Liver Detox 1 capsule qd.    [provider]  NON FORMULARY Blood sugar support Takes 1 capsule qd.    [provider]  pantoprazole (PROTONIX) 40 MG tablet TAKE 1 TABLET BY MOUTH DAILY FOR REFLUX/ HEARTBURN 30 MINUTES BEFORE FIRST MEAL Patient not taking: Reported on 07/24/2020 03/17/20   [provider]  Potassium Chloride ER 20 MEQ TBCR TAKE 1 TABLET BY MOUTH AS DIRECTED. TAKE 1 TABLET ONCE DAILY AND 2 TABLETS 2 DAYS A WEEK. 11/04/19   Antonieta Iba, MD  Probiotic Product (PROBIOTIC DAILY PO) Take by mouth.    [provider]  propranolol (INDERAL) 20 MG tablet Take 1 tablet (20 mg total) by mouth 3 (three) times daily as needed. For breakthrough palpitations. 10/23/19   Alver Sorrow, NP  rosuvastatin (CRESTOR) 5 MG tablet Take 1 tablet (5 mg total) by mouth at bedtime. 10/28/19   Alver Sorrow, NP  valACYclovir (VALTREX) 500 MG tablet Take 500 mg by mouth 2 (two) times daily as needed (for flares).     [provider]    Allergies as of 07/24/2020 - Review Complete 07/24/2020  Allergen Reaction Noted  . Iodinated diagnostic  agents Hives 06/04/2020    Family History  Problem Relation Age of Onset  . Heart disease Mother   . Diabetes Mother   . Hypertension Mother   . Heart attack Father   . Diabetes Father   . Hypertension Father   . Breast cancer Neg Hx     Social History   Socioeconomic History  . Marital status: Legally Separated    Spouse name: Not on file  . Number of children: Not on file  . Years of education: Not on file  . Highest education level: Not on file  Occupational History  . Not on file  Tobacco Use  . Smoking status: Former Smoker    Years: 20.00    Quit date: 03/02/2009    Years since quitting: 11.4  . Smokeless tobacco: Never Used  Vaping Use  . Vaping  Use: Never used  Substance and Sexual Activity  . Alcohol use: Yes    Comment: occ  . Drug use: No  . Sexual activity: Not on file  Other Topics Concern  . Not on file  Social History Narrative  . Not on file   Social Determinants of Health   Financial Resource Strain:   . Difficulty of Paying Living Expenses: Not on file  Food Insecurity:   . Worried About Programme researcher, broadcasting/film/video in the Last Year: Not on file  . Ran Out of Food in the Last Year: Not on file  Transportation Needs:   . Lack of Transportation (Medical): Not on file  . Lack of Transportation (Non-Medical): Not on file  Physical Activity:   . Days of Exercise per Week: Not on file  . Minutes of Exercise per Session: Not on file  Stress:   . Feeling of Stress : Not on file  Social Connections:   . Frequency of Communication with Friends and Family: Not on file  . Frequency of Social Gatherings with Friends and Family: Not on file  . Attends Religious Services: Not on file  . Active Member of Clubs or Organizations: Not on file  . Attends Banker Meetings: Not on file  . Marital Status: Not on file  Intimate Partner Violence:   . Fear of Current or Ex-Partner: Not on file  . Emotionally Abused: Not on file  . Physically Abused: Not on file  . Sexually Abused: Not on file    Review of Systems: See HPI, otherwise negative ROS  Physical Exam: BP (!) 141/89   Pulse 69   Temp 97.7 F (36.5 C) (Temporal)   Resp 15   Ht 5' (1.524 m)   Wt 80.3 kg   SpO2 100%   BMI 34.57 kg/m  General:   Alert,  pleasant and cooperative in NAD Head:  Normocephalic and atraumatic. Neck:  Supple; no masses or thyromegaly. Lungs:  Clear throughout to auscultation.    Heart:  Regular rate and rhythm. Abdomen:  Soft, nontender and nondistended. Normal bowel sounds, without guarding, and without rebound.   Neurologic:  Alert and  oriented x4;  grossly normal neurologically.  Impression/Plan: Felicia Barnett is  here for an endoscopy to be performed for chronic abdominal bloating,family h/o stomach cancer  Risks, benefits, limitations, and alternatives regarding  endoscopy have been reviewed with the patient.  Questions have been answered.  All parties agreeable.   Lannette Donath, MD  08/12/2020, 8:40 AM

## 2020-08-12 NOTE — Transfer of Care (Signed)
Immediate Anesthesia Transfer of Care Note  Patient: Felicia Barnett  Procedure(s) Performed: ESOPHAGOGASTRODUODENOSCOPY (EGD) WITH PROPOFOL (N/A )  Patient Location: Endoscopy Unit  Anesthesia Type:General  Level of Consciousness: sedated  Airway & Oxygen Therapy: Patient connected to nasal cannula oxygen  Post-op Assessment: Post -op Vital signs reviewed and stable  Post vital signs: stable  Last Vitals:  Vitals Value Taken Time  BP 119/79   Temp    Pulse 84 08/12/20 0903  Resp 16 08/12/20 0903  SpO2 96 % 08/12/20 0903  Vitals shown include unvalidated device data.  Last Pain:  Vitals:   08/12/20 0815  TempSrc: Temporal  PainSc: 0-No pain         Complications: No complications documented.

## 2020-08-12 NOTE — Anesthesia Postprocedure Evaluation (Signed)
Anesthesia Post Note  Patient: Felicia Barnett  Procedure(s) Performed: ESOPHAGOGASTRODUODENOSCOPY (EGD) WITH PROPOFOL (N/A )  Patient location during evaluation: Endoscopy Anesthesia Type: General Level of consciousness: awake and alert Pain management: pain level controlled Vital Signs Assessment: post-procedure vital signs reviewed and stable Respiratory status: spontaneous breathing, nonlabored ventilation, respiratory function stable and patient connected to nasal cannula oxygen Cardiovascular status: blood pressure returned to baseline and stable Postop Assessment: no apparent nausea or vomiting Anesthetic complications: no   No complications documented.   Last Vitals:  Vitals:   08/12/20 0913 08/12/20 0923  BP: 127/85 125/88  Pulse: 84 77  Resp: 16 18  Temp:    SpO2: 96% 96%    Last Pain:  Vitals:   08/12/20 0923  TempSrc:   PainSc: 0-No pain                 Cleda Mccreedy Garris Melhorn

## 2020-08-13 ENCOUNTER — Encounter: Payer: Self-pay | Admitting: Gastroenterology

## 2020-08-13 ENCOUNTER — Telehealth: Payer: Self-pay

## 2020-08-13 DIAGNOSIS — A048 Other specified bacterial intestinal infections: Secondary | ICD-10-CM

## 2020-08-13 LAB — SURGICAL PATHOLOGY

## 2020-08-13 MED ORDER — AMOXICILLIN 500 MG PO TABS: 1000.0000 mg | ORAL_TABLET | Freq: Two times a day (BID) | ORAL | 0 refills | Status: AC

## 2020-08-13 MED ORDER — OMEPRAZOLE 20 MG PO CPDR: 20.0000 mg | DELAYED_RELEASE_CAPSULE | Freq: Two times a day (BID) | ORAL | 0 refills | Status: DC

## 2020-08-13 MED ORDER — CLARITHROMYCIN 500 MG PO TABS: 500.0000 mg | ORAL_TABLET | Freq: Two times a day (BID) | ORAL | 0 refills | Status: AC

## 2020-08-13 NOTE — Telephone Encounter (Signed)
Called and left a message for call back  

## 2020-08-13 NOTE — Telephone Encounter (Signed)
-----   Message from Toney Reil, MD sent at 08/13/2020 11:22 AM EDT ----- Plz send her the prescription for triple therapy to treat H Pylori for 14days Stop protonix and other antacids  Omeprazole 20mg  BID Clarithromycin 500mg  BID Amoxicillin 1gm BID  Order H Pylori breath test in 4weeks after completing medication to confirm eradication. She should be off prilosec and H2 blocker atleast for 2weeks before the test  Thanks RV

## 2020-08-13 NOTE — Telephone Encounter (Signed)
Called and left a message for call back. Sent medications to the pharmacy and order labs for 6 weeks

## 2020-08-13 NOTE — Telephone Encounter (Signed)
Patient verbalized understanding of results  

## 2020-08-14 ENCOUNTER — Institutional Professional Consult (permissible substitution): Payer: 59 | Admitting: Internal Medicine

## 2020-09-08 ENCOUNTER — Telehealth: Payer: Self-pay

## 2020-09-08 MED ORDER — PANTOPRAZOLE SODIUM 40 MG PO TBEC: 40.0000 mg | DELAYED_RELEASE_TABLET | Freq: Two times a day (BID) | ORAL | 0 refills | Status: DC

## 2020-09-08 NOTE — Telephone Encounter (Signed)
Let's postpone the test for 2-weeks and see if she feels better  RV

## 2020-09-08 NOTE — Addendum Note (Signed)
Addended by: Radene Knee L on: 09/08/2020 10:32 AM   Modules accepted: Orders

## 2020-09-08 NOTE — Telephone Encounter (Signed)
Let's send protonix 40mg  BID before meals for 30days  RV

## 2020-09-08 NOTE — Telephone Encounter (Signed)
Patient is calling because for the last week and a half she has had horrible acid reflex. She states she finished the antibiotics for H pylori infection. She states she is nausea all the time, bloating, burning in throat and epigastric area. Patient is taking Pepcid 20mg  twice a day and it is not effective. Patient states she use to take pantoprazole and she felt like it help her acid reflex.

## 2020-09-08 NOTE — Telephone Encounter (Signed)
Does patient need the H pylori breath test or blood test since she is taking a PPI

## 2020-09-08 NOTE — Telephone Encounter (Signed)
Patient verbalized understanding of results. Patient states she will go pick up medication and start medication tomorrow. She will come in the office in 4 weeks to have the repeat H pylori breath test done.

## 2020-09-08 NOTE — Telephone Encounter (Signed)
Patient verbalized understanding of instructions  

## 2020-09-23 IMAGING — DX DG THORACIC SPINE 3V
3 series · 3 of 3 positions shown · non-contrast
Comparison: None.

CLINICAL DATA: Dorsalgia.  Recent fall

EXAM:
THORACIC SPINE - 3 VIEWS

[t-spine ap]
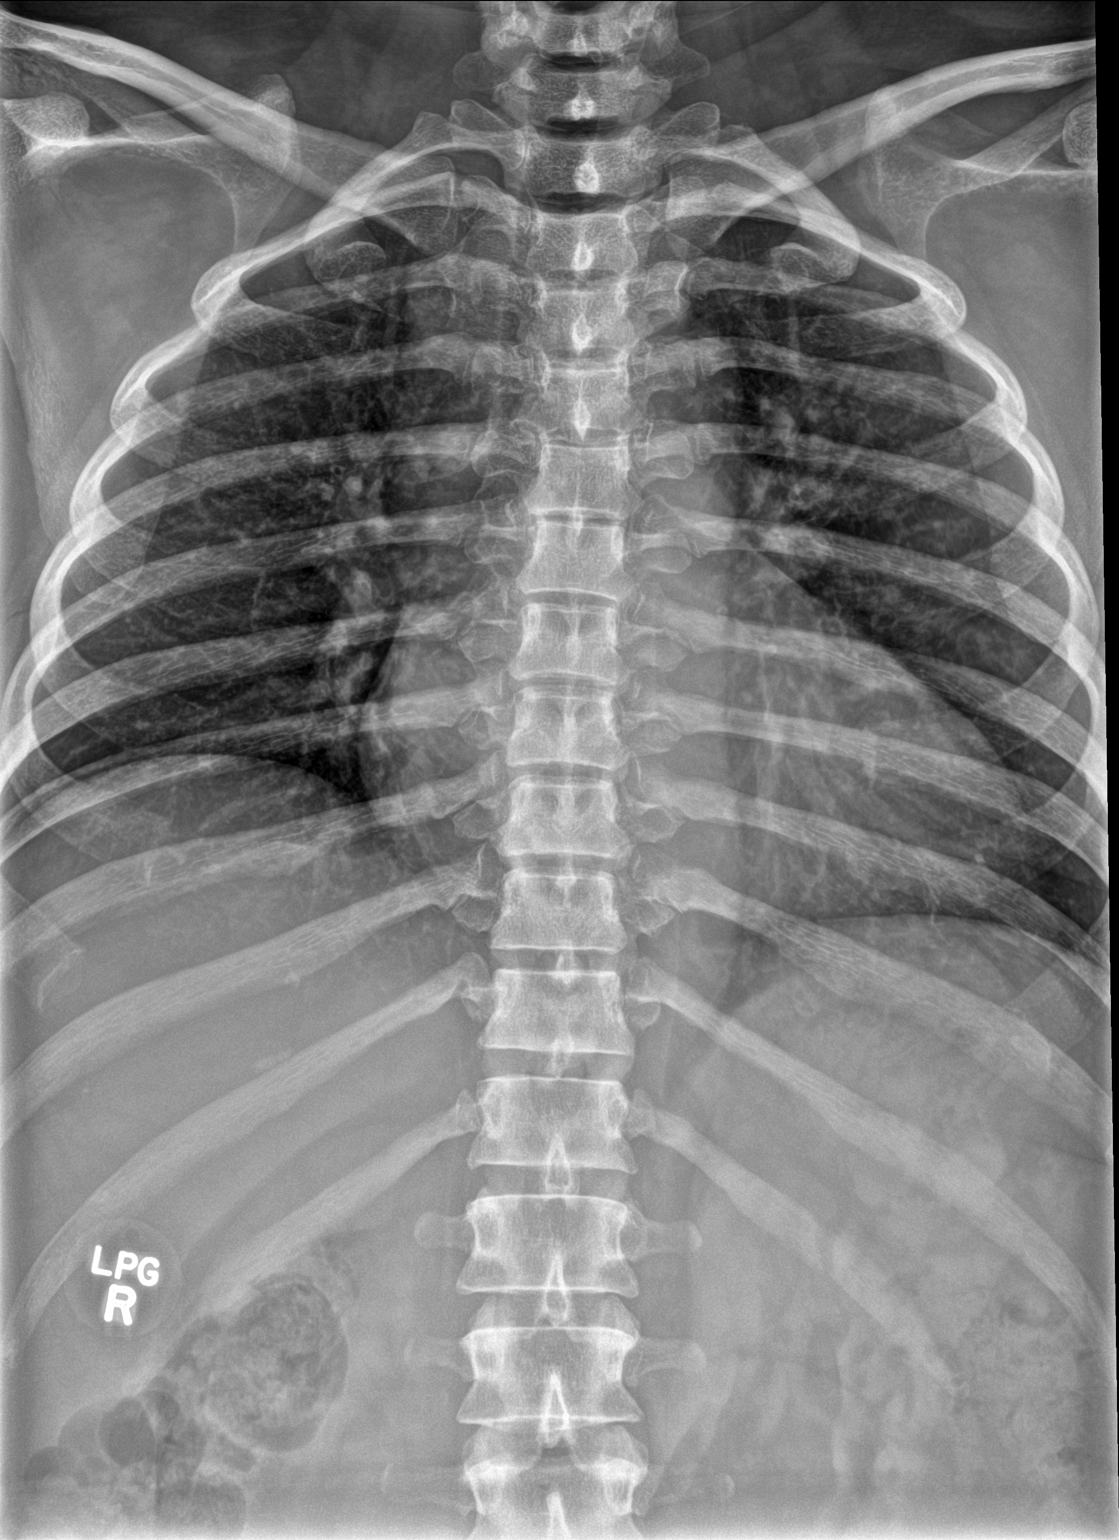

[t-spine lat]
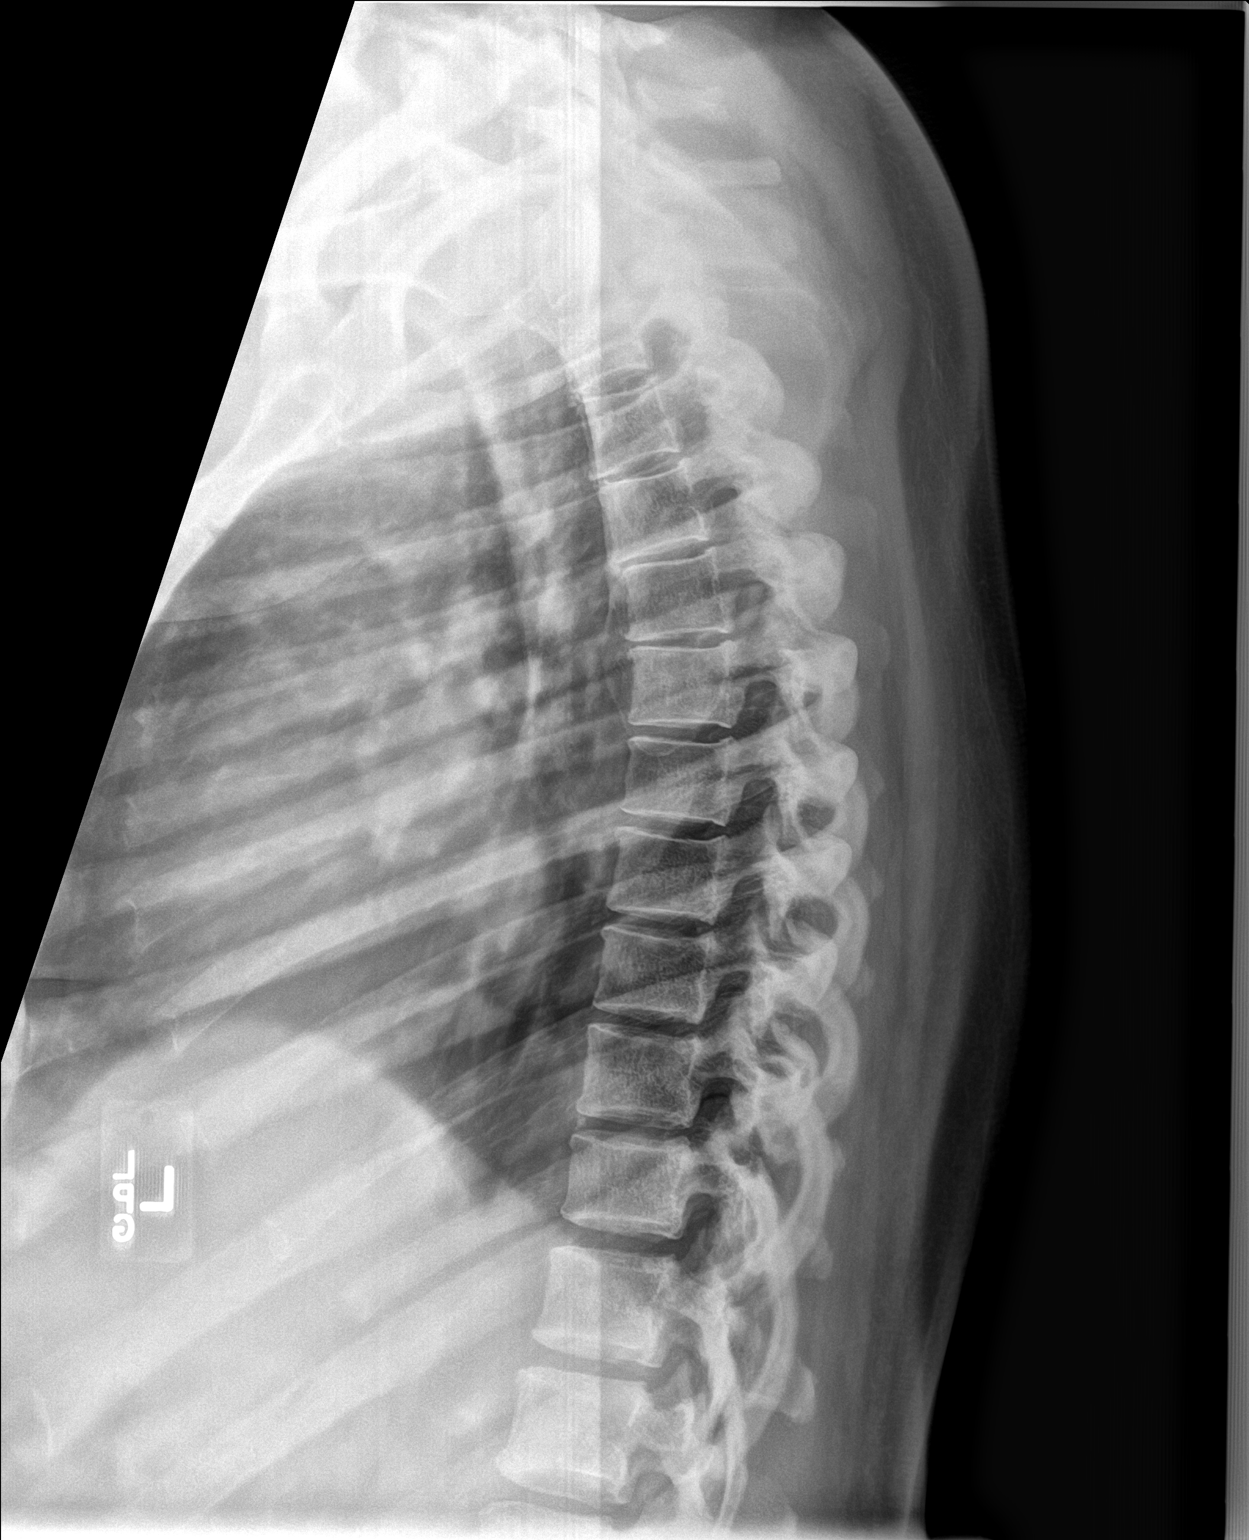

[t-spine swimmers]
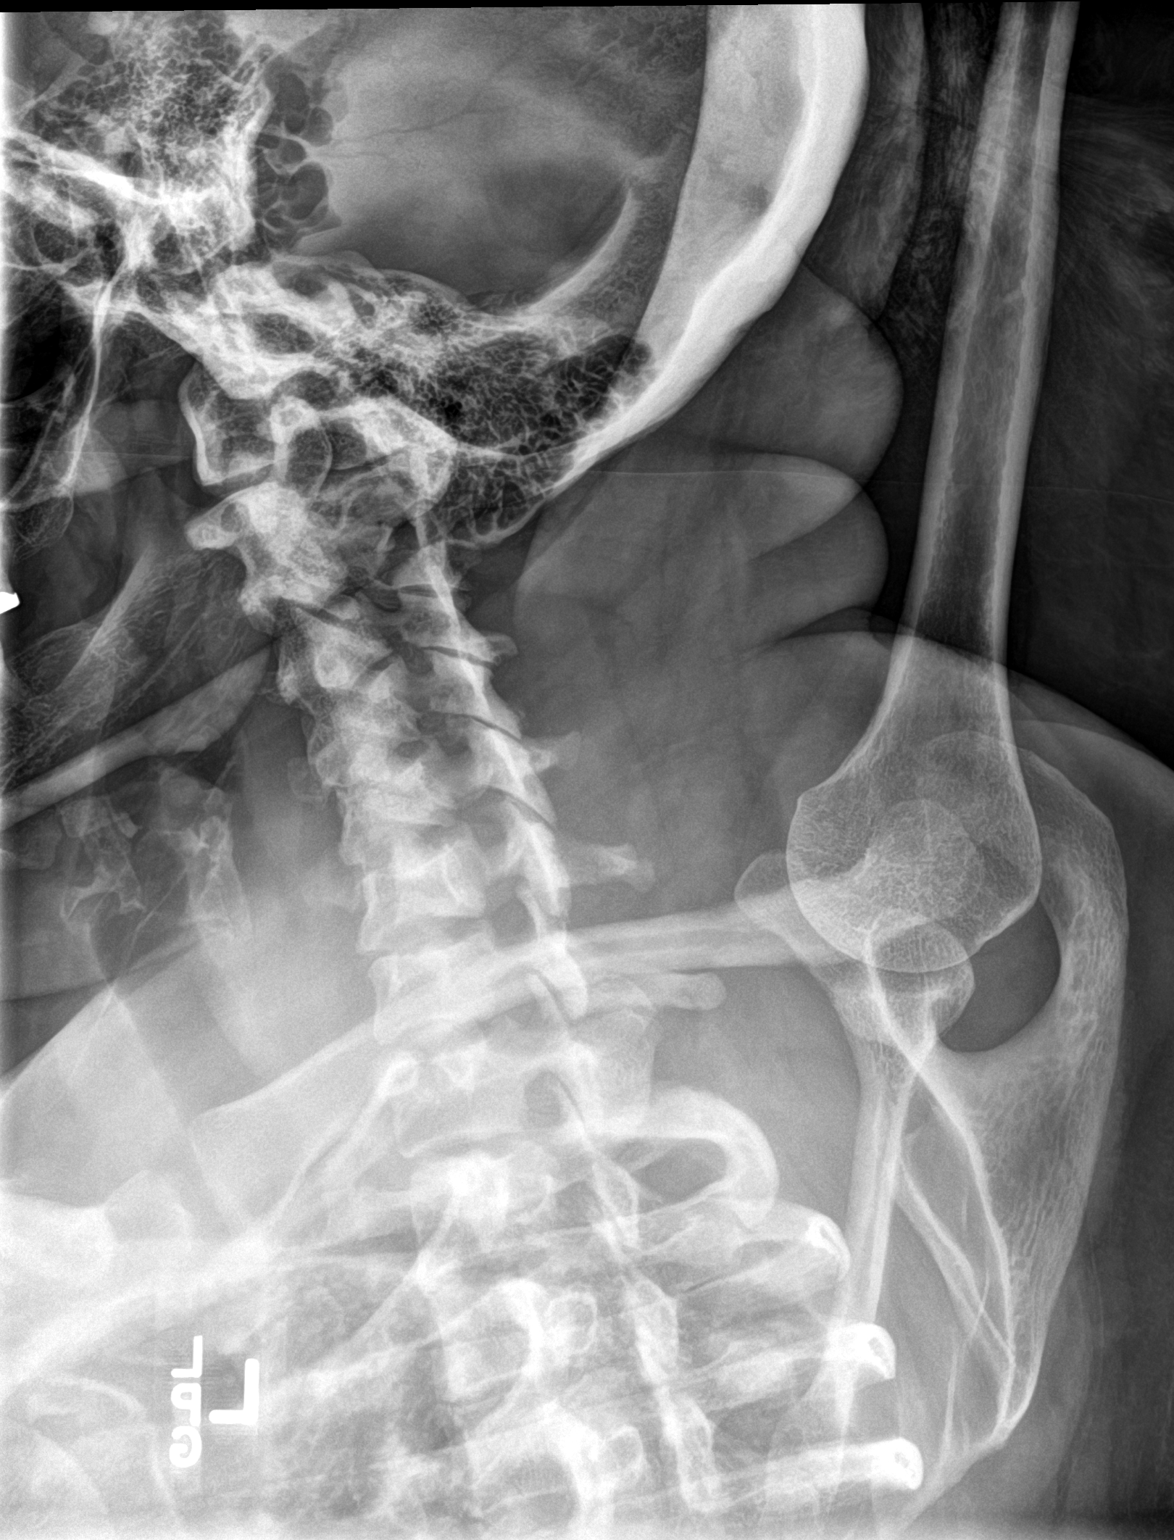

[3 of 3 positions shown; findings below may reference images not displayed]

FINDINGS: Frontal, lateral, and swimmer's views were obtained. There is no
appreciable fracture or spondylolisthesis. The disc spaces appear
unremarkable. No erosive change or paraspinous lesion. Visualized
lungs clear.
IMPRESSION: No fracture or spondylolisthesis. No appreciable arthropathic
change.

## 2020-09-30 ENCOUNTER — Other Ambulatory Visit: Payer: Self-pay | Admitting: Gastroenterology

## 2020-10-13 ENCOUNTER — Ambulatory Visit (INDEPENDENT_AMBULATORY_CARE_PROVIDER_SITE_OTHER): Payer: 59 | Admitting: Gastroenterology

## 2020-10-13 ENCOUNTER — Encounter: Payer: Self-pay | Admitting: Gastroenterology

## 2020-10-13 VITALS — BP 111/78 | HR 78 | Temp 97.9°F | Ht 60.0 in | Wt 180.5 lb

## 2020-10-13 DIAGNOSIS — K760 Fatty (change of) liver, not elsewhere classified: Secondary | ICD-10-CM | POA: Diagnosis not present

## 2020-10-13 DIAGNOSIS — R140 Abdominal distension (gaseous): Secondary | ICD-10-CM

## 2020-10-13 DIAGNOSIS — K5909 Other constipation: Secondary | ICD-10-CM | POA: Diagnosis not present

## 2020-10-13 DIAGNOSIS — A048 Other specified bacterial intestinal infections: Secondary | ICD-10-CM | POA: Diagnosis not present

## 2020-10-13 NOTE — Progress Notes (Signed)
Cephas Darby, MD [address]  [place]  [place], [place] [address]  Main: [phone]  Fax: [phone]    Gastroenterology Consultation  Referring Provider:     Donnie Coffin, MD Primary Care Physician:  Donnie Coffin, MD Primary Gastroenterologist:  Dr. Cephas Darby Reason for Consultation:     Chronic abdominal bloating        HPI:   Felicia Barnett is a 55 y.o. female referred by Dr. Donnie Coffin, MD  for consultation & management of chronic abdominal bloating.  Patient was diagnosed with H. pylori infection based on positive H. pylori stool antigen in 03/2020, s/p treatment for 2 weeks.  She has been experiencing severe abdominal bloating for several months which is not improved.  She had a history of constipation, which is partially relieved with eating flaxseed daily.  However, she does have to strain during bowel movement.  She is trying to follow healthy diet and incorporate some exercise, trying to lose weight.  She is taking Pepcid as needed for heartburn.  She is also found to have fatty liver as well as hiatal hernia which were incidentally found on the coronary calcium CT scan Patient is taking a lot of supplements including probiotics Patient denies smoking.  Admits to occasional alcohol use She works for [employer] in [place]  Follow-up visit 10/13/2020 Patient is treated for Helicobacter pylori infection with triple therapy for 14 days, diagnosed based on gastric biopsies.  Her symptoms have modestly improved post treatment.  She reported worsening of acid reflux, started her on Protonix 40 mg twice daily which helps.  She has been experiencing severe constipation, has been gaining weight.  Continues to have abdominal bloating and reflux  NSAIDs: None  Antiplts/Anticoagulants/Anti thrombotics: None  GI Procedures: Underwent colonoscopy at age 24, reportedly normal  Upper endoscopy 08/12/20 - Normal duodenal bulb and second  portion of the duodenum. - Erythematous mucosa in the gastric fundus and gastric body. Biopsied. - Normal incisura and antrum. Biopsied. - Salmon-colored mucosa suspicious for short-segment Barrett's esophagus. Biopsied. - Small hiatal hernia  DIAGNOSIS:  A. STOMACH, RANDOM; COLD BIOPSY:  - CHRONIC ACTIVE GASTRITIS WITH HELICOBACTER PYLORI TYPE ORGANISMS.  - NEGATIVE FOR DYSPLASIA AND MALIGNANCY.   B. GASTROESOPHAGEAL JUNCTION; COLD BIOPSY:  - SQUAMOUS MUCOSA WITH FEATURES OF REFLUX ESOPHAGITIS.  - NO INCREASE IN INTRAEPITHELIAL EOSINOPHILS (LESS THAN 2 PER HPF).  - NEGATIVE FOR DYSPLASIA AND MALIGNANCY.    Past Medical History:  Diagnosis Date  . Anxiety    since surgery, pt has had attacks w/ fear of not being able to move her leg (anxiety attacks occur  3-4x month.)  . Diabetes mellitus without complication (HCC)    pre-diabetes  . H/O prior ablation treatment   . Heart murmur    palpatiations  . Hyperlipidemia   . Hypertension   . SVT (supraventricular tachycardia) (HCC)     Past Surgical History:  Procedure Laterality Date  . ABDOMINAL HYSTERECTOMY    . BREAST BIOPSY Right    neg  . cardial ablation     anxiety attack (heart palpitations HR=210 bpm) the day she was released from the hospital from surgery. Findings were neg .  Marland Kitchen COLONOSCOPY    . ESOPHAGOGASTRODUODENOSCOPY (EGD) WITH PROPOFOL N/A 08/12/2020   Procedure: ESOPHAGOGASTRODUODENOSCOPY (EGD) WITH PROPOFOL;  Surgeon: Lin Landsman, MD;  Location: [facility];  Service: Gastroenterology;  Laterality: N/A;    Current Outpatient Medications:  .  Ascorbic  Acid (VITAMIN C) 100 MG tablet, Take 100 mg by mouth daily., Disp: , Rfl:  .  aspirin 81 MG chewable tablet, Chew by mouth., Disp: , Rfl:  .  chlorthalidone (HYGROTON) 25 MG tablet, Take 25 mg by mouth every other day. , Disp: , Rfl:  .  cholecalciferol (VITAMIN D) 25 MCG (1000 UT) tablet, Take 1,000 Units by mouth daily., Disp: , Rfl:  .   Cyanocobalamin (VITAMIN B 12 PO), Take 1 mg by mouth every other day. , Disp: , Rfl:  .  Multiple Vitamins-Minerals (ZINC PO), Take by mouth every other day., Disp: , Rfl:  .  nebivolol (BYSTOLIC) 5 MG tablet, Take 5 mg by mouth 2 (two) times daily. , Disp: , Rfl:  .  NON FORMULARY, Liver Detox 1 capsule qd., Disp: , Rfl:  .  NON FORMULARY, Blood sugar support Takes 1 capsule qd., Disp: , Rfl:  .  pantoprazole (PROTONIX) 40 MG tablet, TAKE 1 TABLET (40 MG TOTAL) BY MOUTH 2 (TWO) TIMES DAILY BEFORE A MEAL., Disp: 60 tablet, Rfl: 0 .  Potassium Chloride ER 20 MEQ TBCR, TAKE 1 TABLET BY MOUTH AS DIRECTED. TAKE 1 TABLET ONCE DAILY AND 2 TABLETS 2 DAYS A WEEK., Disp: 35 tablet, Rfl: 6 .  Probiotic Product (PROBIOTIC DAILY PO), Take by mouth., Disp: , Rfl:  .  propranolol (INDERAL) 20 MG tablet, Take 1 tablet (20 mg total) by mouth 3 (three) times daily as needed. For breakthrough palpitations., Disp: 90 tablet, Rfl: 3 .  valACYclovir (VALTREX) 500 MG tablet, Take 500 mg by mouth 2 (two) times daily as needed (for flares). , Disp: , Rfl:  .  rosuvastatin (CRESTOR) 5 MG tablet, Take 1 tablet (5 mg total) by mouth at bedtime. (Patient not taking: Reported on 10/13/2020), Disp: 90 tablet, Rfl: 3   Family History  Problem Relation Age of Onset  . Heart disease Mother   . Diabetes Mother   . Hypertension Mother   . Heart attack Father   . Diabetes Father   . Hypertension Father   . Breast cancer Neg Hx      Social History   Tobacco Use  . Smoking status: Former Smoker    Years: 20.00    Quit date: 03/02/2009    Years since quitting: 11.6  . Smokeless tobacco: Never Used  Vaping Use  . Vaping Use: Never used  Substance Use Topics  . Alcohol use: Yes    Comment: occ  . Drug use: No    Allergies as of 10/13/2020 - Review Complete 10/13/2020  Allergen Reaction Noted  . Iodinated diagnostic agents Hives 06/04/2020    Review of Systems:    All systems reviewed and negative except where  noted in HPI.   Physical Exam:  BP 111/78 (BP Location: Left Arm, Patient Position: Sitting, Cuff Size: Normal)   Pulse 78   Temp 97.9 F (36.6 C) (Oral)   Ht 5' (1.524 m)   Wt 180 lb 8 oz (81.9 kg)   BMI 35.25 kg/m  No LMP recorded. Patient has had a hysterectomy.  General:   Alert,  Well-developed, well-nourished, pleasant and cooperative in NAD Head:  Normocephalic and atraumatic. Eyes:  Sclera clear, no icterus.   Conjunctiva pink. Ears:  Normal auditory acuity. Nose:  No deformity, discharge, or lesions. Mouth:  No deformity or lesions,oropharynx pink & moist. Neck:  Supple; no masses or thyromegaly. Lungs:  Respirations even and unlabored.  Clear throughout to auscultation.   No wheezes, crackles,  or rhonchi. No acute distress. Heart:  Regular rate and rhythm; no murmurs, clicks, rubs, or gallops. Abdomen:  Normal bowel sounds. Soft, non-tender and moderately distended, tympanic to percussion without masses, hepatosplenomegaly or hernias noted.  No guarding or rebound tenderness.   Rectal: Not performed Msk:  Symmetrical without gross deformities. Good, equal movement & strength bilaterally. Pulses:  Normal pulses noted. Extremities:  No clubbing or edema.  No cyanosis. Neurologic:  Alert and oriented x3;  grossly normal neurologically. Skin:  Intact without significant lesions or rashes. No jaundice. Psych:  Alert and cooperative. Normal mood and affect.  Imaging Studies: Reviewed  Assessment and Plan:   Felicia Barnett is a 55 y.o. Hispanic female from [place] with obesity, metabolic syndrome is seen in consultation for chronic abdominal bloating and constipation, history of H. pylori infection s/p treatment, hiatal hernia and fatty liver  H. pylori infection leading to dyspepsia S/p treatment with triple therapy Advised patient to stop Protonix for 2 weeks, undergo H. pylori breath test, treat with pylori if positive  Chronic constipation and abdominal  bloating Reiterated on high-fiber diet, information provided Recommended to take MiraLAX 1-2 times daily along with fiber supplement Patient switched her job more recently which involves physical activity Reiterated on healthy diet and exercise  Fatty liver: LFTs are normal Discussed in length regarding healthy diet and exercise, weight loss to prevent progression of fatty liver, control of diabetes Monitor LFTs annually   Follow up in 3 months   Arlyss Repress, MD

## 2020-10-13 NOTE — Patient Instructions (Addendum)
Come back in 2 weeks for lab test  High-Fiber Diet Fiber, also called dietary fiber, is a type of carbohydrate that is found in fruits, vegetables, whole grains, and beans. A high-fiber diet can have many health benefits. Your health care provider may recommend a high-fiber diet to help:  Prevent constipation. Fiber can make your bowel movements more regular.  Lower your cholesterol.  Relieve the following conditions: ? Swelling of veins in the anus (hemorrhoids). ? Swelling and irritation (inflammation) of specific areas of the digestive tract (uncomplicated diverticulosis). ? A problem of the large intestine (colon) that sometimes causes pain and diarrhea (irritable bowel syndrome, IBS).  Prevent overeating as part of a weight-loss plan.  Prevent heart disease, type 2 diabetes, and certain cancers. What is my plan? The recommended daily fiber intake in grams (g) includes:  38 g for men age 57 or younger.  30 g for men over age 57.  25 g for women age 19 or younger.  21 g for women over age 57. You can get the recommended daily intake of dietary fiber by:  Eating a variety of fruits, vegetables, grains, and beans.  Taking a fiber supplement, if it is not possible to get enough fiber through your diet. What do I need to know about a high-fiber diet?  It is better to get fiber through food sources rather than from fiber supplements. There is not a lot of research about how effective supplements are.  Always check the fiber content on the nutrition facts label of any prepackaged food. Look for foods that contain 5 g of fiber or more per serving.  Talk with a diet and nutrition specialist (dietitian) if you have questions about specific foods that are recommended or not recommended for your medical condition, especially if those foods are not listed below.  Gradually increase how much fiber you consume. If you increase your intake of dietary fiber too quickly, you may have  bloating, cramping, or gas.  Drink plenty of water. Water helps you to digest fiber. What are tips for following this plan?  Eat a wide variety of high-fiber foods.  Make sure that half of the grains that you eat each day are whole grains.  Eat breads and cereals that are made with whole-grain flour instead of refined flour or white flour.  Eat brown rice, bulgur wheat, or millet instead of white rice.  Start the day with a breakfast that is high in fiber, such as a cereal that contains 5 g of fiber or more per serving.  Use beans in place of meat in soups, salads, and pasta dishes.  Eat high-fiber snacks, such as berries, raw vegetables, nuts, and popcorn.  Choose whole fruits and vegetables instead of processed forms like juice or sauce. What foods can I eat?  Fruits Berries. Pears. Apples. Oranges. Avocado. Prunes and raisins. Dried figs. Vegetables Sweet potatoes. Spinach. Kale. Artichokes. Cabbage. Broccoli. Cauliflower. Green peas. Carrots. Squash. Grains Whole-grain breads. Multigrain cereal. Oats and oatmeal. Brown rice. Barley. Bulgur wheat. Millet. Quinoa. Bran muffins. Popcorn. Rye wafer crackers. Meats and other proteins Navy, kidney, and pinto beans. Soybeans. Split peas. Lentils. Nuts and seeds. Dairy Fiber-fortified yogurt. Beverages Fiber-fortified soy milk. Fiber-fortified orange juice. Other foods Fiber bars. The items listed above may not be a complete list of recommended foods and beverages. Contact a dietitian for more options. What foods are not recommended? Fruits Fruit juice. Cooked, strained fruit. Vegetables Fried potatoes. Canned vegetables. Well-cooked vegetables. Grains White bread. Pasta  made with refined flour. White rice. Meats and other proteins Fatty cuts of meat. Fried chicken or fried fish. Dairy Milk. Yogurt. Cream cheese. Sour cream. Fats and oils Butters. Beverages Soft drinks. Other foods Cakes and pastries. The items  listed above may not be a complete list of foods and beverages to avoid. Contact a dietitian for more information. Summary  Fiber is a type of carbohydrate. It is found in fruits, vegetables, whole grains, and beans.  There are many health benefits of eating a high-fiber diet, such as preventing constipation, lowering blood cholesterol, helping with weight loss, and reducing your risk of heart disease, diabetes, and certain cancers.  Gradually increase your intake of fiber. Increasing too fast can result in cramping, bloating, and gas. Drink plenty of water while you increase your fiber.  The best sources of fiber include whole fruits and vegetables, whole grains, nuts, seeds, and beans. This information is not intended to replace advice given to you by your health care provider. Make sure you discuss any questions you have with your health care provider. Document Revised: 07/31/2017 Document Reviewed: 07/31/2017 Elsevier Patient Education  2020 [employer].

## 2020-10-15 ENCOUNTER — Other Ambulatory Visit: Payer: Self-pay | Admitting: Gastroenterology

## 2020-10-29 LAB — H. PYLORI BREATH TEST: H pylori Breath Test: POSITIVE — AB

## 2020-10-30 ENCOUNTER — Other Ambulatory Visit: Payer: Self-pay

## 2020-10-30 ENCOUNTER — Telehealth: Payer: Self-pay

## 2020-10-30 DIAGNOSIS — A048 Other specified bacterial intestinal infections: Secondary | ICD-10-CM

## 2020-10-30 MED ORDER — PYLERA 140-125-125 MG PO CAPS: 3.0000 | ORAL_CAPSULE | Freq: Three times a day (TID) | ORAL | 0 refills | Status: DC

## 2020-10-30 MED ORDER — OMEPRAZOLE 20 MG PO CPDR: 20.0000 mg | DELAYED_RELEASE_CAPSULE | Freq: Two times a day (BID) | ORAL | 0 refills | Status: DC

## 2020-10-30 NOTE — Telephone Encounter (Signed)
Patient verbalized understanding and will pick up medications at the pharmacy

## 2020-10-30 NOTE — Progress Notes (Signed)
Per Dr. Allegra Lai she needed to do omeprazole 20mg  bID with the antibiotics. Patient was informed

## 2020-10-30 NOTE — Telephone Encounter (Signed)
-----   Message from Toney Reil, MD sent at 10/29/2020  4:51 PM EST ----- Recommend treatment with Pylera and repeat H. pylori breath test a month after completion of treatment  Thanks RV

## 2020-10-30 NOTE — Telephone Encounter (Signed)
Sent medication to the pharmacy and order H pylori test for 6 weeks.

## 2020-11-10 ENCOUNTER — Ambulatory Visit: Payer: 59 | Admitting: Gastroenterology

## 2021-01-21 ENCOUNTER — Ambulatory Visit: Payer: 59 | Admitting: Gastroenterology

## 2021-01-31 ENCOUNTER — Encounter: Payer: Self-pay | Admitting: Emergency Medicine

## 2021-01-31 ENCOUNTER — Emergency Department
Admission: EM | Admit: 2021-01-31 | Discharge: 2021-01-31 | Disposition: A | Discharge status: Home / Self Care | Payer: BC Managed Care – PPO | Attending: Emergency Medicine | Admitting: Emergency Medicine

## 2021-01-31 DIAGNOSIS — I10 Essential (primary) hypertension: Secondary | ICD-10-CM | POA: Insufficient documentation

## 2021-01-31 DIAGNOSIS — R197 Diarrhea, unspecified: Secondary | ICD-10-CM

## 2021-01-31 DIAGNOSIS — Z79899 Other long term (current) drug therapy: Secondary | ICD-10-CM | POA: Insufficient documentation

## 2021-01-31 DIAGNOSIS — E119 Type 2 diabetes mellitus without complications: Secondary | ICD-10-CM | POA: Diagnosis not present

## 2021-01-31 DIAGNOSIS — R002 Palpitations: Secondary | ICD-10-CM | POA: Diagnosis not present

## 2021-01-31 DIAGNOSIS — Z7982 Long term (current) use of aspirin: Secondary | ICD-10-CM | POA: Insufficient documentation

## 2021-01-31 DIAGNOSIS — Z87891 Personal history of nicotine dependence: Secondary | ICD-10-CM | POA: Insufficient documentation

## 2021-01-31 LAB — COMPREHENSIVE METABOLIC PANEL
ALT: 24 U/L (ref 0–44)
AST: 18 U/L (ref 15–41)
Albumin: 4.1 g/dL (ref 3.5–5.0)
Alkaline Phosphatase: 53 U/L (ref 38–126)
Anion gap: 8 (ref 5–15)
BUN: 15 mg/dL (ref 6–20)
CO2: 26 mmol/L (ref 22–32)
Calcium: 9.2 mg/dL (ref 8.9–10.3)
Chloride: 105 mmol/L (ref 98–111)
Creatinine, Ser: 0.7 mg/dL (ref 0.44–1.00)
GFR, Estimated: >60 mL/min (ref >60)
Glucose, Bld: 115 mg/dL — ABNORMAL HIGH (ref 70–99)
Potassium: 3.8 mmol/L (ref 3.5–5.1)
Sodium: 139 mmol/L (ref 135–145)
Total Bilirubin: 0.6 mg/dL (ref 0.3–1.2)
Total Protein: 7.3 g/dL (ref 6.5–8.1)

## 2021-01-31 LAB — CBC
HCT: 36.7 % (ref 36.0–46.0)
Hemoglobin: 12.7 g/dL (ref 12.0–15.0)
MCH: 30.5 pg (ref 26.0–34.0)
MCHC: 34.6 g/dL (ref 30.0–36.0)
MCV: 88 fL (ref 80.0–100.0)
Platelets: 219 10*3/uL (ref 150–400)
RBC: 4.17 MIL/uL (ref 3.87–5.11)
RDW: 12.7 % (ref 11.5–15.5)
WBC: 6.6 10*3/uL (ref 4.0–10.5)
nRBC: 0 % (ref 0.0–0.2)

## 2021-01-31 LAB — TROPONIN I (HIGH SENSITIVITY)
Troponin I (High Sensitivity): 3 ng/L (ref <18)
Troponin I (High Sensitivity): 3 ng/L (ref <18)

## 2021-01-31 LAB — LIPASE, BLOOD: Lipase: 50 U/L (ref 11–51)

## 2021-01-31 NOTE — ED Provider Notes (Signed)
[facility] Emergency Department Provider Note  ____________________________________________   Event Date/Time   First MD Initiated Contact with Patient 01/31/21 [id number]     (approximate)  I have reviewed the triage vital signs and the nursing notes.   HISTORY  Chief Complaint No chief complaint on file.    HPI Felicia Barnett is a 55 y.o. female with medical history as listed below which notably includes tachycardia for which she takes medication and a relatively recent diagnosis of H. pylori for which she is still undergoing treatment.  She presents  for evaluation of multiple episodes of diarrhea as well as waking up early this morning feeling chills and heart palpitations.  She had some pressure in the left side of her neck that has since gone away.  She had a little bit of abdominal pain with some initial diarrhea but the pain is gone away although she had another episode of diarrhea.  She reports that for the last 6 days she has been fasting in the morning but will eat 1 in the afternoon or evening.  She is trying to still drink plenty of water.  She frequently has a burning sensation in her abdomen and completed 1 course of antibiotics but was told by her gastroenterologist that she still has H. pylori and needs to do another longer course of antibiotics.  She currently feels better now and is not having any palpitations.  She never had chest pain but she had some pressure in the left side of her neck that made her concerned.  She has no known history of heart problems.  She has been taking her medications.  Nothing particular made her symptoms better or worse.        Past Medical History:  Diagnosis Date  . Anxiety    since surgery, pt has had attacks w/ fear of not being able to move her leg (anxiety attacks occur  3-4x month.)  . Diabetes mellitus without complication (HCC)    pre-diabetes  . H/O prior ablation treatment   . Heart murmur     palpatiations  . Hyperlipidemia   . Hypertension   . SVT (supraventricular tachycardia) [facility])     Patient Active Problem List   Diagnosis Date Noted  . History of Helicobacter pylori infection   . Paroxysmal tachycardia (HCC) 12/24/2018  . Chest pain 12/24/2018  . Palpitations 12/24/2018  . Essential hypertension 12/24/2018  . Low potassium syndrome 12/24/2018  . PVC's (premature ventricular contractions) 12/24/2018  . Other fatigue 12/24/2018  . Lower extremity neuropathy 08/07/2015  . Paresthesia of left leg 07/14/2015  . Anxiety 10/29/2013  . Hyperlipidemia 10/29/2013    Past Surgical History:  Procedure Laterality Date  . ABDOMINAL HYSTERECTOMY    . BREAST BIOPSY Right    neg  . cardial ablation     anxiety attack (heart palpitations HR=210 bpm) the day she was released from the hospital from surgery. Findings were neg .  [facility] COLONOSCOPY    . ESOPHAGOGASTRODUODENOSCOPY (EGD) WITH PROPOFOL N/A 08/12/2020   Procedure: ESOPHAGOGASTRODUODENOSCOPY (EGD) WITH PROPOFOL;  Surgeon: Toney Reil, MD;  Location[facility] ENDOSCOPY;  Service: Gastroenterology;  Laterality: N/A;    Prior to Admission medications   Medication Sig Start Date End Date Taking? Authorizing Provider  Ascorbic Acid (VITAMIN C) 100 MG tablet Take 100 mg by mouth daily.    [provider]  aspirin 81 MG chewable tablet Chew by mouth.    [provider]  bismuth-metronidazole-tetracycline [facility])  140-125-125 MG capsule Take 3 capsules by mouth 4 (four) times daily -  before meals and at bedtime for 14 days. 10/30/20 11/13/20  Toney Reil, MD  chlorthalidone (HYGROTON) 25 MG tablet Take 25 mg by mouth every other day.     [provider]  cholecalciferol (VITAMIN D) 25 MCG (1000 UT) tablet Take 1,000 Units by mouth daily.    [provider]  Cyanocobalamin (VITAMIN B 12 PO) Take 1 mg by mouth every other day.     [provider]  Multiple Vitamins-Minerals  (ZINC PO) Take by mouth every other day.    [provider]  nebivolol (BYSTOLIC) 5 MG tablet Take 5 mg by mouth 2 (two) times daily.     [provider]  NON FORMULARY Liver Detox 1 capsule qd.    [provider]  NON FORMULARY Blood sugar support Takes 1 capsule qd.    [provider]  omeprazole (PRILOSEC) 20 MG capsule Take 1 capsule (20 mg total) by mouth 2 (two) times daily before a meal for 14 days. 10/30/20 11/13/20  Toney Reil, MD  pantoprazole (PROTONIX) 40 MG tablet TAKE 1 TABLET (40 MG TOTAL) BY MOUTH 2 (TWO) TIMES DAILY BEFORE A MEAL. 10/16/20 11/15/20  Toney Reil, MD  Potassium Chloride ER 20 MEQ TBCR TAKE 1 TABLET BY MOUTH AS DIRECTED. TAKE 1 TABLET ONCE DAILY AND 2 TABLETS 2 DAYS A WEEK. 11/04/19   Antonieta Iba, MD  Probiotic Product (PROBIOTIC DAILY PO) Take by mouth.    [provider]  propranolol (INDERAL) 20 MG tablet Take 1 tablet (20 mg total) by mouth 3 (three) times daily as needed. For breakthrough palpitations. 10/23/19   Alver Sorrow, NP  rosuvastatin (CRESTOR) 5 MG tablet Take 1 tablet (5 mg total) by mouth at bedtime. Patient not taking: Reported on 10/13/2020 10/28/19   Alver Sorrow, NP  valACYclovir (VALTREX) 500 MG tablet Take 500 mg by mouth 2 (two) times daily as needed (for flares).     [provider]    Allergies Iodinated diagnostic agents  Family History  Problem Relation Age of Onset  . Heart disease Mother   . Diabetes Mother   . Hypertension Mother   . Heart attack Father   . Diabetes Father   . Hypertension Father   . Breast cancer Neg Hx     Social History Social History   Tobacco Use  . Smoking status: Former Smoker    Years: 20.00    Quit date: 03/02/2009    Years since quitting: 11.9  . Smokeless tobacco: Never Used  Vaping Use  . Vaping Use: Never used  Substance Use Topics  . Alcohol use: Yes    Comment: occ  . Drug use: No    Review of  Systems Constitutional: No fever/chills Eyes: No visual changes. ENT: No sore throat. Cardiovascular: Palpitation left-sided neck pressure. Respiratory: Denies shortness of breath. Gastrointestinal: Some burning abdominal pain with diarrhea.  No vomiting. Genitourinary: Negative for dysuria. Musculoskeletal: Negative for neck pain.  Negative for back pain. Integumentary: Negative for rash. Neurological: Negative for headaches, focal weakness or numbness.   ____________________________________________   PHYSICAL EXAM:  VITAL SIGNS: ED Triage Vitals  Enc Vitals Group     BP 01/31/21 0401 (!) 160/91     Pulse Rate 01/31/21 0358 69     Resp 01/31/21 0358 17     Temp 01/31/21 0358 97.9 F (36.6 C)  Temp Source 01/31/21 0358 Oral     SpO2 01/31/21 0358 100 %     Weight --      Height --      Head Circumference --      Peak Flow --      Pain Score 01/31/21 0547 0     Pain Loc --      Pain Edu? --      Excl. in GC? --     Constitutional: Alert and oriented.  Eyes: Conjunctivae are normal.  Head: Atraumatic. Nose: No congestion/rhinnorhea. Mouth/Throat: Patient is wearing a mask. Neck: No stridor.  No meningeal signs.   Cardiovascular: Normal rate, regular rhythm. Good peripheral circulation. Respiratory: Normal respiratory effort.  No retractions. Gastrointestinal: Soft and nontender. No distention.  Musculoskeletal: No lower extremity tenderness nor edema. No gross deformities of extremities. Neurologic:  Normal speech and language. No gross focal neurologic deficits are appreciated.  Skin:  Skin is warm, dry and intact. Psychiatric: Mood and affect are normal. Speech and behavior are normal.  ____________________________________________   LABS (all labs ordered are listed, but only abnormal results are displayed)  Labs Reviewed  COMPREHENSIVE METABOLIC PANEL - Abnormal; Notable for the following components:      Result Value   Glucose, Bld 115 (*)    All  other components within normal limits  LIPASE, BLOOD  CBC  URINALYSIS, COMPLETE (UACMP) WITH MICROSCOPIC  TROPONIN I (HIGH SENSITIVITY)  TROPONIN I (HIGH SENSITIVITY)   ____________________________________________  EKG  ED ECG REPORT I, Loleta Roseory Kylin Dubs, the attending physician, personally viewed and interpreted this ECG.  Date: 01/31/2021 EKG Time: 3:57 AM Rate: 69 Rhythm: normal sinus rhythm QRS Axis: Borderline left axis deviation Intervals: normal ST/T Wave abnormalities: Non-specific ST segment / T-wave changes, but no clear evidence of acute ischemia. Narrative Interpretation: no definitive evidence of acute ischemia; does not meet STEMI criteria.   ____________________________________________  RADIOLOGY I, Loleta Roseory Jeannie Mallinger, personally viewed and evaluated these images (plain radiographs) as part of my medical decision making, as well as reviewing the written report by the radiologist.  ED MD interpretation: No indication for emergent imaging  Official radiology report(s): No results found.  ____________________________________________   PROCEDURES   Procedure(s) performed (including Critical Care):  Procedures   ____________________________________________   INITIAL IMPRESSION / MDM / ASSESSMENT AND PLAN / ED COURSE  As part of my medical decision making, I reviewed the following data within the electronic MEDICAL RECORD NUMBER Nursing notes reviewed and incorporated, Labs reviewed , Old chart reviewed and Notes from prior ED visits   Differential diagnosis includes, but is not limited to, SVT, ACS, H. pylori complication, acid reflux, pancreatitis, biliary colic, nonspecific viral illness.  Patient is well-appearing in no distress.  Vital signs are stable.  No abdominal tenderness palpation.  Symptoms have improved.  Her CMP, CBC, and lipase are all within normal limits.  Her initial high-sensitivity troponin is 10.  Very low suspicion for ACS.  No urinary symptoms.   We discussed it and the patient is comfortable going home if her second troponin is negative.  She is low risk for ACS based on HEAR score and I think it is very reasonable to check a second troponin and then discharged home.  No imaging required. She understands and agrees with the plan.       Clinical Course as of 01/31/21 0847  Sun Jan 31, 2021  [id number] Repeat high-sensitivity troponin is 3.  Patient is appropriate for discharge and outpatient follow-up.  I gave my usual and customary return precautions. [CF]    Clinical Course User Index [CF] Loleta Rose, MD     ____________________________________________  FINAL CLINICAL IMPRESSION(S) / ED DIAGNOSES  Final diagnoses:  Diarrhea of presumed infectious origin  Palpitations     MEDICATIONS GIVEN DURING THIS VISIT:  Medications - No data to display   ED Discharge Orders    None      *Please note:  NYALA KIRCHNER was evaluated in Emergency Department on 01/31/2021 for the symptoms described in the history of present illness. She was evaluated in the context of the global COVID-19 pandemic, which necessitated consideration that the patient might be at risk for infection with the SARS-CoV-2 virus that causes COVID-19. Institutional protocols and algorithms that pertain to the evaluation of patients at risk for COVID-19 are in a state of rapid change based on information released by regulatory bodies including the CDC and federal and state organizations. These policies and algorithms were followed during the patient's care in the ED.  Some ED evaluations and interventions may be delayed as a result of limited staffing during and after the pandemic.*  Note:  This document was prepared using Dragon voice recognition software and may include unintentional dictation errors.   Loleta Rose, MD 01/31/21 [phone]

## 2021-01-31 NOTE — ED Notes (Signed)
Unable to sign d/c signature due to failed topaz. Verbalized understanding of d/c instructions.

## 2021-01-31 NOTE — ED Triage Notes (Signed)
Pt c/o waking this AM in a cold sweat with heart palpitations and abdominal pain with 1 episode of diarrhea. Pt reports having "full" feeling to the left side of neck that radiates into the left arm.   Pt started fasting 16 hours on Monday.

## 2021-01-31 NOTE — Discharge Instructions (Signed)
Your workup in the Emergency Department today was reassuring.  We did not find any specific abnormalities.  We recommend you drink plenty of fluids, take your regular medications and/or any new ones prescribed today, and follow up with the doctor(s) listed in these documents as recommended.  Return to the Emergency Department if you develop new or worsening symptoms that concern you.  

## 2021-03-22 ENCOUNTER — Other Ambulatory Visit: Payer: Self-pay

## 2021-03-22 ENCOUNTER — Encounter: Payer: Self-pay | Admitting: Gastroenterology

## 2021-03-22 ENCOUNTER — Ambulatory Visit (INDEPENDENT_AMBULATORY_CARE_PROVIDER_SITE_OTHER): Payer: BLUE CROSS/BLUE SHIELD | Admitting: Gastroenterology

## 2021-03-22 VITALS — BP 121/80 | HR 86 | Temp 98.1°F | Ht 60.0 in | Wt 175.5 lb

## 2021-03-22 DIAGNOSIS — A048 Other specified bacterial intestinal infections: Secondary | ICD-10-CM | POA: Diagnosis not present

## 2021-03-22 DIAGNOSIS — K5904 Chronic idiopathic constipation: Secondary | ICD-10-CM

## 2021-03-22 DIAGNOSIS — K760 Fatty (change of) liver, not elsewhere classified: Secondary | ICD-10-CM

## 2021-03-22 MED ORDER — PYLERA 140-125-125 MG PO CAPS: 3.0000 | ORAL_CAPSULE | Freq: Three times a day (TID) | ORAL | 0 refills | Status: DC

## 2021-03-22 NOTE — Patient Instructions (Signed)
Gave Linzess 145 samples to take 1 tablet every morning

## 2021-03-22 NOTE — Progress Notes (Signed)
Arlyss Repress, MD [address]  [address]  [place], [place] [address]  Main: [phone]  Fax: [phone]    Gastroenterology Consultation  Referring Provider:     Emogene Morgan, MD Primary Care Physician:  Emogene Morgan, MD Primary Gastroenterologist:  Dr. Arlyss Repress Reason for Consultation:     Chronic abdominal bloating        HPI:   Felicia Barnett is a 55 y.o. female referred by Dr. Emogene Morgan, MD  for consultation & management of chronic abdominal bloating.  Patient was diagnosed with H. pylori infection based on positive H. pylori stool antigen in 03/2020, s/p treatment for 2 weeks.  She has been experiencing severe abdominal bloating for several months which is not improved.  She had a history of constipation, which is partially relieved with eating flaxseed daily.  However, she does have to strain during bowel movement.  She is trying to follow healthy diet and incorporate some exercise, trying to lose weight.  She is taking Pepcid as needed for heartburn.  She is also found to have fatty liver as well as hiatal hernia which were incidentally found on the coronary calcium CT scan Patient is taking a lot of supplements including probiotics Patient denies smoking.  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  Her symptoms have modestly improved post treatment.  She reported worsening of acid reflux, started her on Protonix 40 mg twice daily which helps.  She has been experiencing severe constipation, has been gaining weight.  Continues to have abdominal bloating and reflux  Follow-up visit 03/23/2021 Patient continues to have ongoing abdominal bloating.  Her repeat H. pylori breath test came back positive and I prescribed Pylera.  She could not afford Pylera and lost  her insurance.  Currently she has new insurance and is requesting for H. pylori treatment with Pylera.  She does have ongoing constipation with intermittent loose stools.  NSAIDs: None  Antiplts/Anticoagulants/Anti thrombotics: None  GI Procedures: Underwent colonoscopy at age 74, reportedly normal  Upper endoscopy 08/12/20 - Normal duodenal bulb and second portion of the duodenum. - Erythematous mucosa in the gastric fundus and gastric body. Biopsied. - Normal incisura and antrum. Biopsied. - Salmon-colored mucosa suspicious for short-segment Barrett's esophagus. Biopsied. - Small hiatal hernia  DIAGNOSIS:  A. STOMACH, RANDOM; COLD BIOPSY:  - CHRONIC ACTIVE GASTRITIS WITH HELICOBACTER PYLORI TYPE ORGANISMS.  - NEGATIVE FOR DYSPLASIA AND MALIGNANCY.   B. GASTROESOPHAGEAL JUNCTION; COLD BIOPSY:  - SQUAMOUS MUCOSA WITH FEATURES OF REFLUX ESOPHAGITIS.  - NO INCREASE IN INTRAEPITHELIAL EOSINOPHILS (LESS THAN 2 PER HPF).  - NEGATIVE FOR DYSPLASIA AND MALIGNANCY.    Past Medical History:  Diagnosis Date   Anxiety    since surgery, pt has had attacks w/ fear of not being able to move her leg (anxiety attacks occur  3-4x month.)   Diabetes mellitus without complication (HCC)    pre-diabetes   H/O prior ablation treatment    Heart murmur    palpatiations   Hyperlipidemia    Hypertension    SVT (supraventricular tachycardia) (HCC)     Past Surgical History:  Procedure Laterality Date   ABDOMINAL HYSTERECTOMY     BREAST BIOPSY Right    neg   cardial ablation     anxiety attack (heart palpitations HR=210  bpm) the day she was released from the hospital from surgery. Findings were neg .   COLONOSCOPY     ESOPHAGOGASTRODUODENOSCOPY (EGD) WITH PROPOFOL N/A 08/12/2020   Procedure: ESOPHAGOGASTRODUODENOSCOPY (EGD) WITH PROPOFOL;  Surgeon: Toney Reil, MD;  Location: [facility] ENDOSCOPY;  Service: Gastroenterology;  Laterality: N/A;    Current Outpatient Medications:    Ascorbic  Acid (VITAMIN C) 100 MG tablet, Take 100 mg by mouth daily., Disp: , Rfl:    aspirin 81 MG chewable tablet, Chew by mouth., Disp: , Rfl:    chlorthalidone (HYGROTON) 25 MG tablet, Take 25 mg by mouth every other day. , Disp: , Rfl:    cholecalciferol (VITAMIN D) 25 MCG (1000 UT) tablet, Take 1,000 Units by mouth daily., Disp: , Rfl:    Cyanocobalamin (VITAMIN B 12 PO), Take 1 mg by mouth every other day. , Disp: , Rfl:    cyclobenzaprine (FLEXERIL) 5 MG tablet, PLEASE SEE ATTACHED FOR DETAILED DIRECTIONS, Disp: , Rfl:    famotidine (PEPCID) 20 MG tablet, Take 20 mg by mouth 2 (two) times daily., Disp: , Rfl:    fluticasone (FLONASE) 50 MCG/ACT nasal spray, Place 2 sprays into both nostrils daily., Disp: , Rfl:    magnesium oxide (MAG-OX) 400 MG tablet, Take 1 tablet by mouth 2 (two) times daily., Disp: , Rfl:    naproxen (NAPROSYN) 500 MG tablet, Take 500 mg by mouth 2 (two) times daily as needed., Disp: , Rfl:    nebivolol (BYSTOLIC) 5 MG tablet, Take 5 mg by mouth 2 (two) times daily. , Disp: , Rfl:    NON FORMULARY, Liver Detox 1 capsule qd., Disp: , Rfl:    NON FORMULARY, Blood sugar support Takes 1 capsule qd., Disp: , Rfl:    omeprazole (PRILOSEC) 20 MG capsule, Take 1 capsule (20 mg total) by mouth 2 (two) times daily before a meal for 14 days., Disp: 28 capsule, Rfl: 0   Potassium Chloride ER 20 MEQ TBCR, TAKE 1 TABLET BY MOUTH AS DIRECTED. TAKE 1 TABLET ONCE DAILY AND 2 TABLETS 2 DAYS A WEEK., Disp: 35 tablet, Rfl: 6   Probiotic Product (PROBIOTIC DAILY PO), Take by mouth., Disp: , Rfl:    propranolol (INDERAL) 20 MG tablet, Take 1 tablet (20 mg total) by mouth 3 (three) times daily as needed. For breakthrough palpitations., Disp: 90 tablet, Rfl: 3   valACYclovir (VALTREX) 500 MG tablet, Take 500 mg by mouth 2 (two) times daily as needed (for flares). , Disp: , Rfl:    bismuth-metronidazole-tetracycline (PYLERA) 140-125-125 MG capsule, Take 3 capsules by mouth 4 (four) times daily -   before meals and at bedtime for 14 days., Disp: 168 capsule, Rfl: 0   Multiple Vitamins-Minerals (ZINC PO), Take by mouth every other day. (Patient not taking: Reported on 03/22/2021), Disp: , Rfl:    rosuvastatin (CRESTOR) 5 MG tablet, Take 1 tablet (5 mg total) by mouth at bedtime. (Patient not taking: No sig reported), Disp: 90 tablet, Rfl: 3   Family History  Problem Relation Age of Onset   Heart disease Mother    Diabetes Mother    Hypertension Mother    Heart attack Father    Diabetes Father    Hypertension Father    Breast cancer Neg Hx      Social History   Tobacco Use   Smoking status: Former    Years: 20.00    Pack years: 0.00    Types: Cigarettes    Quit date: 03/02/2009  Years since quitting: 12.0   Smokeless tobacco: Never  Vaping Use   Vaping Use: Never used  Substance Use Topics   Alcohol use: Yes    Comment: occ   Drug use: No    Allergies as of 03/22/2021 - Review Complete 01/31/2021  Allergen Reaction Noted   Iodinated diagnostic agents Hives 06/04/2020    Review of Systems:    All systems reviewed and negative except where noted in HPI.   Physical Exam:  BP 121/80 (BP Location: Left Arm, Patient Position: Sitting, Cuff Size: Normal)   Pulse 86   Temp 98.1 F (36.7 C) (Oral)   Ht 5' (1.524 m)   Wt 175 lb 8 oz (79.6 kg)   BMI 34.27 kg/m  No LMP recorded. Patient has had a hysterectomy.  General:   Alert,  Well-developed, well-nourished, pleasant and cooperative in NAD Head:  Normocephalic and atraumatic. Eyes:  Sclera clear, no icterus.   Conjunctiva pink. Ears:  Normal auditory acuity. Nose:  No deformity, discharge, or lesions. Mouth:  No deformity or lesions,oropharynx pink & moist. Neck:  Supple; no masses or thyromegaly. Lungs:  Respirations even and unlabored.  Clear throughout to auscultation.   No wheezes, crackles, or rhonchi. No acute distress. Heart:  Regular rate and rhythm; no murmurs, clicks, rubs, or gallops. Abdomen:   Normal bowel sounds. Soft, non-tender and moderately distended, tympanic to percussion without masses, hepatosplenomegaly or hernias noted.  No guarding or rebound tenderness.   Rectal: Not performed Msk:  Symmetrical without gross deformities. Good, equal movement & strength bilaterally. Pulses:  Normal pulses noted. Extremities:  No clubbing or edema.  No cyanosis. Neurologic:  Alert and oriented x3;  grossly normal neurologically. Skin:  Intact without significant lesions or rashes. No jaundice. Psych:  Alert and cooperative. Normal mood and affect.  Imaging Studies: Reviewed  Assessment and Plan:   NIOMIE ENGLERT is a 55 y.o. Hispanic female from [place] with obesity, metabolic syndrome is seen in consultation for chronic abdominal bloating and constipation, history of H. pylori infection s/p treatment, hiatal hernia and fatty liver  H. pylori infection leading to dyspepsia, biopsy-proven based on upper endoscopy in 08/2020 S/p treatment with triple therapy  Repeat H. pylori breath test came back positive, recommend bismuth based quadruple therapy Pylera  Chronic constipation and abdominal bloating Reiterated on high-fiber diet, information provided Trial of Linzess 145 MCG daily Reiterated on healthy diet and exercise  Fatty liver: LFTs are normal Discussed in length regarding healthy diet and exercise, weight loss to prevent progression of fatty liver, control of diabetes Monitor LFTs annually   Follow up in 3 months   Arlyss Repress, MD

## 2021-03-31 ENCOUNTER — Telehealth: Payer: Self-pay | Admitting: Gastroenterology

## 2021-03-31 NOTE — Telephone Encounter (Signed)
Patient having some side effects from [place].  Please call to advise

## 2021-03-31 NOTE — Telephone Encounter (Signed)
She might be experiencing antibiotic associated diarrhea Please ask her to take a probiotic that contains lactobacillus, Bifidobacterium, Saccharomyces 1-2 times daily  She should call our office back if she develops any fever associated with nausea and vomiting or blood in the stools along with ongoing diarrhea  Thanks Michaele Amundson

## 2021-03-31 NOTE — Telephone Encounter (Addendum)
Per Dr. Allegra Lai she can take potassium.   Tired to call patient but mailbox is full

## 2021-03-31 NOTE — Telephone Encounter (Signed)
Patient states she is having abdominal pain that began last night. She states the pain is a cramping pain, headache, she was constipated the first day, but now having loose stools. She states that she had 2 bowel movements yesterday and today has already had 3. She is drinking a lot of water and is having frequent urination. She usually takes a potassium pill but wants to know if she can take the potassium with the Pylera. She is on day 3 of the antibiotic

## 2021-03-31 NOTE — Telephone Encounter (Signed)
Patient verbalized understanding of instructions  

## 2021-05-25 ENCOUNTER — Encounter: Payer: Self-pay | Admitting: Cardiovascular Disease

## 2021-05-25 ENCOUNTER — Ambulatory Visit: Payer: BC Managed Care – PPO | Admitting: Cardiovascular Disease

## 2021-05-25 ENCOUNTER — Other Ambulatory Visit: Payer: Self-pay

## 2021-05-25 VITALS — BP 100/64 | HR 65 | Ht 60.0 in | Wt 169.5 lb

## 2021-05-25 DIAGNOSIS — R079 Chest pain, unspecified: Secondary | ICD-10-CM

## 2021-05-25 DIAGNOSIS — Z79899 Other long term (current) drug therapy: Secondary | ICD-10-CM

## 2021-05-25 DIAGNOSIS — E782 Mixed hyperlipidemia: Secondary | ICD-10-CM

## 2021-05-25 DIAGNOSIS — I10 Essential (primary) hypertension: Secondary | ICD-10-CM

## 2021-05-25 DIAGNOSIS — G4733 Obstructive sleep apnea (adult) (pediatric): Secondary | ICD-10-CM

## 2021-05-25 DIAGNOSIS — I479 Paroxysmal tachycardia, unspecified: Secondary | ICD-10-CM | POA: Diagnosis not present

## 2021-05-25 DIAGNOSIS — F418 Other specified anxiety disorders: Secondary | ICD-10-CM

## 2021-05-25 MED ORDER — NEBIVOLOL HCL 5 MG PO TABS
5.0000 mg | ORAL_TABLET | Freq: Two times a day (BID) | ORAL | 3 refills | Status: DC
Start: 2021-05-25 — End: 2022-02-07

## 2021-05-25 MED ORDER — PROPRANOLOL HCL 20 MG PO TABS: 20.0000 mg | ORAL_TABLET | Freq: Three times a day (TID) | ORAL | 3 refills | Status: DC | PRN

## 2021-05-25 MED ORDER — ROSUVASTATIN CALCIUM 5 MG PO TABS: 5.0000 mg | ORAL_TABLET | Freq: Every day | ORAL | 3 refills | Status: DC

## 2021-05-25 NOTE — Patient Instructions (Addendum)
Medication Instructions:  No changes  Propranolol as needed for palpitations  If you need a refill on your cardiac medications before your next appointment, please call your pharmacy.   Lab work: CMP & Lipids Results will appear in MyChart, we will only call for ABNORMAL results  Walk into medical mall at the check in desk, they will direct you to lab registration, hours for labs are Monday-Friday 07:00am-5:30pm (no appointment necessary)   Testing/Procedures: No new testing needed  Follow-Up: At [facility], you and your health needs are our priority.  As part of our continuing mission to provide you with exceptional heart care, we have created designated Provider Care Teams.  These Care Teams include your primary Cardiologist (physician) and Advanced Practice Providers (APPs -  Physician Assistants and Nurse Practitioners) who all work together to provide you with the care you need, when you need it.  You will need a follow up appointment as needed  Providers on your designated Care Team:   Nicolasa Ducking, NP Eula Listen, PA-C Marisue Ivan, PA-C Cadence Floral, [place]  COVID-19 Vaccine Information can be found at: [email or web address] For questions related to vaccine distribution or appointments, please email [email or web address] or call [phone].

## 2021-05-25 NOTE — Progress Notes (Signed)
Date:  05/25/2021   ID:  Felicia Barnett, DOB: 1966-04-07, MRN: [id number]  PCP:  Emogene Morgan, MD   Chief Complaint  Patient presents with   Palpitations    Patient c/o palpitations, pounding in chest, chest discomfort, left shoulder pain that radiates down her left arm to fingertips. Medications reviewed by the patient verbally.     HPI:  Felicia Barnett is a 55 y.o. female with a history of: Hypertension Diabetes Palpitations Hyperlipidemia  Anxiety about health Smoker, quit 55 yo OSA on CPAP Covid Jan 2022 Who presents to the office today for palpitations and chest pressure.  Last seen in the clinic September 2021 Has two jobs  Cardiac CTA August 2021, reviewed in detail No coronary calcification, no coronary disease  Seen in the emergency room January 31, 2021 for various issues, diarrhea, palpitations  Having atypical left arm pain, Vacuuming Poor sleep/insomnia PMD started with benadryl  zio monitor  for significant arrhythmia, reviewed Rare short runs SVT/atrial tachycardia longest was 7 beatsSelect patient triggered events (2) associated with short runs of atrial tachycardia, most triggered events associated with normal sinus rhythm.  Uses CPAP, misses days here and there  Reports having a pulasation in ear Has not discussed with primary care  EKG personally reviewed by myself on todays visit Shows normal sinus rhythm rate 65 bpm no significant ST or T wave changes  Other past medical history reviewed CT abdomen 2019 reviewed with minimal aortic atherosclerosis Prior CT chest 2017 with minimal coronary calcification  Long history of palpitations, taking Bystolic 5 twice daily. Prior Holter with PVCs   PMH:   has a past medical history of Anxiety, Diabetes mellitus without complication (HCC), H/O prior ablation treatment, Heart murmur, Hyperlipidemia, Hypertension, and SVT (supraventricular tachycardia) (HCC).  PSH:    Past Surgical History:  Procedure  Laterality Date   ABDOMINAL HYSTERECTOMY     BREAST BIOPSY Right    neg   cardial ablation     anxiety attack (heart palpitations HR=210 bpm) the day she was released from the hospital from surgery. Findings were neg .   COLONOSCOPY     ESOPHAGOGASTRODUODENOSCOPY (EGD) WITH PROPOFOL N/A 08/12/2020   Procedure: ESOPHAGOGASTRODUODENOSCOPY (EGD) WITH PROPOFOL;  Surgeon: Toney Reil, MD;  Location[facility] ENDOSCOPY;  Service: Gastroenterology;  Laterality: N/A;    Current Outpatient Medications  Medication Sig Dispense Refill   Ascorbic Acid (VITAMIN C) 100 MG tablet Take 100 mg by mouth daily.     aspirin 81 MG chewable tablet Chew by mouth.     bismuth-metronidazole-tetracycline (PYLERA) 140-125-125 MG capsule Take 3 capsules by mouth 4 (four) times daily -  before meals and at bedtime for 14 days. 168 capsule 0   chlorthalidone (HYGROTON) 25 MG tablet Take 25 mg by mouth every other day.      cholecalciferol (VITAMIN D) 25 MCG (1000 UT) tablet Take 1,000 Units by mouth daily.     Cyanocobalamin (VITAMIN B 12 PO) Take 1 mg by mouth every other day.      cyclobenzaprine (FLEXERIL) 5 MG tablet PLEASE SEE ATTACHED FOR DETAILED DIRECTIONS     famotidine (PEPCID) 20 MG tablet Take 20 mg by mouth 2 (two) times daily.     fluticasone (FLONASE) 50 MCG/ACT nasal spray Place 2 sprays into both nostrils daily.     magnesium oxide (MAG-OX) 400 MG tablet Take 1 tablet by mouth 2 (two) times daily.     Multiple Vitamins-Minerals (ZINC PO) Take by mouth every other  day. (Patient not taking: Reported on 03/22/2021)     naproxen (NAPROSYN) 500 MG tablet Take 500 mg by mouth 2 (two) times daily as needed.     nebivolol (BYSTOLIC) 5 MG tablet Take 5 mg by mouth 2 (two) times daily.      NON FORMULARY Liver Detox 1 capsule qd.     NON FORMULARY Blood sugar support Takes 1 capsule qd.     omeprazole (PRILOSEC) 20 MG capsule Take 1 capsule (20 mg total) by mouth 2 (two) times daily before a meal for 14 days.  28 capsule 0   Potassium Chloride ER 20 MEQ TBCR TAKE 1 TABLET BY MOUTH AS DIRECTED. TAKE 1 TABLET ONCE DAILY AND 2 TABLETS 2 DAYS A WEEK. 35 tablet 6   Probiotic Product (PROBIOTIC DAILY PO) Take by mouth.     propranolol (INDERAL) 20 MG tablet Take 1 tablet (20 mg total) by mouth 3 (three) times daily as needed. For breakthrough palpitations. 90 tablet 3   rosuvastatin (CRESTOR) 5 MG tablet Take 1 tablet (5 mg total) by mouth at bedtime. (Patient not taking: No sig reported) 90 tablet 3   valACYclovir (VALTREX) 500 MG tablet Take 500 mg by mouth 2 (two) times daily as needed (for flares).      No current facility-administered medications for this visit.    ALLERGIES:   Iodinated diagnostic agents   SOCIAL HISTORY:  The patient  reports that she quit smoking about 12 years ago. Her smoking use included cigarettes. She has never used smokeless tobacco. She reports current alcohol use. She reports that she does not use drugs.   FAMILY HISTORY:   family history includes Diabetes in her father and mother; Heart attack in her father; Heart disease in her mother; Hypertension in her father and mother.    REVIEW OF SYSTEMS: Review of Systems  Constitutional: Negative.   HENT: Negative.    Eyes: Negative.   Respiratory: Negative.    Cardiovascular: Negative.  Negative for leg swelling.  Gastrointestinal: Negative.   Genitourinary: Negative.   Musculoskeletal: Negative.   Neurological: Negative.   Psychiatric/Behavioral:  The patient is nervous/anxious.   All other systems reviewed and are negative.  PHYSICAL EXAM: VS:  BP 100/64 (BP Location: Left Arm, Patient Position: Sitting, Cuff Size: Normal)   Ht 5' (1.524 m)   Wt 169 lb 8 oz (76.9 kg)   SpO2 98%   BMI 33.10 kg/m  , BMI Body mass index is 33.1 kg/m. Constitutional:  oriented to person, place, and time. No distress.  HENT:  Head: Grossly normal Eyes:  no discharge. No scleral icterus.  Neck: No JVD, no carotid bruits   Cardiovascular: Regular rate and rhythm, no murmurs appreciated Pulmonary/Chest: Clear to auscultation bilaterally, no wheezes or rails Abdominal: Soft.  no distension.  no tenderness.  Musculoskeletal: Normal range of motion Neurological:  normal muscle tone. Coordination normal. No atrophy Skin: Skin warm and dry Psychiatric: normal affect, pleasant  RECENT LABS: 01/31/2021: ALT 24; BUN 15; Creatinine, Ser 0.70; Hemoglobin 12.7; Platelets 219; Potassium 3.8; Sodium 139    LIPID PANEL: Lab Results  Component Value Date   CHOL 236 (H) 10/23/2019   HDL 41 10/23/2019   LDLCALC 144 (H) 10/23/2019   TRIG 282 (H) 10/23/2019      WEIGHT: Wt Readings from Last 3 Encounters:  05/25/21 169 lb 8 oz (76.9 kg)  03/22/21 175 lb 8 oz (79.6 kg)  10/13/20 180 lb 8 oz (81.9 kg)  ASSESSMENT AND PLAN:  Paroxysmal tachycardia (HCC) Symptoms well controlled on today's visit, no medication changes made On bystolic BID, prorpanolol PRN  Palpitations - Rare episodes of tachycardia, on monitor Often normal sinus rhythm associated with her triggered events Would continue Bystolic 5 twice daily with propranolol as needed for breakthrough palpitations  Insomnia Discussed strategies for sleep hygiene  Essential hypertension Blood pressure is well controlled on today's visit. No changes made to the medications.  Atypical chest pain Cardiac CTA previously ordered, no coronary calcification, normal coronary arteries, no further work-up needed  Anxiety concerning health Reassurance provided  Total encounter time more than 25 minutes. Greater than 50% was spent in counseling and coordination of care with the patient.    Signed, Dossie Arbour, M.D., Ph.D. 05/25/2021  [facility] [place], [place] [phone]

## 2021-06-23 ENCOUNTER — Encounter: Payer: Self-pay | Admitting: Gastroenterology

## 2021-06-23 ENCOUNTER — Ambulatory Visit: Payer: BC Managed Care – PPO | Admitting: Gastroenterology

## 2021-06-23 ENCOUNTER — Other Ambulatory Visit: Payer: Self-pay

## 2021-06-23 VITALS — BP 145/81 | HR 76 | Temp 97.8°F | Ht 65.0 in | Wt 177.8 lb

## 2021-06-23 DIAGNOSIS — Z8619 Personal history of other infectious and parasitic diseases: Secondary | ICD-10-CM

## 2021-06-23 DIAGNOSIS — K5904 Chronic idiopathic constipation: Secondary | ICD-10-CM | POA: Diagnosis not present

## 2021-06-23 NOTE — Progress Notes (Signed)
Arlyss Repress, MD [address]  [address]  [place], [place] [address]  Main: [phone]  Fax: [phone]    Gastroenterology Consultation  Referring Provider:     Emogene Morgan, MD Primary Care Physician:  Emogene Morgan, MD Primary Gastroenterologist:  Dr. Arlyss Repress Reason for Consultation:   Recurrent H. pylori infection        HPI:   Felicia Barnett is a 55 y.o. female referred by Dr. Emogene Morgan, MD  for consultation & management of chronic abdominal bloating.  Patient was diagnosed with H. pylori infection based on positive H. pylori stool antigen in 03/2020, s/p treatment for 2 weeks.  She has been experiencing severe abdominal bloating for several months which is not improved.  She had a history of constipation, which is partially relieved with eating flaxseed daily.  However, she does have to strain during bowel movement.  She is trying to follow healthy diet and incorporate some exercise, trying to lose weight.  She is taking Pepcid as needed for heartburn.  She is also found to have fatty liver as well as hiatal hernia which were incidentally found on the coronary calcium CT scan Patient is taking a lot of supplements including probiotics Patient denies smoking.  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  Her symptoms have modestly improved post treatment.  She reported worsening of acid reflux, started her on Protonix 40 mg twice daily which helps.  She has been experiencing severe constipation, has been gaining weight.  Continues to have abdominal bloating and reflux  Follow-up visit 03/23/2021 Patient continues to have ongoing abdominal bloating.  Her repeat H. pylori breath test came back positive and I prescribed Pylera.  She could not afford Pylera and lost  her insurance.  Currently she has new insurance and is requesting for H. pylori treatment with Pylera.  She does have ongoing constipation with intermittent loose stools.  Follow-up visit 06/23/2021 Patient has recurrent H. pylori infection, status posttreatment with Pylera.  She reports ongoing abdominal bloating.  Her constipation is under control on Linzess 142 MCG daily.  She is trying to follow a high-fiber diet but appears that she has been consuming fried foods, red meat regularly.  She is gaining weight.  She has immediate postprandial bloating.  She does not have any bloating if she does not eat.  NSAIDs: None  Antiplts/Anticoagulants/Anti thrombotics: None  GI Procedures: Underwent colonoscopy at age 64, reportedly normal  Upper endoscopy 08/12/20 - Normal duodenal bulb and second portion of the duodenum. - Erythematous mucosa in the gastric fundus and gastric body. Biopsied. - Normal incisura and antrum. Biopsied. - Salmon-colored mucosa suspicious for short-segment Barrett's esophagus. Biopsied. - Small hiatal hernia  DIAGNOSIS:  A. STOMACH, RANDOM; COLD BIOPSY:  - CHRONIC ACTIVE GASTRITIS WITH HELICOBACTER PYLORI TYPE ORGANISMS.  - NEGATIVE FOR DYSPLASIA AND MALIGNANCY.   B. GASTROESOPHAGEAL JUNCTION; COLD BIOPSY:  - SQUAMOUS MUCOSA WITH FEATURES OF REFLUX ESOPHAGITIS.  - NO INCREASE IN INTRAEPITHELIAL EOSINOPHILS (LESS THAN 2 PER HPF).  - NEGATIVE FOR DYSPLASIA AND MALIGNANCY.    Past Medical History:  Diagnosis Date   Anxiety    since surgery, pt has had attacks w/ fear of not being able to move her leg (anxiety attacks occur  3-4x month.)   Diabetes mellitus without  complication (HCC)    pre-diabetes   H/O prior ablation treatment    Heart murmur    palpatiations   Hyperlipidemia    Hypertension    SVT (supraventricular tachycardia) (HCC)     Past Surgical History:  Procedure Laterality Date   ABDOMINAL HYSTERECTOMY     BREAST BIOPSY Right    neg   cardial  ablation     anxiety attack (heart palpitations HR=210 bpm) the day she was released from the hospital from surgery. Findings were neg .   COLONOSCOPY     ESOPHAGOGASTRODUODENOSCOPY (EGD) WITH PROPOFOL N/A 08/12/2020   Procedure: ESOPHAGOGASTRODUODENOSCOPY (EGD) WITH PROPOFOL;  Surgeon: Toney Reil, MD;  Location: [facility] ENDOSCOPY;  Service: Gastroenterology;  Laterality: N/A;    Current Outpatient Medications:    Ascorbic Acid (VITAMIN C) 100 MG tablet, Take 100 mg by mouth daily., Disp: , Rfl:    aspirin 81 MG chewable tablet, Chew by mouth., Disp: , Rfl:    chlorthalidone (HYGROTON) 25 MG tablet, Take 25 mg by mouth every other day. , Disp: , Rfl:    cholecalciferol (VITAMIN D) 25 MCG (1000 UT) tablet, Take 1,000 Units by mouth daily., Disp: , Rfl:    Cyanocobalamin (VITAMIN B 12 PO), Take 1 mg by mouth every other day. , Disp: , Rfl:    cyclobenzaprine (FLEXERIL) 5 MG tablet, PLEASE SEE ATTACHED FOR DETAILED DIRECTIONS, Disp: , Rfl:    famotidine (PEPCID) 20 MG tablet, Take 20 mg by mouth 2 (two) times daily., Disp: , Rfl:    fluticasone (FLONASE) 50 MCG/ACT nasal spray, Place 2 sprays into both nostrils daily., Disp: , Rfl:    magnesium oxide (MAG-OX) 400 MG tablet, Take 1 tablet by mouth 2 (two) times daily., Disp: , Rfl:    naproxen (NAPROSYN) 500 MG tablet, Take 500 mg by mouth 2 (two) times daily as needed., Disp: , Rfl:    nebivolol (BYSTOLIC) 5 MG tablet, Take 1 tablet (5 mg total) by mouth 2 (two) times daily., Disp: 180 tablet, Rfl: 3   NON FORMULARY, Liver Detox 1 capsule qd., Disp: , Rfl:    NON FORMULARY, Blood sugar support Takes 1 capsule qd., Disp: , Rfl:    Potassium Chloride ER 20 MEQ TBCR, TAKE 1 TABLET BY MOUTH AS DIRECTED. TAKE 1 TABLET ONCE DAILY AND 2 TABLETS 2 DAYS A WEEK., Disp: 35 tablet, Rfl: 6   Probiotic Product (PROBIOTIC DAILY PO), Take by mouth., Disp: , Rfl:    propranolol (INDERAL) 20 MG tablet, Take 1 tablet (20 mg total) by mouth 3 (three) times  daily as needed. For breakthrough palpitations., Disp: 90 tablet, Rfl: 3   rosuvastatin (CRESTOR) 5 MG tablet, Take 1 tablet (5 mg total) by mouth at bedtime., Disp: 90 tablet, Rfl: 3   valACYclovir (VALTREX) 500 MG tablet, Take 500 mg by mouth 2 (two) times daily as needed (for flares). , Disp: , Rfl:    bismuth-metronidazole-tetracycline (PYLERA) 140-125-125 MG capsule, Take 3 capsules by mouth 4 (four) times daily -  before meals and at bedtime for 14 days., Disp: 168 capsule, Rfl: 0   omeprazole (PRILOSEC) 20 MG capsule, Take 1 capsule (20 mg total) by mouth 2 (two) times daily before a meal for 14 days., Disp: 28 capsule, Rfl: 0   Family History  Problem Relation Age of Onset   Heart disease Mother    Diabetes Mother    Hypertension Mother    Heart attack Father    Diabetes Father  Hypertension Father    Breast cancer Neg Hx      Social History   Tobacco Use   Smoking status: Former    Years: 20.00    Types: Cigarettes    Quit date: 03/02/2009    Years since quitting: 12.3   Smokeless tobacco: Never  Vaping Use   Vaping Use: Never used  Substance Use Topics   Alcohol use: Yes    Comment: occ   Drug use: No    Allergies as of 06/23/2021 - Review Complete 06/23/2021  Allergen Reaction Noted   Iodinated diagnostic agents Hives 06/04/2020    Review of Systems:    All systems reviewed and negative except where noted in HPI.   Physical Exam:  BP (!) 145/81 (BP Location: Right Arm, Patient Position: Sitting, Cuff Size: Normal)   Pulse 76   Temp 97.8 F (36.6 C) (Oral)   Ht 5\' 5"  (1.651 m)   Wt 177 lb 12.8 oz (80.6 kg)   BMI 29.59 kg/m  No LMP recorded. Patient has had a hysterectomy.  General:   Alert,  Well-developed, well-nourished, pleasant and cooperative in NAD Head:  Normocephalic and atraumatic. Eyes:  Sclera clear, no icterus.   Conjunctiva pink. Ears:  Normal auditory acuity. Nose:  No deformity, discharge, or lesions. Mouth:  No deformity or  lesions,oropharynx pink & moist. Neck:  Supple; no masses or thyromegaly. Lungs:  Respirations even and unlabored.  Clear throughout to auscultation.   No wheezes, crackles, or rhonchi. No acute distress. Heart:  Regular rate and rhythm; no murmurs, clicks, rubs, or gallops. Abdomen:  Normal bowel sounds. Soft, non-tender and moderately distended, tympanic to percussion without masses, hepatosplenomegaly or hernias noted.  No guarding or rebound tenderness.   Rectal: Not performed Msk:  Symmetrical without gross deformities. Good, equal movement & strength bilaterally. Pulses:  Normal pulses noted. Extremities:  No clubbing or edema.  No cyanosis. Neurologic:  Alert and oriented x3;  grossly normal neurologically. Skin:  Intact without significant lesions or rashes. No jaundice. Psych:  Alert and cooperative. Normal mood and affect.  Imaging Studies: Reviewed  Assessment and Plan:   LAQUITHA HESLIN is a 54 y.o. Hispanic female from 56 with obesity, metabolic syndrome is seen in consultation for chronic abdominal bloating and constipation, history of H. pylori infection s/p treatment with triple therapy as well as Pylera, hiatal hernia and fatty liver  Dyspepsia H. pylori infection leading to dyspepsia, biopsy-proven based on upper endoscopy in 08/2020 S/p treatment with triple therapy  Repeat H. pylori breath test came back positive, s/p treatment with bismuth based quadruple therapy Repeat H. pylori breath test today Check celiac panel  Chronic constipation and abdominal bloating Reiterated on high-fiber diet Continue Linzess 145 MCG daily Reiterated on healthy diet and exercise Trial of IBgard, samples provided  Fatty liver: LFTs are normal Discussed in length regarding healthy diet and exercise, weight loss to prevent progression of fatty liver, control of diabetes Monitor LFTs annually   Follow up in 4 months   09/2020, MD

## 2021-06-23 NOTE — Patient Instructions (Signed)
Givin  ibegaurd samples call us if they help so we can call in a prescription

## 2021-06-24 LAB — H. PYLORI BREATH TEST: H pylori Breath Test: NEGATIVE

## 2021-06-25 LAB — CELIAC DISEASE PANEL
Endomysial IgA: NEGATIVE
IgA/Immunoglobulin A, Serum: 245 mg/dL (ref 87–352)
Transglutaminase IgA: <2 U/mL (ref 0–3)

## 2021-06-28 ENCOUNTER — Telehealth: Payer: Self-pay

## 2021-06-28 NOTE — Telephone Encounter (Signed)
Sent mychart message

## 2021-06-28 NOTE — Telephone Encounter (Signed)
-----   Message from Toney Reil, MD sent at 06/28/2021  1:48 PM EDT ----- Please inform patient that the H. pylori breath test came back negative.  She no longer has H. pylori infection  RV

## 2021-08-09 ENCOUNTER — Other Ambulatory Visit: Payer: Self-pay | Admitting: Family Medicine

## 2021-08-09 DIAGNOSIS — Z1231 Encounter for screening mammogram for malignant neoplasm of breast: Secondary | ICD-10-CM

## 2021-08-13 ENCOUNTER — Emergency Department: Payer: BC Managed Care – PPO

## 2021-08-13 ENCOUNTER — Other Ambulatory Visit: Payer: Self-pay

## 2021-08-13 DIAGNOSIS — Z79899 Other long term (current) drug therapy: Secondary | ICD-10-CM | POA: Diagnosis not present

## 2021-08-13 DIAGNOSIS — E119 Type 2 diabetes mellitus without complications: Secondary | ICD-10-CM | POA: Diagnosis not present

## 2021-08-13 DIAGNOSIS — R0789 Other chest pain: Secondary | ICD-10-CM | POA: Diagnosis not present

## 2021-08-13 DIAGNOSIS — Z7982 Long term (current) use of aspirin: Secondary | ICD-10-CM | POA: Diagnosis not present

## 2021-08-13 DIAGNOSIS — I10 Essential (primary) hypertension: Secondary | ICD-10-CM | POA: Diagnosis not present

## 2021-08-13 DIAGNOSIS — Z7951 Long term (current) use of inhaled steroids: Secondary | ICD-10-CM | POA: Insufficient documentation

## 2021-08-13 DIAGNOSIS — Z87891 Personal history of nicotine dependence: Secondary | ICD-10-CM | POA: Insufficient documentation

## 2021-08-13 DIAGNOSIS — M79604 Pain in right leg: Secondary | ICD-10-CM | POA: Diagnosis not present

## 2021-08-13 LAB — CBC
HCT: 36.8 % (ref 36.0–46.0)
Hemoglobin: 12.5 g/dL (ref 12.0–15.0)
MCH: 29.8 pg (ref 26.0–34.0)
MCHC: 34 g/dL (ref 30.0–36.0)
MCV: 87.6 fL (ref 80.0–100.0)
Platelets: 225 10*3/uL (ref 150–400)
RBC: 4.2 MIL/uL (ref 3.87–5.11)
RDW: 12.2 % (ref 11.5–15.5)
WBC: 5.3 10*3/uL (ref 4.0–10.5)
nRBC: 0 % (ref 0.0–0.2)

## 2021-08-13 NOTE — ED Triage Notes (Addendum)
Pt presents ambulatory to triage with c/o substernal chest pain that radiates to L arm x 1 week- also endorses diaphoresis, jaw pain. Pt with hx of PSVT with prior ablations.

## 2021-08-14 ENCOUNTER — Emergency Department
Admission: EM | Admit: 2021-08-14 | Discharge: 2021-08-14 | Disposition: A | Discharge status: Home / Self Care | Payer: BC Managed Care – PPO | Attending: Emergency Medicine | Admitting: Emergency Medicine

## 2021-08-14 ENCOUNTER — Emergency Department: Payer: BC Managed Care – PPO

## 2021-08-14 DIAGNOSIS — R079 Chest pain, unspecified: Secondary | ICD-10-CM

## 2021-08-14 LAB — TROPONIN I (HIGH SENSITIVITY)
Troponin I (High Sensitivity): 4 ng/L (ref <18)
Troponin I (High Sensitivity): 4 ng/L (ref <18)

## 2021-08-14 LAB — BASIC METABOLIC PANEL
Anion gap: 8 (ref 5–15)
BUN: 14 mg/dL (ref 6–20)
CO2: 26 mmol/L (ref 22–32)
Calcium: 9.1 mg/dL (ref 8.9–10.3)
Chloride: 103 mmol/L (ref 98–111)
Creatinine, Ser: 0.79 mg/dL (ref 0.44–1.00)
GFR, Estimated: >60 mL/min (ref >60)
Glucose, Bld: 142 mg/dL — ABNORMAL HIGH (ref 70–99)
Potassium: 3.7 mmol/L (ref 3.5–5.1)
Sodium: 137 mmol/L (ref 135–145)

## 2021-08-14 LAB — POC URINE PREG, ED: Preg Test, Ur: NEGATIVE

## 2021-08-14 LAB — MAGNESIUM: Magnesium: 1.9 mg/dL (ref 1.7–2.4)

## 2021-08-14 NOTE — ED Notes (Signed)
Pt stated to this tech, "I feel like I have a blood clot in my leg. I am having a lot of pain." When asked where her pain was located, pt pointed to her right shin and knee. RN aware.

## 2021-08-14 NOTE — ED Notes (Signed)
Pt wanting her magnesium level checked d/t "feeling dizzy some times and I take medicine for magnesium."

## 2021-08-14 NOTE — Discharge Instructions (Signed)
Your blood work including your troponin, your potassium and magnesium levels were all normal.  Your EKG and chest x-ray were also reassuring.  It is very unlikely that you are having a heart attack.  However it is always possible that your pain is related to your heart and you should follow-up with your cardiologist as you may need to have a stress test if your symptoms persist.  The ultrasound of your right leg did not show any blood clot.  Please elevate the leg ice and rest and you can take Tylenol for pain.

## 2021-08-14 NOTE — ED Provider Notes (Signed)
[facility]  ____________________________________________   Event Date/Time   First MD Initiated Contact with Patient 08/14/21 [phone]     (approximate)  I have reviewed the triage vital signs and the nursing notes.   HISTORY  Chief Complaint Chest Pain    HPI Felicia Barnett is a 55 y.o. female with past medical history of hypertension, hyperlipidemia, prediabetes and paroxysmal SVT status post ablation who presents with about 6 months of left-sided chest pressure as well as 2 days of right leg pain.  Patient notes that the pain in her left chest has been going on for about 6 months.  Is intermittent, not daily.  No clear exacerbating or alleviating factors other than it is worse when she twists her body and when she lays on that side.  She denies any symptoms with exertion or with deep breathing.  She denies associated shortness of breath.  She has not yet told her cardiologist about the symptoms.  She has no associated nausea vomiting or diaphoresis when the pain comes on.  She developed pain in the right leg 2 days ago which is why she presented today given they were both happening at the same time, not because it was any change in her chest pain.  She denies any injury to the right leg.  There is pain over the right shin and she noticed that there is a vein on her shin looks large. The patient denies hx of prior DVT/PE, unilateral leg pain/swelling, hormone use, recent surgery, hx of cancer, prolonged immobilization, or hemoptysis.           Past Medical History:  Diagnosis Date   Anxiety    since surgery, pt has had attacks w/ fear of not being able to move her leg (anxiety attacks occur  3-4x month.)   Diabetes mellitus without complication (HCC)    pre-diabetes   H/O prior ablation treatment    Heart murmur    palpatiations   Hyperlipidemia    Hypertension    SVT (supraventricular tachycardia) (HCC)     Patient Active Problem List   Diagnosis  Date Noted   History of Helicobacter pylori infection    Paroxysmal tachycardia (HCC) 12/24/2018   Chest pain 12/24/2018   Palpitations 12/24/2018   Essential hypertension 12/24/2018   Low potassium syndrome 12/24/2018   PVC's (premature ventricular contractions) 12/24/2018   Other fatigue 12/24/2018   Lower extremity neuropathy 08/07/2015   Paresthesia of left leg 07/14/2015   Anxiety 10/29/2013   Hyperlipidemia 10/29/2013    Past Surgical History:  Procedure Laterality Date   ABDOMINAL HYSTERECTOMY     BREAST BIOPSY Right    neg   cardial ablation     anxiety attack (heart palpitations HR=210 bpm) the day she was released from the hospital from surgery. Findings were neg .   COLONOSCOPY     ESOPHAGOGASTRODUODENOSCOPY (EGD) WITH PROPOFOL N/A 08/12/2020   Procedure: ESOPHAGOGASTRODUODENOSCOPY (EGD) WITH PROPOFOL;  Surgeon: Toney Reil, MD;  Location: [facility] ENDOSCOPY;  Service: Gastroenterology;  Laterality: N/A;    Prior to Admission medications   Medication Sig Start Date End Date Taking? Authorizing Provider  Ascorbic Acid (VITAMIN C) 100 MG tablet Take 100 mg by mouth daily.    [provider]  aspirin 81 MG chewable tablet Chew by mouth.    [provider]  bismuth-metronidazole-tetracycline [facility]) [phone] MG capsule Take 3 capsules by mouth 4 (four) times daily -  before meals and at bedtime for 14 days.  03/22/21 05/25/21  Toney Reil, MD  chlorthalidone (HYGROTON) 25 MG tablet Take 25 mg by mouth every other day.     [provider]  cholecalciferol (VITAMIN D) 25 MCG (1000 UT) tablet Take 1,000 Units by mouth daily.    [provider]  Cyanocobalamin (VITAMIN B 12 PO) Take 1 mg by mouth every other day.     [provider]  cyclobenzaprine (FLEXERIL) 5 MG tablet PLEASE SEE ATTACHED FOR DETAILED DIRECTIONS 10/12/20   [provider]  famotidine (PEPCID) 20 MG tablet Take 20 mg by mouth 2 (two) times daily.  10/09/20   [provider]  fluticasone (FLONASE) 50 MCG/ACT nasal spray Place 2 sprays into both nostrils daily. 10/12/20   [provider]  magnesium oxide (MAG-OX) 400 MG tablet Take 1 tablet by mouth 2 (two) times daily. 03/10/21   [provider]  naproxen (NAPROSYN) 500 MG tablet Take 500 mg by mouth 2 (two) times daily as needed. 10/12/20   [provider]  nebivolol (BYSTOLIC) 5 MG tablet Take 1 tablet (5 mg total) by mouth 2 (two) times daily. 05/25/21   Antonieta Iba, MD  NON FORMULARY Liver Detox 1 capsule qd.    [provider]  NON FORMULARY Blood sugar support Takes 1 capsule qd.    [provider]  omeprazole (PRILOSEC) 20 MG capsule Take 1 capsule (20 mg total) by mouth 2 (two) times daily before a meal for 14 days. 10/30/20 05/25/21  Toney Reil, MD  Potassium Chloride ER 20 MEQ TBCR TAKE 1 TABLET BY MOUTH AS DIRECTED. TAKE 1 TABLET ONCE DAILY AND 2 TABLETS 2 DAYS A WEEK. 11/04/19   Antonieta Iba, MD  Probiotic Product (PROBIOTIC DAILY PO) Take by mouth.    [provider]  propranolol (INDERAL) 20 MG tablet Take 1 tablet (20 mg total) by mouth 3 (three) times daily as needed. For breakthrough palpitations. 05/25/21   Antonieta Iba, MD  rosuvastatin (CRESTOR) 5 MG tablet Take 1 tablet (5 mg total) by mouth at bedtime. 05/25/21   Antonieta Iba, MD  valACYclovir (VALTREX) 500 MG tablet Take 500 mg by mouth 2 (two) times daily as needed (for flares).     [provider]    Allergies Iodinated diagnostic agents  Family History  Problem Relation Age of Onset   Heart disease Mother    Diabetes Mother    Hypertension Mother    Heart attack Father    Diabetes Father    Hypertension Father    Breast cancer Neg Hx     Social History Social History   Tobacco Use   Smoking status: Former    Years: 20.00    Types: Cigarettes    Quit date: 03/02/2009    Years since quitting: 12.4   Smokeless  tobacco: Never  Vaping Use   Vaping Use: Never used  Substance Use Topics   Alcohol use: Yes    Comment: occ   Drug use: No    Review of Systems   Review of Systems  Constitutional:  Negative for chills and fever.  Respiratory:  Positive for chest tightness. Negative for shortness of breath.   Cardiovascular:  Positive for chest pain. Negative for leg swelling.  Gastrointestinal:  Negative for abdominal pain, nausea and vomiting.  Musculoskeletal:  Positive for arthralgias and myalgias.  All other systems reviewed and are negative.  Physical Exam Updated Vital Signs BP 123/73 (BP Location: Left Arm)   Pulse  64   Temp 97.6 F (36.4 C) (Oral)   Resp 20   Ht 5' (1.524 m)   Wt 80.7 kg   SpO2 98%   BMI 34.76 kg/m   Physical Exam Vitals and nursing note reviewed.  Constitutional:      General: She is not in acute distress.    Appearance: Normal appearance.  HENT:     Head: Normocephalic and atraumatic.  Eyes:     General: No scleral icterus.    Conjunctiva/sclera: Conjunctivae normal.  Cardiovascular:     Rate and Rhythm: Normal rate and regular rhythm.     Heart sounds: Normal heart sounds.  Pulmonary:     Effort: Pulmonary effort is normal. No respiratory distress.     Breath sounds: No stridor. No decreased breath sounds.  Musculoskeletal:        General: No deformity or signs of injury.     Cervical back: Normal range of motion.     Right lower leg: No edema.     Left lower leg: No edema.     Comments: Is to palpation over the right proximal anterior shin, no swelling erythema or pitting edema, no calf tenderness  Skin:    General: Skin is dry.     Coloration: Skin is not jaundiced or pale.  Neurological:     General: No focal deficit present.     Mental Status: She is alert and oriented to person, place, and time. Mental status is at baseline.  Psychiatric:        Mood and Affect: Mood normal.        Behavior: Behavior normal.     LABS (all labs  ordered are listed, but only abnormal results are displayed)  Labs Reviewed  BASIC METABOLIC PANEL - Abnormal; Notable for the following components:      Result Value   Glucose, Bld 142 (*)    All other components within normal limits  POC URINE PREG, ED - Normal  CBC  MAGNESIUM  TROPONIN I (HIGH SENSITIVITY)  TROPONIN I (HIGH SENSITIVITY)   ____________________________________________  EKG Normal sinus rhythm, normal axis, normal intervals, no acute ischemic changes ____________________________________________  RADIOLOGY Ky Barban, personally viewed and evaluated these images (plain radiographs) as part of my medical decision making, as well as reviewing the written report by the radiologist.  ED MD interpretation:  I reviewed the CXR which does not show any acute cardiopulmonary process      ____________________________________________   PROCEDURES  Procedure(s) performed (including Critical Care):  Procedures   ____________________________________________   INITIAL IMPRESSION / ASSESSMENT AND PLAN / ED COURSE     Patient is a 55 year old female who presents with 6 months of left-sided chest pain as well as 2 days of right leg pain.  Chest pain is very atypical and that it is nonexertional nonpleuritic and on with twisting motions and laying on that side.  Seems to be more musculoskeletal than anginal to me.  Her troponins x2 are negative her EKG is nonischemic and her chest x-ray is normal.  She has no risk factors for DVT and no evidence of DVT on exam.  She does complain of 2 days of right leg pain which is much newer than her chest pain.  A venous Doppler study was obtained from triage to rule out DVT which is negative.  Clinically does not appear to have DVT.  I do not think that these 2 things are related.  I have very low suspicion  for PE based on her lack of risk factors no evidence of DVT and her normal vital signs.  I did advise that she follow-up with  her cardiologist as if she continues to have this intermittent chest pain she may need to have a stress test or other provocative testing however we have essentially ruled out MI and her pain is not consistent with unstable angina.  I feel that she is stable at this time for discharge.      ____________________________________________   FINAL CLINICAL IMPRESSION(S) / ED DIAGNOSES  Final diagnoses:  Chest pain, unspecified type     ED Discharge Orders     None        Note:  This document was prepared using Dragon voice recognition software and may include unintentional dictation errors.    Georga Hacking, MD 08/14/21 [phone]

## 2021-08-23 ENCOUNTER — Ambulatory Visit
Admission: RE | Admit: 2021-08-23 | Discharge: 2021-08-23 | Disposition: A | Discharge status: Home / Self Care | Payer: BC Managed Care – PPO | Source: Ambulatory Visit | Attending: Family Medicine | Admitting: Family Medicine

## 2021-08-23 ENCOUNTER — Other Ambulatory Visit: Payer: Self-pay

## 2021-08-23 DIAGNOSIS — Z1231 Encounter for screening mammogram for malignant neoplasm of breast: Secondary | ICD-10-CM | POA: Diagnosis present

## 2021-09-05 ENCOUNTER — Other Ambulatory Visit: Payer: Self-pay

## 2021-09-05 ENCOUNTER — Emergency Department
Admission: EM | Admit: 2021-09-05 | Discharge: 2021-09-05 | Disposition: A | Discharge status: Home / Self Care | Payer: BC Managed Care – PPO | Attending: Emergency Medicine | Admitting: Emergency Medicine

## 2021-09-05 DIAGNOSIS — I10 Essential (primary) hypertension: Secondary | ICD-10-CM | POA: Diagnosis not present

## 2021-09-05 DIAGNOSIS — Z7982 Long term (current) use of aspirin: Secondary | ICD-10-CM | POA: Diagnosis not present

## 2021-09-05 DIAGNOSIS — Z79899 Other long term (current) drug therapy: Secondary | ICD-10-CM | POA: Insufficient documentation

## 2021-09-05 DIAGNOSIS — R350 Frequency of micturition: Secondary | ICD-10-CM | POA: Diagnosis not present

## 2021-09-05 DIAGNOSIS — Z87891 Personal history of nicotine dependence: Secondary | ICD-10-CM | POA: Diagnosis not present

## 2021-09-05 DIAGNOSIS — R110 Nausea: Secondary | ICD-10-CM | POA: Insufficient documentation

## 2021-09-05 DIAGNOSIS — R300 Dysuria: Secondary | ICD-10-CM | POA: Diagnosis present

## 2021-09-05 DIAGNOSIS — M545 Low back pain, unspecified: Secondary | ICD-10-CM

## 2021-09-05 DIAGNOSIS — E119 Type 2 diabetes mellitus without complications: Secondary | ICD-10-CM | POA: Diagnosis not present

## 2021-09-05 LAB — CBC
HCT: 39.1 % (ref 36.0–46.0)
Hemoglobin: 13.4 g/dL (ref 12.0–15.0)
MCH: 30.2 pg (ref 26.0–34.0)
MCHC: 34.3 g/dL (ref 30.0–36.0)
MCV: 88.3 fL (ref 80.0–100.0)
Platelets: 261 10*3/uL (ref 150–400)
RBC: 4.43 MIL/uL (ref 3.87–5.11)
RDW: 12.3 % (ref 11.5–15.5)
WBC: 7.8 10*3/uL (ref 4.0–10.5)
nRBC: 0 % (ref 0.0–0.2)

## 2021-09-05 LAB — COMPREHENSIVE METABOLIC PANEL
ALT: 20 U/L (ref 0–44)
AST: 18 U/L (ref 15–41)
Albumin: 4.3 g/dL (ref 3.5–5.0)
Alkaline Phosphatase: 58 U/L (ref 38–126)
Anion gap: 9 (ref 5–15)
BUN: 22 mg/dL — ABNORMAL HIGH (ref 6–20)
CO2: 27 mmol/L (ref 22–32)
Calcium: 9.2 mg/dL (ref 8.9–10.3)
Chloride: 102 mmol/L (ref 98–111)
Creatinine, Ser: 0.66 mg/dL (ref 0.44–1.00)
GFR, Estimated: >60 mL/min (ref >60)
Glucose, Bld: 111 mg/dL — ABNORMAL HIGH (ref 70–99)
Potassium: 4 mmol/L (ref 3.5–5.1)
Sodium: 138 mmol/L (ref 135–145)
Total Bilirubin: 0.7 mg/dL (ref 0.3–1.2)
Total Protein: 7.9 g/dL (ref 6.5–8.1)

## 2021-09-05 LAB — URINALYSIS, ROUTINE W REFLEX MICROSCOPIC
Bilirubin Urine: NEGATIVE
Glucose, UA: NEGATIVE mg/dL
Hgb urine dipstick: NEGATIVE
Ketones, ur: NEGATIVE mg/dL
Leukocytes,Ua: NEGATIVE
Nitrite: NEGATIVE
Protein, ur: NEGATIVE mg/dL
Specific Gravity, Urine: 1.015 (ref 1.005–1.030)
pH: 5.5 (ref 5.0–8.0)

## 2021-09-05 MED ORDER — NAPROXEN 500 MG PO TABS: 500.0000 mg | ORAL_TABLET | Freq: Two times a day (BID) | ORAL | 0 refills | Status: DC

## 2021-09-05 MED ORDER — SULFAMETHOXAZOLE-TRIMETHOPRIM 800-160 MG PO TABS: 1.0000 | ORAL_TABLET | Freq: Two times a day (BID) | ORAL | 0 refills | Status: AC

## 2021-09-05 NOTE — ED Provider Notes (Signed)
[facility] Emergency Department Provider Note ____________________________________________   Event Date/Time   First MD Initiated Contact with Patient 09/05/21 0848     (approximate)  I have reviewed the triage vital signs and the nursing notes.   HISTORY  Chief Complaint Back Pain and Dysuria  HPI Felicia Barnett is a 55 y.o. female with history as listed below presents to the emergency department for treatment and evaluation of lower back pain, dysuria, and urinary frequency with occasional nausea.  Urinary frequency has been present for months.  Dysuria and back pain have been present for the past 10 days.  No fever.         Past Medical History:  Diagnosis Date   Anxiety    since surgery, pt has had attacks w/ fear of not being able to move her leg (anxiety attacks occur  3-4x month.)   Diabetes mellitus without complication (HCC)    pre-diabetes   H/O prior ablation treatment    Heart murmur    palpatiations   Hyperlipidemia    Hypertension    SVT (supraventricular tachycardia) (HCC)     Patient Active Problem List   Diagnosis Date Noted   History of Helicobacter pylori infection    Paroxysmal tachycardia (HCC) 12/24/2018   Chest pain 12/24/2018   Palpitations 12/24/2018   Essential hypertension 12/24/2018   Low potassium syndrome 12/24/2018   PVC's (premature ventricular contractions) 12/24/2018   Other fatigue 12/24/2018   Lower extremity neuropathy 08/07/2015   Paresthesia of left leg 07/14/2015   Anxiety 10/29/2013   Hyperlipidemia 10/29/2013    Past Surgical History:  Procedure Laterality Date   ABDOMINAL HYSTERECTOMY     BREAST BIOPSY Right    neg,excisional bx   cardial ablation     anxiety attack (heart palpitations HR=210 bpm) the day she was released from the hospital from surgery. Findings were neg .   COLONOSCOPY     ESOPHAGOGASTRODUODENOSCOPY (EGD) WITH PROPOFOL N/A 08/12/2020   Procedure:  ESOPHAGOGASTRODUODENOSCOPY (EGD) WITH PROPOFOL;  Surgeon: Toney Reil, MD;  Location: [facility] ENDOSCOPY;  Service: Gastroenterology;  Laterality: N/A;    Prior to Admission medications   Medication Sig Start Date End Date Taking? Authorizing Provider  naproxen (NAPROSYN) 500 MG tablet Take 1 tablet (500 mg total) by mouth 2 (two) times daily with a meal. 09/05/21  Yes Raisha Brabender B, FNP  sulfamethoxazole-trimethoprim (BACTRIM DS) 800-160 MG tablet Take 1 tablet by mouth 2 (two) times daily for 3 days. 09/05/21 09/08/21 Yes Kazue Cerro B, FNP  Ascorbic Acid (VITAMIN C) 100 MG tablet Take 100 mg by mouth daily.    [provider]  aspirin 81 MG chewable tablet Chew by mouth.    [provider]  bismuth-metronidazole-tetracycline [facility]) [phone] MG capsule Take 3 capsules by mouth 4 (four) times daily -  before meals and at bedtime for 14 days. 03/22/21 05/25/21  Toney Reil, MD  chlorthalidone (HYGROTON) 25 MG tablet Take 25 mg by mouth every other day.     [provider]  cholecalciferol (VITAMIN D) 25 MCG (1000 UT) tablet Take 1,000 Units by mouth daily.    [provider]  Cyanocobalamin (VITAMIN B 12 PO) Take 1 mg by mouth every other day.     [provider]  cyclobenzaprine (FLEXERIL) 5 MG tablet PLEASE SEE ATTACHED FOR DETAILED DIRECTIONS 10/12/20   [provider]  famotidine (PEPCID) 20 MG tablet Take 20 mg by mouth 2 (two) times daily. 10/09/20  [provider]  fluticasone (FLONASE) 50 MCG/ACT nasal spray Place 2 sprays into both nostrils daily. 10/12/20   [provider]  magnesium oxide (MAG-OX) 400 MG tablet Take 1 tablet by mouth 2 (two) times daily. 03/10/21   [provider]  nebivolol (BYSTOLIC) 5 MG tablet Take 1 tablet (5 mg total) by mouth 2 (two) times daily. 05/25/21   Antonieta Iba, MD  NON FORMULARY Liver Detox 1 capsule qd.    [provider]  NON FORMULARY Blood  sugar support Takes 1 capsule qd.    [provider]  omeprazole (PRILOSEC) 20 MG capsule Take 1 capsule (20 mg total) by mouth 2 (two) times daily before a meal for 14 days. 10/30/20 05/25/21  Toney Reil, MD  Potassium Chloride ER 20 MEQ TBCR TAKE 1 TABLET BY MOUTH AS DIRECTED. TAKE 1 TABLET ONCE DAILY AND 2 TABLETS 2 DAYS A WEEK. 11/04/19   Antonieta Iba, MD  Probiotic Product (PROBIOTIC DAILY PO) Take by mouth.    [provider]  propranolol (INDERAL) 20 MG tablet Take 1 tablet (20 mg total) by mouth 3 (three) times daily as needed. For breakthrough palpitations. 05/25/21   Antonieta Iba, MD  rosuvastatin (CRESTOR) 5 MG tablet Take 1 tablet (5 mg total) by mouth at bedtime. 05/25/21   Antonieta Iba, MD  valACYclovir (VALTREX) 500 MG tablet Take 500 mg by mouth 2 (two) times daily as needed (for flares).     [provider]    Allergies Iodinated diagnostic agents  Family History  Problem Relation Age of Onset   Heart disease Mother    Diabetes Mother    Hypertension Mother    Heart attack Father    Diabetes Father    Hypertension Father    Breast cancer Neg Hx     Social History Social History   Tobacco Use   Smoking status: Former    Years: 20.00    Types: Cigarettes    Quit date: 03/02/2009    Years since quitting: 12.5   Smokeless tobacco: Never  Vaping Use   Vaping Use: Never used  Substance Use Topics   Alcohol use: Yes    Comment: occ   Drug use: No    Review of Systems  Constitutional: No fever/chills Eyes: No visual changes. ENT: No sore throat. Cardiovascular: Denies chest pain. Respiratory: Denies shortness of breath. Gastrointestinal: No abdominal pain.  Positive for nausea, no vomiting.  No diarrhea.  No constipation. Genitourinary: Positive for dysuria.  Positive for urinary frequency Musculoskeletal: Positive for back pain. Skin: Negative for rash. Neurological: Negative for headaches, focal weakness or  numbness  ____________________________________________   PHYSICAL EXAM:  VITAL SIGNS: ED Triage Vitals  Enc Vitals Group     BP 09/05/21 0354 (!) 132/97     Pulse Rate 09/05/21 0354 (!) 52     Resp 09/05/21 0354 18     Temp 09/05/21 0354 97.8 F (36.6 C)     Temp Source 09/05/21 0354 Oral     SpO2 09/05/21 0354 94 %     Weight 09/05/21 0355 170 lb (77.1 kg)     Height 09/05/21 0355 5' (1.524 m)     Head Circumference --      Peak Flow --      Pain Score 09/05/21 0354 8     Pain Loc --      Pain Edu? --      Excl. in GC? --  Constitutional: Alert and oriented. Well appearing and in no acute distress. Eyes: Conjunctivae are normal.  Head: Atraumatic. Nose: No congestion/rhinnorhea. Mouth/Throat: Mucous membranes are moist.  Oropharynx non-erythematous. Neck: No stridor.   Hematological/Lymphatic/Immunilogical: No cervical lymphadenopathy. Cardiovascular: Normal rate, regular rhythm. Grossly normal heart sounds.  Good peripheral circulation. Respiratory: Normal respiratory effort.  No retractions. Lungs CTAB. Gastrointestinal: Soft and nontender. No distention. No abdominal bruits. No CVA tenderness. Genitourinary:  Musculoskeletal: No lower extremity tenderness nor edema.  No joint effusions. Neurologic:  Normal speech and language. No gross focal neurologic deficits are appreciated. No gait instability. Skin:  Skin is warm, dry and intact. No rash noted. Psychiatric: Mood and affect are normal. Speech and behavior are normal.  ____________________________________________   LABS (all labs ordered are listed, but only abnormal results are displayed)  Labs Reviewed  COMPREHENSIVE METABOLIC PANEL - Abnormal; Notable for the following components:      Result Value   Glucose, Bld 111 (*)    BUN 22 (*)    All other components within normal limits  URINALYSIS, ROUTINE W REFLEX MICROSCOPIC - Abnormal; Notable for the following components:   Color, Urine YELLOW (*)     APPearance CLEAR (*)    Bacteria, UA RARE (*)    All other components within normal limits  CBC   ____________________________________________  EKG  Not indicated. ____________________________________________  RADIOLOGY  ED MD interpretation:    Not indicated.  I, Kem Boroughs, personally viewed and evaluated these images (plain radiographs) as part of my medical decision making, as well as reviewing the written report by the radiologist.  Official radiology report(s): No results found.  ____________________________________________   PROCEDURES  Procedure(s) performed (including Critical Care):  Procedures  ____________________________________________   INITIAL IMPRESSION / ASSESSMENT AND PLAN     55 year old female presenting to the emergency department for treatment and evaluation of low back pain, dysuria, and urinary frequency.  See HPI for further details.  Urinalysis is overall reassuring and just shows rare bacteria.  CMP unremarkable.  CBC without leukocytosis.   Patient has had a hysterectomy.  She denies any concern for STD. Symptoms most likely to bladder weakening, but will treat with 3 days of antibiotics since she does have some dysuria. She is to return to the ER for symptoms that change, worsen, or for new concerns if she is unable to schedule an appointment with gynecology or primary care.     As part of my medical decision making, I reviewed the following data within the electronic MEDICAL RECORD NUMBER   ___________________________________________   FINAL CLINICAL IMPRESSION(S) / ED DIAGNOSES  Final diagnoses:  Dysuria  Urinary frequency  Acute bilateral low back pain without sciatica     ED Discharge Orders          Ordered    sulfamethoxazole-trimethoprim (BACTRIM DS) 800-160 MG tablet  2 times daily        09/05/21 0845    naproxen (NAPROSYN) 500 MG tablet  2 times daily with meals        09/05/21 0845             Felicia Barnett was evaluated in Emergency Department on 09/05/2021 for the symptoms described in the history of present illness. She was evaluated in the context of the global COVID-19 pandemic, which necessitated consideration that the patient might be at risk for infection with the SARS-CoV-2 virus that causes COVID-19. Institutional protocols and algorithms that pertain to the evaluation of patients at risk  for COVID-19 are in a state of rapid change based on information released by regulatory bodies including the CDC and federal and state organizations. These policies and algorithms were followed during the patient's care in the ED.   Note:  This document was prepared using Dragon voice recognition software and may include unintentional dictation errors.    Chinita Pester, FNP 09/05/21 1518    Merwyn Katos, MD 09/06/21 [phone]

## 2021-09-05 NOTE — Discharge Instructions (Signed)
Please call and schedule follow-up appointment with gynecology as we discussed.  Take 3 days of the antibiotic until finished.  Take the Naprosyn twice per day until back pain if needed.

## 2021-09-05 NOTE — ED Triage Notes (Signed)
Pt states a week back pain and dysuria. Pt denies fever, states has had nausea.

## 2021-09-05 NOTE — ED Notes (Signed)
ED Provider at bedside. 

## 2021-09-19 NOTE — Progress Notes (Signed)
09/20/2021 9:03 AM   Felicia Barnett 20-Apr-1966 [id number]  Referring provider: Letta Median, MD [place],  [place] [id number]  Chief Complaint  Patient presents with   Recurrent UTI    HPI: Felicia Barnett is a 55 y.o. female referred for evaluation of recurrent UTIs.  [facility] ED visit 09/05/2021 for back pain and dysuria Urinalysis was normal; treated with a 3-day course of antibiotics Record review with 1 positive urine culture for Proteus in 2020.  Urine culture since that time have been negative Primary care is [facility].  Urine culture was ordered 09/09/2021 though the results were not forwarded When symptomatic she complains of pelvic pain/pressure, frequency, urgency and dysuria States symptoms resolved with antibiotic She has mild stress incontinence She also complains of "nerve pain/numbness" from left groin region to anterior thigh which occurred after a hysterectomy in 2015 Denies gross hematuria She is presently asymptomatic  PMH: Past Medical History:  Diagnosis Date   Anxiety    since surgery, pt has had attacks w/ fear of not being able to move her leg (anxiety attacks occur  3-4x month.)   Diabetes mellitus without complication ([place])    pre-diabetes   H/O prior ablation treatment    Heart murmur    palpatiations   Hyperlipidemia    Hypertension    SVT (supraventricular tachycardia) ([place])     Surgical History: Past Surgical History:  Procedure Laterality Date   ABDOMINAL HYSTERECTOMY     BREAST BIOPSY Right    neg,excisional bx   cardial ablation     anxiety attack (heart palpitations HR=210 bpm) the day she was released from the hospital from surgery. Findings were neg .   COLONOSCOPY     ESOPHAGOGASTRODUODENOSCOPY (EGD) WITH PROPOFOL N/A 08/12/2020   Procedure: ESOPHAGOGASTRODUODENOSCOPY (EGD) WITH PROPOFOL;  Surgeon: Lin Landsman, MD;  Location: [facility];  Service: Gastroenterology;   Laterality: N/A;    Home Medications:  Allergies as of 09/20/2021       Reactions   Iodinated Diagnostic Agents Hives   Pt was injected with 65mL of Omnipaque 350 and approximately 15 minutes after injection she developed 5-6 hives all over. She was given 50mg  PO Benadryl and Dr. Reymundo Poll, our Radiologist came and evaluated her. She was observed for 1 hour and released without complications. SPM Pt was injected with 44mL of Omnipaque 350 and approximately 15 minutes after injection she developed 5-6 hives all over. She was given 50mg  PO Benadryl and Dr. Reymundo Poll, our Radiologist came and evaluated her. She was observed for 1 hour and released without complications. SPM        Medication List        Accurate as of September 20, 2021  9:03 AM. If you have any questions, ask your nurse or doctor.          aspirin 81 MG chewable tablet Chew by mouth.   chlorthalidone 25 MG tablet Commonly known as: HYGROTON Take 25 mg by mouth every other day.   cholecalciferol 25 MCG (1000 UNIT) tablet Commonly known as: VITAMIN D Take 1,000 Units by mouth daily.   cyclobenzaprine 5 MG tablet Commonly known as: FLEXERIL PLEASE SEE ATTACHED FOR DETAILED DIRECTIONS   famotidine 20 MG tablet Commonly known as: PEPCID Take 20 mg by mouth 2 (two) times daily.   fluticasone 50 MCG/ACT nasal spray Commonly known as: FLONASE Place 2 sprays into both nostrils daily.   magnesium oxide 400 MG tablet Commonly known  as: MAG-OX Take 1 tablet by mouth 2 (two) times daily.   naproxen 500 MG tablet Commonly known as: Naprosyn Take 1 tablet (500 mg total) by mouth 2 (two) times daily with a meal.   nebivolol 5 MG tablet Commonly known as: BYSTOLIC Take 1 tablet (5 mg total) by mouth 2 (two) times daily.   NON FORMULARY Liver Detox 1 capsule qd.   NON FORMULARY Blood sugar support Takes 1 capsule qd.   omeprazole 20 MG capsule Commonly known as: PRILOSEC Take 1 capsule (20 mg total) by mouth 2  (two) times daily before a meal for 14 days.   Potassium Chloride ER 20 MEQ Tbcr TAKE 1 TABLET BY MOUTH AS DIRECTED. TAKE 1 TABLET ONCE DAILY AND 2 TABLETS 2 DAYS A WEEK.   PROBIOTIC DAILY PO Take by mouth.   propranolol 20 MG tablet Commonly known as: INDERAL Take 1 tablet (20 mg total) by mouth 3 (three) times daily as needed. For breakthrough palpitations.   Pylera 140-125-125 MG capsule Generic drug: bismuth-metronidazole-tetracycline Take 3 capsules by mouth 4 (four) times daily -  before meals and at bedtime for 14 days.   rosuvastatin 5 MG tablet Commonly known as: Crestor Take 1 tablet (5 mg total) by mouth at bedtime.   valACYclovir 500 MG tablet Commonly known as: VALTREX Take 500 mg by mouth 2 (two) times daily as needed (for flares).   VITAMIN B 12 PO Take 1 mg by mouth every other day.   vitamin C 100 MG tablet Take 100 mg by mouth daily.        Allergies:  Allergies  Allergen Reactions   Iodinated Diagnostic Agents Hives    Pt was injected with 63mL of Omnipaque 350 and approximately 15 minutes after injection she developed 5-6 hives all over. She was given 50mg  PO Benadryl and Dr. , our Radiologist came and evaluated her. She was observed for 1 hour and released without complications. SPM Pt was injected with 52mL of Omnipaque 350 and approximately 15 minutes after injection she developed 5-6 hives all over. She was given 50mg  PO Benadryl and Dr. 87m, our Radiologist came and evaluated her. She was observed for 1 hour and released without complications. SPM    Family History: Family History  Problem Relation Age of Onset   Heart disease Mother    Diabetes Mother    Hypertension Mother    Heart attack Father    Diabetes Father    Hypertension Father    Breast cancer Neg Hx     Social History:  reports that she quit smoking about 12 years ago. Her smoking use included cigarettes. She has never used smokeless tobacco. She reports current  alcohol use. She reports that she does not use drugs.   Physical Exam: BP 121/82   Pulse 76   Ht 5' (1.524 m)   Wt 177 lb (80.3 kg)   BMI 34.57 kg/m   Constitutional:  Alert and oriented, No acute distress. HEENT: [place] AT, moist mucus membranes.  Trachea midline, no masses. Cardiovascular: No clubbing, cyanosis, or edema. Respiratory: Normal respiratory effort, no increased work of breathing. Psychiatric: Normal mood and affect.  Laboratory Data:  Urinalysis Dipstick trace blood/1+ protein Microscopy negative   Assessment & Plan:   55 y.o. female with intermittent storage related voiding symptoms, dysuria and pelvic pain Recent urinalyses have been negative Presently asymptomatic Does not meet the AUA definition of recurrent UTI (2 culture proven symptomatic acute cystitis episodes in a 59-month period,  or 3 within a 1 year period) Recommend cystoscopy for further evaluation of lower urinary tract Pelvic exam at time of cystoscopy   Abbie Sons, MD  [facility] [address], [place], [place] [address] [phone]

## 2021-09-20 ENCOUNTER — Ambulatory Visit (INDEPENDENT_AMBULATORY_CARE_PROVIDER_SITE_OTHER): Payer: BC Managed Care – PPO | Admitting: Urology

## 2021-09-20 ENCOUNTER — Encounter: Payer: Self-pay | Admitting: Urology

## 2021-09-20 ENCOUNTER — Other Ambulatory Visit: Payer: Self-pay

## 2021-09-20 VITALS — BP 121/82 | HR 76 | Ht 60.0 in | Wt 177.0 lb

## 2021-09-20 DIAGNOSIS — R102 Pelvic and perineal pain: Secondary | ICD-10-CM | POA: Diagnosis not present

## 2021-09-20 DIAGNOSIS — R350 Frequency of micturition: Secondary | ICD-10-CM | POA: Diagnosis not present

## 2021-09-20 DIAGNOSIS — R3915 Urgency of urination: Secondary | ICD-10-CM

## 2021-09-20 LAB — URINALYSIS, COMPLETE
Bilirubin, UA: NEGATIVE
Glucose, UA: NEGATIVE
Ketones, UA: NEGATIVE
Leukocytes,UA: NEGATIVE
Nitrite, UA: NEGATIVE
Specific Gravity, UA: 1.025 (ref 1.005–1.030)
Urobilinogen, Ur: 0.2 mg/dL (ref 0.2–1.0)
pH, UA: 6 (ref 5.0–7.5)

## 2021-09-20 LAB — MICROSCOPIC EXAMINATION: RBC, Urine: NONE SEEN /hpf (ref 0–2)

## 2021-10-22 ENCOUNTER — Other Ambulatory Visit: Payer: BC Managed Care – PPO | Admitting: Urology

## 2021-10-25 ENCOUNTER — Other Ambulatory Visit: Payer: Self-pay | Admitting: Gastroenterology

## 2021-10-25 DIAGNOSIS — A048 Other specified bacterial intestinal infections: Secondary | ICD-10-CM

## 2021-11-01 ENCOUNTER — Ambulatory Visit: Payer: Self-pay | Admitting: Urology

## 2021-11-10 ENCOUNTER — Other Ambulatory Visit: Payer: BC Managed Care – PPO | Admitting: Urology

## 2021-11-12 ENCOUNTER — Encounter: Payer: Self-pay | Admitting: Urology

## 2021-12-29 ENCOUNTER — Encounter: Payer: Self-pay | Admitting: Emergency Medicine

## 2021-12-29 ENCOUNTER — Emergency Department: Payer: BC Managed Care – PPO

## 2021-12-29 ENCOUNTER — Emergency Department
Admission: EM | Admit: 2021-12-29 | Discharge: 2021-12-29 | Disposition: A | Discharge status: Home / Self Care | Payer: BC Managed Care – PPO | Attending: Emergency Medicine | Admitting: Emergency Medicine

## 2021-12-29 ENCOUNTER — Other Ambulatory Visit: Payer: Self-pay

## 2021-12-29 DIAGNOSIS — E119 Type 2 diabetes mellitus without complications: Secondary | ICD-10-CM | POA: Diagnosis not present

## 2021-12-29 DIAGNOSIS — R0789 Other chest pain: Secondary | ICD-10-CM | POA: Diagnosis present

## 2021-12-29 DIAGNOSIS — R42 Dizziness and giddiness: Secondary | ICD-10-CM

## 2021-12-29 DIAGNOSIS — I10 Essential (primary) hypertension: Secondary | ICD-10-CM | POA: Insufficient documentation

## 2021-12-29 DIAGNOSIS — R0602 Shortness of breath: Secondary | ICD-10-CM | POA: Insufficient documentation

## 2021-12-29 DIAGNOSIS — R079 Chest pain, unspecified: Secondary | ICD-10-CM

## 2021-12-29 LAB — CBC
HCT: 36.9 % (ref 36.0–46.0)
Hemoglobin: 12.5 g/dL (ref 12.0–15.0)
MCH: 29.1 pg (ref 26.0–34.0)
MCHC: 33.9 g/dL (ref 30.0–36.0)
MCV: 86 fL (ref 80.0–100.0)
Platelets: 239 10*3/uL (ref 150–400)
RBC: 4.29 MIL/uL (ref 3.87–5.11)
RDW: 12.6 % (ref 11.5–15.5)
WBC: 5.4 10*3/uL (ref 4.0–10.5)
nRBC: 0 % (ref 0.0–0.2)

## 2021-12-29 LAB — BASIC METABOLIC PANEL
Anion gap: 5 (ref 5–15)
BUN: 15 mg/dL (ref 6–20)
CO2: 29 mmol/L (ref 22–32)
Calcium: 9 mg/dL (ref 8.9–10.3)
Chloride: 105 mmol/L (ref 98–111)
Creatinine, Ser: 0.79 mg/dL (ref 0.44–1.00)
GFR, Estimated: >60 mL/min (ref >60)
Glucose, Bld: 120 mg/dL — ABNORMAL HIGH (ref 70–99)
Potassium: 4 mmol/L (ref 3.5–5.1)
Sodium: 139 mmol/L (ref 135–145)

## 2021-12-29 LAB — MAGNESIUM: Magnesium: 2.1 mg/dL (ref 1.7–2.4)

## 2021-12-29 LAB — D-DIMER, QUANTITATIVE: D-Dimer, Quant: <0.27 ug/mL-FEU (ref 0.00–0.50)

## 2021-12-29 LAB — TROPONIN I (HIGH SENSITIVITY): Troponin I (High Sensitivity): 5 ng/L (ref <18)

## 2021-12-29 MED ORDER — LACTATED RINGERS IV BOLUS
1000.0000 mL | Freq: Once | INTRAVENOUS | Status: AC
  Administered 2021-12-29: 1000 mL via INTRAVENOUS

## 2021-12-29 NOTE — ED Triage Notes (Signed)
Pt arrived via POV with reports shortness of breath and discomfort in her chest, pt states the pain in her chest radiates up the L side of her neck. ? ?Pt reports feeling a funny feeling in her chest today. ?

## 2021-12-29 NOTE — ED Provider Notes (Signed)
? ?[facility] ?Provider Note ? ? ? Event Date/Time  ? First MD Initiated Contact with Patient 12/29/21 0149   ?  (approximate) ? ? ?History  ? ?Chest Pain and Shortness of Breath ? ? ?HPI ? ?Felicia Barnett is a 56 y.o. female with past medical history of HTN, HDL, DM, anxiety, SVT, and recent removal of the left facial mucocele by ENT at [facility] last week who presents for evaluation of some lightheadedness, chest discomfort, shortness of breath that started earlier today.  Patient states she was working this afternoon when she got home is resting she started feeling unwell.  She states the chest discomfort is hard to describe but is not particular pain or pressure but radiates up into her left neck.  She feels as her site of biopsy is little swollen.  No cough, fevers, nausea, vomiting, diarrhea, abdominal pain, back pain, headache, earache, sore throat or any redness over the site of biopsy.  She has not followed up with ENT yet.  She did take some potassium which she is post to take as she is on chlorthalidone because she thought her potassium was low.  Denies EtOH use, drug use or tobacco abuse.  No history of cardiac or pulmonary disease.  No other acute concerns at this time. ? ?  ?Past Medical History:  ?Diagnosis Date  ? Anxiety   ? since surgery, pt has had attacks w/ fear of not being able to move her leg (anxiety attacks occur  3-4x month.)  ? Diabetes mellitus without complication (HCC)   ? pre-diabetes  ? H/O prior ablation treatment   ? Heart murmur   ? palpatiations  ? Hyperlipidemia   ? Hypertension   ? SVT (supraventricular tachycardia) (HCC)   ? ? ? ?Physical Exam  ?Triage Vital Signs: ?ED Triage Vitals  ?Enc Vitals Group  ?   BP   ?   Pulse   ?   Resp   ?   Temp   ?   Temp src   ?   SpO2   ?   Weight   ?   Height   ?   Head Circumference   ?   Peak Flow   ?   Pain Score   ?   Pain Loc   ?   Pain Edu?   ?   Excl. in GC?   ? ? ?Most recent vital signs: ?Vitals:  ? 12/29/21 0200  12/29/21 0240  ?BP: (!) 151/103 (!) 140/91  ?Pulse: 68 65  ?Resp: 14 19  ?Temp:    ?SpO2: 100% 100%  ? ? ?General: Awake, no distress.  ?CV:  Good peripheral perfusion.  2+ radial pulses. ?Resp:  Normal effort.  Clear bilaterally. ?Abd:  No distention.  Soft throughout. ?Other:  Cranial nerves II through XII are grossly intact.  No focal extremity deficits.  No pronator drift.  No finger dysmetria.  Sensation is intact to light touch throughout all extremities.  TMs are unremarkable laterally.  Oropharynx is unremarkable.  No erythema bulging or significant tenderness around the mastoid laterally. ? ? ?ED Results / Procedures / Treatments  ?Labs ?(all labs ordered are listed, but only abnormal results are displayed) ?Labs Reviewed  ?BASIC METABOLIC PANEL - Abnormal; Notable for the following components:  ?    Result Value  ? Glucose, Bld 120 (*)   ? All other components within normal limits  ?CBC  ?MAGNESIUM  ?D-DIMER, QUANTITATIVE  ?TROPONIN I (HIGH SENSITIVITY)  ? ? ? ?  EKG ? ?ECG is remarkable for sinus rhythm with a ventricular rate of 55, normal axis, unremarkable intervals without evidence of acute ischemia or significant arrhythmia. ? ? ?RADIOLOGY ?Chest reviewed by myself shows no focal consoidation, effusion, edema, pneumothorax or other clear acute thoracic process. I also reviewed radiology interpretation and agree with findings described. ? ? ? ?PROCEDURES: ? ?Critical Care performed: No ? ?.1-3 Lead EKG Interpretation ?Performed by: Gilles Chiquito, MD ?Authorized by: Gilles Chiquito, MD  ? ?  Interpretation: normal   ?  ECG rate assessment: normal   ?  Rhythm: sinus rhythm   ?  Ectopy: none   ?  Conduction: normal   ? ?The patient is on the cardiac monitor to evaluate for evidence of arrhythmia and/or significant heart rate changes. ? ?MEDICATIONS ORDERED IN ED: ?Medications  ?lactated ringers bolus 1,000 mL (1,000 mLs Intravenous Bolus 12/29/21 0213)  ? ? ? ?IMPRESSION / MDM / ASSESSMENT AND PLAN / ED  COURSE  ?I reviewed the triage vital signs and the nursing notes. ?             ?               ? ?Differential diagnosis includes, but is not limited to ACS, arrhythmia, PE, pneumonia, anemia, metabolic derangements, dehydration with a lower suspicion for dissection at this time.  No evidence on exam of mastoiditis, otitis media or other clear acute infectious process in the neck.  Incision site patient and her review of mucocele removal appears clean dry and intact with no overlying skin changes but very slight barely perceptible fluctuance underneath.  Unclear etiology for this is possible there is some serous fluid versus small hematoma with lower suspicion for abscess.  We will have patient follow-up with ENT to have this evaluated.  Do not believe requires further emergent work-up in emergency room. ?  ?ECG is remarkable for sinus rhythm with a ventricular rate of 55, normal axis, unremarkable intervals without evidence of acute ischemia or significant arrhythmia. ? ?Chest reviewed by myself shows no focal consoidation, effusion, edema, pneumothorax or other clear acute thoracic process. I also reviewed radiology interpretation and agree with findings described. ? ?BMP shows no significant electrolyte or metabolic derangements.  CBC shows no leukocytosis or acute anemia.  Magnesium is within normal limits.  D-dimer is undetectable and this time a very low suspicion for PE or other coagulative process at this time. ? ?After some IV fluids patient states she is feeling much better.  Suspect she may have been mildly dehydrated.  Her blood pressure improved.  At this time I have a low suspicion for immediate life-threatening process and will consider observation and further diagnostic studies I think she is stable for discharge with outpatient PCP and ENT follow-up.  Advised to have her blood pressure rechecked.  Discussed returning immediately for any new or worsening of symptoms.  Discharged in stable condition.   Strict return precautions advised and discussed. ? ? ?FINAL CLINICAL IMPRESSION(S) / ED DIAGNOSES  ? ?Final diagnoses:  ?Chest pain, unspecified type  ?SOB (shortness of breath)  ?Lightheaded  ?Hypertension, unspecified type  ? ? ? ?Rx / DC Orders  ? ?ED Discharge Orders   ? ? None  ? ?  ? ? ? ?Note:  This document was prepared using Dragon voice recognition software and may include unintentional dictation errors. ?  ?Gilles Chiquito, MD ?12/29/21 0304 ? ?

## 2022-01-03 ENCOUNTER — Telehealth: Payer: Self-pay | Admitting: Cardiovascular Disease

## 2022-01-03 NOTE — Telephone Encounter (Signed)
STAT if patient feels like he/she is going to faint  ? ?Are you dizzy now? yes ? ?Do you feel faint or have you passed out? yes ? ?Do you have any other symptoms? Heart jumps ? ?Have you checked your HR and BP (record if available)? 135/86 ? ?Patient scheduled 04/06 with Peggyann Juba but wants sooner.  Transferred to Maralyn Sago  ? ?

## 2022-01-03 NOTE — Telephone Encounter (Signed)
Spoke with patient.  ? ?Patient reports that she has been dizzy for several days, states that she feels off balance.  ? ?Denies N/V, passing out, blurred vision.  ? ?Pt kept talking about prior ER visits and low potassium, however looking at notes from ER visits non had anything to do with electrolyte abnormalities and Gollan's last OV note says nothing about hypokalemia.  ? ?Pt is concerned that she needs to stop taking her chlorthalidone. She states that Dr. Mariah Milling had her start taking it every other day and that "Dr. Mariah Milling wasn't even the one who started me on it, it was my PCP from many, many years ago."  ? ?Also reports that after she taked the chlorthalidone she pees a lot. Explained that that was normal, as it is a fluid pill. Pt states she knows but she's worried that she's dehydrated, as she was told she might have been in the ER on 3/22.  ? ?Told patient that I would forward her concern to Dr. Mariah Milling for him to review. Strict ER precautions given. Pt verbalized understanding.  ?

## 2022-01-04 ENCOUNTER — Emergency Department
Admission: EM | Admit: 2022-01-04 | Discharge: 2022-01-04 | Disposition: A | Discharge status: Home / Self Care | Payer: BC Managed Care – PPO | Attending: Emergency Medicine | Admitting: Emergency Medicine

## 2022-01-04 ENCOUNTER — Other Ambulatory Visit: Payer: Self-pay

## 2022-01-04 DIAGNOSIS — Z711 Person with feared health complaint in whom no diagnosis is made: Secondary | ICD-10-CM

## 2022-01-04 DIAGNOSIS — Z79899 Other long term (current) drug therapy: Secondary | ICD-10-CM | POA: Insufficient documentation

## 2022-01-04 DIAGNOSIS — R42 Dizziness and giddiness: Secondary | ICD-10-CM | POA: Insufficient documentation

## 2022-01-04 DIAGNOSIS — I10 Essential (primary) hypertension: Secondary | ICD-10-CM | POA: Diagnosis not present

## 2022-01-04 DIAGNOSIS — E875 Hyperkalemia: Secondary | ICD-10-CM | POA: Insufficient documentation

## 2022-01-04 DIAGNOSIS — T50905A Adverse effect of unspecified drugs, medicaments and biological substances, initial encounter: Secondary | ICD-10-CM | POA: Insufficient documentation

## 2022-01-04 DIAGNOSIS — T887XXA Unspecified adverse effect of drug or medicament, initial encounter: Secondary | ICD-10-CM | POA: Insufficient documentation

## 2022-01-04 DIAGNOSIS — R350 Frequency of micturition: Secondary | ICD-10-CM | POA: Insufficient documentation

## 2022-01-04 DIAGNOSIS — M7989 Other specified soft tissue disorders: Secondary | ICD-10-CM | POA: Insufficient documentation

## 2022-01-04 DIAGNOSIS — F419 Anxiety disorder, unspecified: Secondary | ICD-10-CM | POA: Diagnosis not present

## 2022-01-04 LAB — URINALYSIS, ROUTINE W REFLEX MICROSCOPIC
Bilirubin Urine: NEGATIVE
Glucose, UA: NEGATIVE mg/dL
Hgb urine dipstick: NEGATIVE
Ketones, ur: NEGATIVE mg/dL
Leukocytes,Ua: NEGATIVE
Nitrite: NEGATIVE
Protein, ur: NEGATIVE mg/dL
Specific Gravity, Urine: 1.009 (ref 1.005–1.030)
pH: 6 (ref 5.0–8.0)

## 2022-01-04 LAB — BASIC METABOLIC PANEL
Anion gap: 6 (ref 5–15)
BUN: 12 mg/dL (ref 6–20)
CO2: 29 mmol/L (ref 22–32)
Calcium: 9.2 mg/dL (ref 8.9–10.3)
Chloride: 103 mmol/L (ref 98–111)
Creatinine, Ser: 0.65 mg/dL (ref 0.44–1.00)
GFR, Estimated: >60 mL/min (ref >60)
Glucose, Bld: 111 mg/dL — ABNORMAL HIGH (ref 70–99)
Potassium: 3.8 mmol/L (ref 3.5–5.1)
Sodium: 138 mmol/L (ref 135–145)

## 2022-01-04 LAB — CBC
HCT: 36.1 % (ref 36.0–46.0)
Hemoglobin: 12.3 g/dL (ref 12.0–15.0)
MCH: 29.5 pg (ref 26.0–34.0)
MCHC: 34.1 g/dL (ref 30.0–36.0)
MCV: 86.6 fL (ref 80.0–100.0)
Platelets: 239 10*3/uL (ref 150–400)
RBC: 4.17 MIL/uL (ref 3.87–5.11)
RDW: 12.5 % (ref 11.5–15.5)
WBC: 6.1 10*3/uL (ref 4.0–10.5)
nRBC: 0 % (ref 0.0–0.2)

## 2022-01-04 NOTE — ED Triage Notes (Signed)
Ambulatory to triage with c/o dizziness and feeling off balance. Pt states shes been taking diuretic but recently stopped taking it because of frequent urination. Pt states she feels like she needs medication changes. Saw PCP today and was to start Losartan tomorrow.  ?

## 2022-01-04 NOTE — ED Provider Notes (Signed)
? ?[facility] ?Provider Note ? ? ? Event Date/Time  ? First MD Initiated Contact with Patient 01/04/22 2118   ?  (approximate) ? ? ?History  ? ?Dizziness ? ? ?HPI ? ?Felicia Barnett is a 56 y.o. female who presents to the ED for evaluation of Dizziness ?  ?I reviewed telephone encounter documented yesterday with cardiology group clinic.  Feeling dizzy for multiple days and off balance.  ?Hx anxiety.  ? ?Patient presents to the ED for evaluation of dizziness and scared to take her losartan.  She reports chronic urinary frequency without dysuria that she attributes to her chlorthalidone.  Reports having to take potassium supplements and concerned that her potassium is "off." ? ?Reports that she saw her PCP earlier today who advised her to stop taking the chlorthalidone and she was provided a prescription for losartan to replace this.  Patient reports she picked up this prescription, but is scared to start this medication because she is uncertain of any side effects.  She reports reading "reviews online" of her losartan and is scared of hyperkalemia and lower extremity swelling. ? ?When thinking of this, she develops palpitations and dizziness.  Denies any presyncope or vertigo, just "feels dizzy."  Reports ambulating at her baseline without falls or syncope. ? ?Denies chest pain, dyspnea, cough, fever or recent illnesses. ? ?Physical Exam  ? ?Triage Vital Signs: ?ED Triage Vitals [01/04/22 2029]  ?Enc Vitals Group  ?   BP (!) 155/93  ?   Pulse Rate 77  ?   Resp 18  ?   Temp 98.2 ?F (36.8 ?C)  ?   Temp Source Oral  ?   SpO2   ?   Weight 175 lb (79.4 kg)  ?   Height 5' (1.524 m)  ?   Head Circumference   ?   Peak Flow   ?   Pain Score 0  ?   Pain Loc   ?   Pain Edu?   ?   Excl. in GC?   ? ? ?Most recent vital signs: ?Vitals:  ? 01/04/22 2029 01/04/22 2130  ?BP: (!) 155/93 125/72  ?Pulse: 77 67  ?Resp: 18 (!) 22  ?Temp: 98.2 ?F (36.8 ?C)   ?SpO2:  97%  ? ? ?General: Awake, no distress.   Ambulatory with normal gait. ?CV:  Good peripheral perfusion. RRR ?Resp:  Normal effort.  ?Abd:  No distention.  ?MSK:  No deformity noted.  ?Neuro:  No focal deficits appreciated. Cranial nerves II through XII intact ?5/5 strength and sensation in all 4 extremities ?Other:   ? ? ?ED Results / Procedures / Treatments  ? ?Labs ?(all labs ordered are listed, but only abnormal results are displayed) ?Labs Reviewed  ?BASIC METABOLIC PANEL - Abnormal; Notable for the following components:  ?    Result Value  ? Glucose, Bld 111 (*)   ? All other components within normal limits  ?URINALYSIS, ROUTINE W REFLEX MICROSCOPIC - Abnormal; Notable for the following components:  ? Color, Urine STRAW (*)   ? APPearance CLEAR (*)   ? All other components within normal limits  ?CBC  ? ? ?EKG ?Sinus rhythm, rate of 70 bpm.  Normal axis and intervals.  No evidence of acute ischemia. ? ?RADIOLOGY ? ? ?Official radiology report(s): ?No results found. ? ?PROCEDURES and INTERVENTIONS: ? ?.1-3 Lead EKG Interpretation ?Performed by: Delton Prairie, MD ?Authorized by: Delton Prairie, MD  ? ?  Interpretation: normal   ?  ECG rate:  68 ?  ECG rate assessment: normal   ?  Rhythm: sinus rhythm   ?  Ectopy: none   ?  Conduction: normal   ? ?Medications - No data to display ? ? ?IMPRESSION / MDM / ASSESSMENT AND PLAN / ED COURSE  ?I reviewed the triage vital signs and the nursing notes. ? ?Patient presents to the ED for evaluation of dizziness and concern for side effects with starting a new antihypertensive, without evidence of acute pathology and suitable for outpatient management.  Normal exam aside from her anxiety.  No evidence of psychiatric emergency to necessitate emergent psychiatric referral or IVC.  She is ambulatory at her baseline with a normal gait.  EKG is nonischemic and has no dysrhythmias on the monitor.  Normal CBC and metabolic panel.  Clear urine without evidence of cystitis contributing to her urinary frequency.  I assured her that  it is safe to start her losartan.  We discussed stopping her potassium supplementation alongside chlorthalidone.  We discussed appropriate return precautions to the ED and she is suitable for outpatient management. ? ?  ? ? ?FINAL CLINICAL IMPRESSION(S) / ED DIAGNOSES  ? ?Final diagnoses:  ?Dizziness  ?Anxiety  ?Concern about drug reaction without diagnosis  ? ? ? ?Rx / DC Orders  ? ?ED Discharge Orders   ? ? None  ? ?  ? ? ? ?Note:  This document was prepared using Dragon voice recognition software and may include unintentional dictation errors. ?  ?Delton Prairie, MD ?01/04/22 2218 ? ?

## 2022-01-04 NOTE — Discharge Instructions (Signed)
Stop chlorthalidone.  Stop potassium supplements. ? ?Start taking losartan every day.  It is safe to take this medication. ?

## 2022-01-06 NOTE — Telephone Encounter (Signed)
Scheduled 3-31 to see Gollan .  ?

## 2022-01-07 ENCOUNTER — Encounter: Payer: Self-pay | Admitting: Cardiovascular Disease

## 2022-01-07 ENCOUNTER — Encounter: Payer: Self-pay | Admitting: *Deleted

## 2022-01-07 ENCOUNTER — Ambulatory Visit: Payer: BC Managed Care – PPO | Admitting: Cardiovascular Disease

## 2022-01-07 VITALS — BP 126/78 | HR 71 | Ht 60.0 in | Wt 178.0 lb

## 2022-01-07 DIAGNOSIS — R079 Chest pain, unspecified: Secondary | ICD-10-CM

## 2022-01-07 DIAGNOSIS — I479 Paroxysmal tachycardia, unspecified: Secondary | ICD-10-CM | POA: Diagnosis not present

## 2022-01-07 DIAGNOSIS — I10 Essential (primary) hypertension: Secondary | ICD-10-CM

## 2022-01-07 DIAGNOSIS — E782 Mixed hyperlipidemia: Secondary | ICD-10-CM | POA: Diagnosis not present

## 2022-01-07 NOTE — Progress Notes (Signed)
? ?Date:  01/07/2022  ? ?ID:  Lesia Hausen, DOB: 1966/07/18, MRN: [id number] ? ?PCP:  Emogene Morgan, MD  ? ?Chief Complaint  ?Patient presents with  ? OTHER  ?  Patient c/o feeling dizzy and think it may be due to her BP medications. She has been to the ED and she was dehydrated. Medications verbally reviewed with patient  ? ? ?HPI:  ?Ms. Bennette is a 56 y.o. female with a history of: ?Hypertension ?Diabetes ?Palpitations ?Hyperlipidemia  ?Anxiety about health ?Smoker, quit 56 yo ?OSA on CPAP ?Covid Jan 2022 ?Who presents to the office today for dizziness, concern about her blood pressure ? ?In the ER 01/04/22 ?Reported having symptoms of dizziness ?chronic urinary frequency without dysuria that she attributes to her chlorthalidone. ?Feels the chlorthalidone was affecting her electrolytes ?BMP reviewed, normal numbers ? ?Chlorthalidone held by PMD ?It was recommended she take losartan ?Did not start, read the side effects online and was concerned ? ?She went back to 1/2 dose chlorthalidone every other day ?Has not been checking blood pressure at home ? ?Trying to lose weight ?Continues to use CPAP for sleep apnea ? ?Orthostatics in the office today ?Supine 120/72 heart rate 70 ?Sitting 111/71 heart rate 73 ?Standing 124/72 heart rate 72 ?Standing 3 minutes 132/83 heart rate 67 ? ?EKG personally reviewed by myself on todays visit ?Shows normal sinus rhythm rate 71 bpm no significant ST or T wave changes ? ?Cardiac CTA August 2021,  ?No coronary calcification, no coronary disease ? ?Seen in the emergency room January 31, 2021 for various issues, diarrhea, palpitations ? ?zio monitor  for significant arrhythmia, reviewed ?Rare short runs SVT/atrial tachycardia longest was 7 beatsSelect patient triggered events (2) associated with short runs of atrial tachycardia, most triggered events associated with normal sinus rhythm. ? ?Other past medical history reviewed ?CT abdomen 2019 reviewed with minimal aortic  atherosclerosis ?Prior CT chest 2017 with minimal coronary calcification ? ?Long history of palpitations, taking Bystolic 5 twice daily. ?Prior Holter with PVCs ? ? ?PMH:   has a past medical history of Anxiety, Diabetes mellitus without complication (HCC), H/O prior ablation treatment, Heart murmur, Hyperlipidemia, Hypertension, and SVT (supraventricular tachycardia) (HCC). ? ?PSH:    ?Past Surgical History:  ?Procedure Laterality Date  ? ABDOMINAL HYSTERECTOMY    ? BREAST BIOPSY Right   ? neg,excisional bx  ? cardial ablation    ? anxiety attack (heart palpitations HR=210 bpm) the day she was released from the hospital from surgery. Findings were neg .  ? COLONOSCOPY    ? ESOPHAGOGASTRODUODENOSCOPY (EGD) WITH PROPOFOL N/A 08/12/2020  ? Procedure: ESOPHAGOGASTRODUODENOSCOPY (EGD) WITH PROPOFOL;  Surgeon: Toney Reil, MD;  Location: [facility] ENDOSCOPY;  Service: Gastroenterology;  Laterality: N/A;  ? ? ?Current Outpatient Medications  ?Medication Sig Dispense Refill  ? Ascorbic Acid (VITAMIN C) 100 MG tablet Take 100 mg by mouth daily.    ? chlorthalidone (HYGROTON) 25 MG tablet Take 25 mg by mouth every other day.     ? cholecalciferol (VITAMIN D) 25 MCG (1000 UT) tablet Take 1,000 Units by mouth daily.    ? Cyanocobalamin (VITAMIN B 12 PO) Take 1 mg by mouth every other day.     ? famotidine (PEPCID) 20 MG tablet Take 20 mg by mouth 2 (two) times daily.    ? magnesium oxide (MAG-OX) 400 MG tablet Take 1 tablet by mouth 2 (two) times daily.    ? nebivolol (BYSTOLIC) 5 MG tablet Take 1 tablet (5 mg  total) by mouth 2 (two) times daily. 180 tablet 3  ? Potassium Chloride ER 20 MEQ TBCR TAKE 1 TABLET BY MOUTH AS DIRECTED. TAKE 1 TABLET ONCE DAILY AND 2 TABLETS 2 DAYS A WEEK. 35 tablet 6  ? Probiotic Product (PROBIOTIC DAILY PO) Take by mouth.    ? propranolol (INDERAL) 20 MG tablet Take 1 tablet (20 mg total) by mouth 3 (three) times daily as needed. For breakthrough palpitations. 90 tablet 3  ? rosuvastatin  (CRESTOR) 5 MG tablet Take 1 tablet (5 mg total) by mouth at bedtime. 90 tablet 3  ? valACYclovir (VALTREX) 500 MG tablet Take 500 mg by mouth 2 (two) times daily as needed (for flares).     ? aspirin 81 MG chewable tablet Chew by mouth. (Patient not taking: Reported on 01/07/2022)    ? bismuth-metronidazole-tetracycline (PYLERA) 140-125-125 MG capsule Take 3 capsules by mouth 4 (four) times daily -  before meals and at bedtime for 14 days. 168 capsule 0  ? cyclobenzaprine (FLEXERIL) 5 MG tablet PLEASE SEE ATTACHED FOR DETAILED DIRECTIONS (Patient not taking: Reported on 01/07/2022)    ? fluticasone (FLONASE) 50 MCG/ACT nasal spray Place 2 sprays into both nostrils daily. (Patient not taking: Reported on 01/07/2022)    ? naproxen (NAPROSYN) 500 MG tablet Take 1 tablet (500 mg total) by mouth 2 (two) times daily with a meal. (Patient not taking: Reported on 01/07/2022) 30 tablet 0  ? NON FORMULARY Liver Detox 1 capsule qd. (Patient not taking: Reported on 01/07/2022)    ? NON FORMULARY Blood sugar support Takes 1 capsule qd.    ? omeprazole (PRILOSEC) 20 MG capsule Take 1 capsule (20 mg total) by mouth 2 (two) times daily before a meal for 14 days. 28 capsule 0  ? ?No current facility-administered medications for this visit.  ? ? ?ALLERGIES:   Iodinated contrast media  ? ?SOCIAL HISTORY:  The patient  reports that she quit smoking about 12 years ago. Her smoking use included cigarettes. She has never used smokeless tobacco. She reports current alcohol use. She reports that she does not use drugs.  ? ?FAMILY HISTORY:   family history includes Diabetes in her father and mother; Heart attack in her father; Heart disease in her mother; Hypertension in her father and mother.  ? ? ?REVIEW OF SYSTEMS: ?Review of Systems  ?Constitutional: Negative.   ?HENT: Negative.    ?Eyes: Negative.   ?Respiratory: Negative.    ?Cardiovascular: Negative.  Negative for leg swelling.  ?Gastrointestinal: Negative.   ?Genitourinary: Negative.    ?Musculoskeletal: Negative.   ?Neurological:  Positive for dizziness.  ?Psychiatric/Behavioral: Negative.    ?All other systems reviewed and are negative. ? ?PHYSICAL EXAM: ?VS:  BP 126/78 (BP Location: Right Arm, Patient Position: Sitting, Cuff Size: Normal)   Pulse 71   Ht 5' (1.524 m)   Wt 80.7 kg   SpO2 98%   BMI 34.76 kg/m?  , BMI Body mass index is 34.76 kg/m?Marland Kitchen ?Constitutional:  oriented to person, place, and time. No distress.  ?HENT:  ?Head: Grossly normal ?Eyes:  no discharge. No scleral icterus.  ?Neck: No JVD, no carotid bruits  ?Cardiovascular: Regular rate and rhythm, no murmurs appreciated ?Pulmonary/Chest: Clear to auscultation bilaterally, no wheezes or rails ?Abdominal: Soft.  no distension.  no tenderness.  ?Musculoskeletal: Normal range of motion ?Neurological:  normal muscle tone. Coordination normal. No atrophy ?Skin: Skin warm and dry ?Psychiatric: normal affect, pleasant ? ?RECENT LABS: ?09/05/2021: ALT 20 ?12/29/2021: Magnesium 2.1 ?01/04/2022:  BUN 12; Creatinine, Ser 0.65; Hemoglobin 12.3; Platelets 239; Potassium 3.8; Sodium 138  ? ? ?LIPID PANEL: ?Lab Results  ?Component Value Date  ? CHOL 236 (H) 10/23/2019  ? HDL 41 10/23/2019  ? LDLCALC 144 (H) 10/23/2019  ? TRIG 282 (H) 10/23/2019  ? ?  ? ?WEIGHT: ?Wt Readings from Last 3 Encounters:  ?01/07/22 80.7 kg  ?01/04/22 79.4 kg  ?12/29/21 79.4 kg  ?  ? ?ASSESSMENT AND PLAN: ? ?Paroxysmal tachycardia (HCC) ?Symptoms controlled on current regimen of beta-blocker ?On bystolic BID, prorpanolol PRN ? ?Palpitations - ?Rare episodes of tachycardia, on monitor ?No further work-up needed ? ?Insomnia ?History of sleep apnea on CPAP ? ?Essential hypertension ?Blood pressure well controlled, recommended diet, weight loss, exercise program, low-salt ?Continue bystolic 5 mg every other day, chlorthalidone half dose every other day ?Electrolytes are normal on lab work, discussed in detail ? ?Atypical chest pain ?Cardiac CTA previously ordered, no  coronary calcification, normal coronary arteries,  ?No further work-up ? ?Anxiety concerning health ?Reassurance provided ? ?Total encounter time more than 30 minutes. ?Greater than 50% was spent in counseling and coordination of care w

## 2022-01-07 NOTE — Patient Instructions (Addendum)
Ask Primary MD about Ozempic ? ? ?Medication Instructions:  ?No changes ? ?If you need a refill on your cardiac medications before your next appointment, please call your pharmacy.  ? ? ?Lab work: ?No new labs needed ? ? ?Testing/Procedures: ?No new testing needed ? ? ?Follow-Up: ?At [facility], you and your health needs are our priority.  As part of our continuing mission to provide you with exceptional heart care, we have created designated Provider Care Teams.  These Care Teams include your primary Cardiologist (physician) and Advanced Practice Providers (APPs -  Physician Assistants and Nurse Practitioners) who all work together to provide you with the care you need, when you need it. ? ?You will need a follow up appointment in 12 months ? ?Providers on your designated Care Team:   ?Nicolasa Ducking, NP ?Eula Listen, PA-C ?Cadence Fransico Michael, PA-C ? ?COVID-19 Vaccine Information can be found at: [email or web address] For questions related to vaccine distribution or appointments, please email [email or web address] or call [phone].  ? ?

## 2022-01-13 ENCOUNTER — Ambulatory Visit: Payer: BC Managed Care – PPO | Admitting: Medical

## 2022-01-22 ENCOUNTER — Emergency Department: Payer: BC Managed Care – PPO

## 2022-01-22 ENCOUNTER — Encounter: Payer: Self-pay | Admitting: Emergency Medicine

## 2022-01-22 ENCOUNTER — Emergency Department
Admission: EM | Admit: 2022-01-22 | Discharge: 2022-01-22 | Disposition: A | Discharge status: Home / Self Care | Payer: BC Managed Care – PPO | Attending: Emergency Medicine | Admitting: Emergency Medicine

## 2022-01-22 ENCOUNTER — Other Ambulatory Visit: Payer: Self-pay

## 2022-01-22 DIAGNOSIS — I10 Essential (primary) hypertension: Secondary | ICD-10-CM | POA: Diagnosis not present

## 2022-01-22 DIAGNOSIS — E119 Type 2 diabetes mellitus without complications: Secondary | ICD-10-CM | POA: Diagnosis not present

## 2022-01-22 DIAGNOSIS — R002 Palpitations: Secondary | ICD-10-CM

## 2022-01-22 LAB — CBC
HCT: 36.1 % (ref 36.0–46.0)
Hemoglobin: 12 g/dL (ref 12.0–15.0)
MCH: 29.5 pg (ref 26.0–34.0)
MCHC: 33.2 g/dL (ref 30.0–36.0)
MCV: 88.7 fL (ref 80.0–100.0)
Platelets: 214 10*3/uL (ref 150–400)
RBC: 4.07 MIL/uL (ref 3.87–5.11)
RDW: 12.5 % (ref 11.5–15.5)
WBC: 4.9 10*3/uL (ref 4.0–10.5)
nRBC: 0 % (ref 0.0–0.2)

## 2022-01-22 LAB — BASIC METABOLIC PANEL
Anion gap: 5 (ref 5–15)
BUN: 17 mg/dL (ref 6–20)
CO2: 26 mmol/L (ref 22–32)
Calcium: 8.9 mg/dL (ref 8.9–10.3)
Chloride: 107 mmol/L (ref 98–111)
Creatinine, Ser: 0.73 mg/dL (ref 0.44–1.00)
GFR, Estimated: >60 mL/min (ref >60)
Glucose, Bld: 135 mg/dL — ABNORMAL HIGH (ref 70–99)
Potassium: 4.1 mmol/L (ref 3.5–5.1)
Sodium: 138 mmol/L (ref 135–145)

## 2022-01-22 LAB — TROPONIN I (HIGH SENSITIVITY): Troponin I (High Sensitivity): 5 ng/L (ref <18)

## 2022-01-22 MED ORDER — ACETAMINOPHEN 500 MG PO TABS: 1000.0000 mg | ORAL_TABLET | Freq: Once | ORAL | Status: DC

## 2022-01-22 NOTE — Discharge Instructions (Signed)
Your blood work and EKG were all reassuring.  Please follow-up with your cardiologist and primary care provider for further management of your blood pressure. ?

## 2022-01-22 NOTE — ED Triage Notes (Signed)
Pt reports she was watching  TV at home and developed palpitations and feeling dizzy. Pt reports she checked BP at home and was 200/104. Pt reports she was taking a diuretic and MD stopped. Pt reports she started to have a headache. Pt talks in complete sentences no distress noted.  ?

## 2022-01-22 NOTE — ED Notes (Signed)
Discharge instructions provided to patient along with follow-up information. Patient verbalized information. Patient ambulated with a steady gait out to the waiting room ?

## 2022-01-22 NOTE — ED Notes (Signed)
Per ems pt with hypertension, palpitations. Ems states vitals 169/103, hr 80, pox 98% on ra. Per ems pt has been having diarrhea. EKG normal per ems.  ?

## 2022-01-22 NOTE — ED Notes (Signed)
Pt reports she took 4 baby aspirins  ?

## 2022-01-23 NOTE — ED Provider Notes (Signed)
? ?[facility] ?Provider Note ? ? ? Event Date/Time  ? First MD Initiated Contact with Patient 01/22/22 0253   ?  (approximate) ? ? ?History  ? ?No chief complaint on file. ? ? ?HPI ? ?Felicia Barnett is a 56 y.o. female  with pmh of HTN who presents with palpitations and HA. Patient was in her normal state of health when she started to have a frontal HA. She then checked her BP and it was 200 systolic, after which she developed palpitations associated with mild chest tightness and lightheadedness. No SOB. Did not synopsize. Symptoms lasted for about an hr. She denies associated visual change, numbness, tingling or weakness. Patient recently changed hypertension meds. Had been on beta blocker and diuretic, but diuretic was stopped due to dehydration and she was prescribed losartan instead but has not started it bc she is concerned about possible side effects.   ? ?  ? ?Past Medical History:  ?Diagnosis Date  ? Anxiety   ? since surgery, pt has had attacks w/ fear of not being able to move her leg (anxiety attacks occur  3-4x month.)  ? Diabetes mellitus without complication (HCC)   ? pre-diabetes  ? H/O prior ablation treatment   ? Heart murmur   ? palpatiations  ? Hyperlipidemia   ? Hypertension   ? SVT (supraventricular tachycardia) (HCC)   ? ? ?Patient Active Problem List  ? Diagnosis Date Noted  ? History of Helicobacter pylori infection   ? Paroxysmal tachycardia (HCC) 12/24/2018  ? Chest pain 12/24/2018  ? Palpitations 12/24/2018  ? Essential hypertension 12/24/2018  ? Low potassium syndrome 12/24/2018  ? PVC's (premature ventricular contractions) 12/24/2018  ? Other fatigue 12/24/2018  ? Lower extremity neuropathy 08/07/2015  ? Paresthesia of left leg 07/14/2015  ? Anxiety 10/29/2013  ? Hyperlipidemia 10/29/2013  ? ? ? ?Physical Exam  ?Triage Vital Signs: ?ED Triage Vitals  ?Enc Vitals Group  ?   BP 01/22/22 0120 (!) 147/85  ?   Pulse Rate 01/22/22 0120 67  ?   Resp 01/22/22 0120 16  ?    Temp 01/22/22 0120 98.5 ?F (36.9 ?C)  ?   Temp Source 01/22/22 0120 Oral  ?   SpO2 01/22/22 0120 96 %  ?   Weight 01/22/22 0122 176 lb (79.8 kg)  ?   Height 01/22/22 0122 5' (1.524 m)  ?   Head Circumference --   ?   Peak Flow --   ?   Pain Score 01/22/22 0131 5  ?   Pain Loc --   ?   Pain Edu? --   ?   Excl. in GC? --   ? ? ?Most recent vital signs: ?Vitals:  ? 01/22/22 0120 01/22/22 0400  ?BP: (!) 147/85 (!) 151/92  ?Pulse: 67 65  ?Resp: 16 16  ?Temp:    ?SpO2: 96% 98%  ? ? ? ?General: Awake, no distress.  ?CV:  Good peripheral perfusion. No edema ?Resp:  Normal effort.  ?Abd:  No distention.  ?Neuro:             Awake, Alert, Oriented x 3  ?Other:  Aox3, nml speech  ?PERRL, EOMI, face symmetric, nml tongue movement  ?5/5 strength in the BL upper and lower extremities  ?Sensation grossly intact in the BL upper and lower extremities  ?Finger-nose-finger intact BL ? ? ?ED Results / Procedures / Treatments  ?Labs ?(all labs ordered are listed, but only abnormal results are displayed) ?  Labs Reviewed  ?BASIC METABOLIC PANEL - Abnormal; Notable for the following components:  ?    Result Value  ? Glucose, Bld 135 (*)   ? All other components within normal limits  ?CBC  ?CBG MONITORING, ED  ?TROPONIN I (HIGH SENSITIVITY)  ? ? ? ?EKG ? ?EKG interpretation performed by myself: NSR, nml axis, nml intervals, no acute ischemic changes ? ? ? ?RADIOLOGY ?I reviewed the CXR which does not show any acute cardiopulmonary process; agree with radiology report  ? ? ? ?PROCEDURES: ? ?Critical Care performed: No ? ?Procedures ? ?The patient is on the cardiac monitor to evaluate for evidence of arrhythmia and/or significant heart rate changes. ? ? ?MEDICATIONS ORDERED IN ED: ?Medications - No data to display ? ? ?IMPRESSION / MDM / ASSESSMENT AND PLAN / ED COURSE  ?I reviewed the triage vital signs and the nursing notes. ?             ?               ? ?Differential diagnosis includes, but is not limited to, migraine, arrhythmia,  anxiety, less likely hypertensive emergency   ? ? ?56 yo female presenting with HA and palpations. HA started first, and when she checked her BP it was high in the 200s. She then developed palpitations with some mild chest tightness. Symptoms have now resolved. She had no associated neurologic changes to suggest TIA or CVA and neuro exam is normal currently.  My suspicion for ACS is low given quality of symptoms, and ekg is wnl and trop is negative. Electrolytes and CBC wnl. Pts symptoms o not suggest hypertensive emergency, and BP is not very high in the ED- around 150/90.  She appears well now and is asymptomatic. Symptoms mau have been triggered by anxiety over BP being high, as pt is very preoccupied with fluctuations in her pressure and the different medication changes she has had recently.  Cardiac arhythmia also possible. Advised that she f/u with her cardiologist who is managing her meds.  She is otherwise appropriate for dc.  ?FINAL CLINICAL IMPRESSION(S) / ED DIAGNOSES  ? ?Final diagnoses:  ?Hypertension, unspecified type  ?Palpitations  ? ? ? ?Rx / DC Orders  ? ?ED Discharge Orders   ? ? None  ? ?  ? ? ? ?Note:  This document was prepared using Dragon voice recognition software and may include unintentional dictation errors. ?  ?Georga Hacking, MD ?01/23/22 1634 ? ?

## 2022-01-30 ENCOUNTER — Other Ambulatory Visit: Payer: Self-pay

## 2022-01-30 ENCOUNTER — Emergency Department
Admission: EM | Admit: 2022-01-30 | Discharge: 2022-01-30 | Disposition: A | Discharge status: Home / Self Care | Payer: BC Managed Care – PPO | Attending: Emergency Medicine | Admitting: Emergency Medicine

## 2022-01-30 DIAGNOSIS — R519 Headache, unspecified: Secondary | ICD-10-CM | POA: Diagnosis present

## 2022-01-30 DIAGNOSIS — I10 Essential (primary) hypertension: Secondary | ICD-10-CM

## 2022-01-30 MED ORDER — ACETAMINOPHEN 325 MG PO TABS
650.0000 mg | ORAL_TABLET | Freq: Once | ORAL | Status: AC
  Administered 2022-01-30: 650 mg via ORAL
  Filled 2022-01-30: qty 2

## 2022-01-30 NOTE — ED Triage Notes (Signed)
Pt states she started having HA a few days ago and noticed her BP has been elevated- pt states that her BP at home was 120 diastolic- pt states it is causing numbness in her L arm- pt states she does take BP medications and has not missed any doses- pt states she also has diarrhea ?

## 2022-01-30 NOTE — ED Provider Notes (Signed)
? ?  [facility] ?Provider Note ? ? ? Event Date/Time  ? First MD Initiated Contact with Patient 01/30/22 2147   ?  (approximate) ? ? ?History  ? ?Headache and Hypertension ? ? ?HPI ? ?Felicia Barnett is a 56 y.o. female with a history of high blood pressure presents with complaints of hypertension.  Patient reports she has been having intermittent headaches.  Today she got an argument with her daughter, was having tension in her head checked her blood pressure and found to be elevated.  She is concerned because her diastolic was over [id number].  She reports compliance with her blood pressure medications.  She is feeling better now.  No chest pain.  No nausea or vomiting.  No neurodeficits ?  ? ? ?Physical Exam  ? ?Triage Vital Signs: ?ED Triage Vitals  ?Enc Vitals Group  ?   BP 01/30/22 2142 (!) 169/104  ?   Pulse Rate 01/30/22 2142 86  ?   Resp 01/30/22 2142 18  ?   Temp 01/30/22 2142 98.3 ?F (36.8 ?C)  ?   Temp Source 01/30/22 2142 Oral  ?   SpO2 01/30/22 2142 97 %  ?   Weight 01/30/22 2145 79.8 kg (176 lb)  ?   Height 01/30/22 2145 1.524 m (5')  ?   Head Circumference --   ?   Peak Flow --   ?   Pain Score 01/30/22 2144 7  ?   Pain Loc --   ?   Pain Edu? --   ?   Excl. in [place]? --   ? ? ?Most recent vital signs: ?Vitals:  ? 01/30/22 2142 01/30/22 2200  ?BP: (!) 169/104 (!) 142/85  ?Pulse: 86 69  ?Resp: 18 18  ?Temp: 98.3 ?F (36.8 ?C)   ?SpO2: 97% 97%  ? ? ? ?General: Awake, no distress.  ?CV:  Good peripheral perfusion.  Regular rate and rhythm ?Resp:  Normal effort.  CTA bilaterally ?Abd:  No distention.  ?Other:  No lower extremity swelling ? ? ?ED Results / Procedures / Treatments  ? ?Labs ?(all labs ordered are listed, but only abnormal results are displayed) ?Labs Reviewed - No data to display ? ? ?EKG ? ? ? ? ?RADIOLOGY ? ?PROCEDURES: ? ?Critical Care performed:  ? ?Procedures ? ? ?MEDICATIONS ORDERED IN ED: ?Medications  ?acetaminophen (TYLENOL) tablet 650 mg (650 mg Oral Given 01/30/22 2228)   ? ? ? ?IMPRESSION / MDM / ASSESSMENT AND PLAN / ED COURSE  ?I reviewed the triage vital signs and the nursing notes. ? ? ?Patient with a history of high blood pressure presents after an episode of elevated diastolic blood pressure and a headache. ? ?Her symptoms have resolved, she reports her headache is improved.  She has no neurodeficits.  No chest pain. ? ?Blood pressure has improved without intervention, she does report some difficulty with her blood pressure medications related to side effects, I have referred her to her PCP for adjustment of blood pressure medications as needed. ? ? ? ?  ? ? ?FINAL CLINICAL IMPRESSION(S) / ED DIAGNOSES  ? ?Final diagnoses:  ?Primary hypertension  ? ? ? ?Rx / DC Orders  ? ?ED Discharge Orders   ? ? None  ? ?  ? ? ? ?Note:  This document was prepared using Dragon voice recognition software and may include unintentional dictation errors. ?  ?Lavonia Drafts, MD ?01/30/22 2313 ? ?

## 2022-02-04 ENCOUNTER — Other Ambulatory Visit: Payer: Self-pay

## 2022-02-04 ENCOUNTER — Emergency Department: Payer: BC Managed Care – PPO

## 2022-02-04 ENCOUNTER — Telehealth: Payer: Self-pay | Admitting: Cardiovascular Disease

## 2022-02-04 ENCOUNTER — Emergency Department
Admission: EM | Admit: 2022-02-04 | Discharge: 2022-02-04 | Disposition: A | Discharge status: Home / Self Care | Payer: BC Managed Care – PPO | Attending: Emergency Medicine | Admitting: Emergency Medicine

## 2022-02-04 DIAGNOSIS — R079 Chest pain, unspecified: Secondary | ICD-10-CM

## 2022-02-04 DIAGNOSIS — E119 Type 2 diabetes mellitus without complications: Secondary | ICD-10-CM | POA: Diagnosis not present

## 2022-02-04 DIAGNOSIS — I10 Essential (primary) hypertension: Secondary | ICD-10-CM | POA: Insufficient documentation

## 2022-02-04 DIAGNOSIS — R202 Paresthesia of skin: Secondary | ICD-10-CM | POA: Diagnosis not present

## 2022-02-04 DIAGNOSIS — R519 Headache, unspecified: Secondary | ICD-10-CM

## 2022-02-04 LAB — BASIC METABOLIC PANEL
Anion gap: 8 (ref 5–15)
BUN: 15 mg/dL (ref 6–20)
CO2: 27 mmol/L (ref 22–32)
Calcium: 9.3 mg/dL (ref 8.9–10.3)
Chloride: 103 mmol/L (ref 98–111)
Creatinine, Ser: 0.73 mg/dL (ref 0.44–1.00)
GFR, Estimated: >60 mL/min (ref >60)
Glucose, Bld: 115 mg/dL — ABNORMAL HIGH (ref 70–99)
Potassium: 4.3 mmol/L (ref 3.5–5.1)
Sodium: 138 mmol/L (ref 135–145)

## 2022-02-04 LAB — CBC
HCT: 39.1 % (ref 36.0–46.0)
Hemoglobin: 13.2 g/dL (ref 12.0–15.0)
MCH: 29.6 pg (ref 26.0–34.0)
MCHC: 33.8 g/dL (ref 30.0–36.0)
MCV: 87.7 fL (ref 80.0–100.0)
Platelets: 232 10*3/uL (ref 150–400)
RBC: 4.46 MIL/uL (ref 3.87–5.11)
RDW: 12.3 % (ref 11.5–15.5)
WBC: 6 10*3/uL (ref 4.0–10.5)
nRBC: 0 % (ref 0.0–0.2)

## 2022-02-04 LAB — TROPONIN I (HIGH SENSITIVITY)
Troponin I (High Sensitivity): 5 ng/L (ref <18)
Troponin I (High Sensitivity): 5 ng/L (ref <18)

## 2022-02-04 NOTE — Telephone Encounter (Signed)
? ?  Pt c/o BP issue: STAT if pt c/o blurred vision, one-sided weakness or slurred speech ? ?1. What are your last 5 BP readings? 154/96 ? ?2. Are you having any other symptoms (ex. Dizziness, headache, blurred vision, passed out)? headache, blurred vision, passed out ? ?3. What is your BP issue? Pt is concern about her elevated BP she's been going to ED. She made an appt on 05/01 with Cadence and she wanted to know what she can do during the weekend regarding her BP ?

## 2022-02-04 NOTE — Telephone Encounter (Signed)
Called and spoke with patient.  ? ?Patient reports that she has been dealing with high blood pressure ever since she was seen by Dr. Rockey Situ last. Has been seen in [facility] ER 5 times and [facility] ER once since her visit with Dr. Rockey Situ on 3/27.  ? ?Has also been seen by her PCP multiple times, and her medications have been changed multiple times by her PCP.  ? ?Reports that she didn't completely pass out, it was earlier this week on Tuesday, EMS was called and she was evaluated in the ER.  ? ?Reports that she is very scared and worried that she is going to have a stroke or heart attack.  ? ?Advised patient that her BP from today while high, was not alarmingly high, however patient states that her BP has never been that high in the past. Advised patient that she wouldn't want her BP to drop too low too fast as that could make her feel like crap as well.  ? ?Advised patient that she should call her PCP as they have made several medication changes since she was seen by [place] on 3/27. Pt stated that they closed at noon today, but she would try to call them in the morning.  ? ?Advised patient to do her best not to worry about her blood pressure, as anxiety would make it worse, and if she felt that she needed to be evaluated over the weekend and her PCP could not, she should go to Urgent Care or the ER.  ? ?Patient verbalized understanding of everything discussed and voiced appreciation for the call.  ? ?Will route to primary cardiologist and PA seeing patient on 5/1 ?

## 2022-02-04 NOTE — ED Provider Notes (Signed)
? ?[facility] ?Provider Note ? ? ? Event Date/Time  ? First MD Initiated Contact with Patient 02/04/22 0259   ?  (approximate) ? ? ?History  ? ?Hypertension ? ? ?HPI ? ?Felicia HausenValentina I Barnett is a 56 y.o. female with a history of hypertension, hyperlipidemia, anxiety, prediabetes who presents for evaluation of hypertension.  Patient reports that 3 weeks ago she was taken off of her chlorthalidone due to hypokalemia and dehydration.  Since then she has been struggling to keep her blood pressure under control.  She reports that she usually feels palpitations with some tingling and pain in her left arm that can go up to her head and neck.  Usually when the symptoms developed she knows her blood pressure is elevated.  Today she reports that she had a good day but this evening started having the same symptoms.  She started feeling palpitations and started having pain in her left arm and a heaviness in the left side of the chest going up to her jaw/neck area.  She also had a left-sided headache.  Denies thunderclap headache, blurry vision, syncope.  She checked her blood pressure which was 189/101.  This is patient's sixth visit to the ER for similar symptoms.  She reports that she saw her primary care doctor yesterday who started her on amlodipine on top of her nebivolol.  She took her first dose of amlodipine yesterday.  At this time she feels better.  Reports that her chest pain and palpitations resolved.  No longer having headache. ?  ? ? ?Past Medical History:  ?Diagnosis Date  ? Anxiety   ? since surgery, pt has had attacks w/ fear of not being able to move her leg (anxiety attacks occur  3-4x month.)  ? Diabetes mellitus without complication (HCC)   ? pre-diabetes  ? H/O prior ablation treatment   ? Heart murmur   ? palpatiations  ? Hyperlipidemia   ? Hypertension   ? SVT (supraventricular tachycardia) (HCC)   ? ? ?Past Surgical History:  ?Procedure Laterality Date  ? ABDOMINAL HYSTERECTOMY    ?  BREAST BIOPSY Right   ? neg,excisional bx  ? cardial ablation    ? anxiety attack (heart palpitations HR=210 bpm) the day she was released from the hospital from surgery. Findings were neg .  ? COLONOSCOPY    ? ESOPHAGOGASTRODUODENOSCOPY (EGD) WITH PROPOFOL N/A 08/12/2020  ? Procedure: ESOPHAGOGASTRODUODENOSCOPY (EGD) WITH PROPOFOL;  Surgeon: Toney ReilVanga, Rohini Reddy, MD;  [facility];  Service: Gastroenterology;  Laterality: N/A;  ? ? ? ?Physical Exam  ? ?Triage Vital Signs: ?ED Triage Vitals [02/04/22 0048]  ?Enc Vitals Group  ?   BP (!) 185/107  ?   Pulse Rate 73  ?   Resp 16  ?   Temp 97.8 ?F (36.6 ?C)  ?   Temp Source Oral  ?   SpO2 98 %  ?   Weight 176 lb (79.8 kg)  ?   Height 5' (1.524 m)  ?   Head Circumference   ?   Peak Flow   ?   Pain Score 5  ?   Pain Loc   ?   Pain Edu?   ?   Excl. in GC?   ? ? ?Most recent vital signs: ?Vitals:  ? 02/04/22 0300 02/04/22 0400  ?BP: 138/78 (!) 144/76  ?Pulse: 78 69  ?Resp: 18 18  ?Temp:    ?SpO2: 99% 98%  ? ? ? ?Constitutional: Alert and oriented.  Well appearing and in no apparent distress. ?HEENT: ?     Head: Normocephalic and atraumatic.    ?     Eyes: Conjunctivae are normal. Sclera is non-icteric.  ?     Mouth/Throat: Mucous membranes are moist.  ?     Neck: Supple with no signs of meningismus. ?Cardiovascular: Regular rate and rhythm. No murmurs, gallops, or rubs. 2+ symmetrical distal pulses are present in all extremities.  ?Respiratory: Normal respiratory effort. Lungs are clear to auscultation bilaterally.  ?Gastrointestinal: Soft, non tender, and non distended with positive bowel sounds. No rebound or guarding. ?Genitourinary: No CVA tenderness. ?Musculoskeletal:  No edema, cyanosis, or erythema of extremities. ?Neurologic: Normal speech and language. Face is symmetric. Moving all extremities. No gross focal neurologic deficits are appreciated. ?Skin: Skin is warm, dry and intact. No rash noted. ?Psychiatric: Mood and affect are normal. Speech and  behavior are normal. ? ?ED Results / Procedures / Treatments  ? ?Labs ?(all labs ordered are listed, but only abnormal results are displayed) ?Labs Reviewed  ?BASIC METABOLIC PANEL - Abnormal; Notable for the following components:  ?    Result Value  ? Glucose, Bld 115 (*)   ? All other components within normal limits  ?CBC  ?TROPONIN I (HIGH SENSITIVITY)  ?TROPONIN I (HIGH SENSITIVITY)  ? ? ? ?EKG ? ?ED ECG REPORT ?I, Nita Sickle, the attending physician, personally viewed and interpreted this ECG. ? ?Sinus rhythm with a rate of 61, normal intervals, normal axis, no ST elevations or depressions.   ? ?RADIOLOGY ?I, Nita Sickle, attending MD, have personally viewed and interpreted the images obtained during this visit as below: ? ?Head CT is negative ? ? ?___________________________________________________ ?Interpretation by Radiologist:  ?CT Head Wo Contrast ? ?Result Date: 02/04/2022 ?CLINICAL DATA:  Hypertension EXAM: CT HEAD WITHOUT CONTRAST TECHNIQUE: Contiguous axial images were obtained from the base of the skull through the vertex without intravenous contrast. RADIATION DOSE REDUCTION: This exam was performed according to the departmental dose-optimization program which includes automated exposure control, adjustment of the mA and/or kV according to patient size and/or use of iterative reconstruction technique. COMPARISON:  07/05/2019. FINDINGS: Brain: No evidence of acute infarction, hemorrhage, cerebral edema, mass, mass effect, or midline shift. No hydrocephalus or extra-axial fluid collection. Vascular: No hyperdense vessel. Skull: Normal. Negative for fracture or focal lesion. Sinuses/Orbits: No acute finding. Other: The mastoid air cells are well aerated. IMPRESSION: IMPRESSION No acute intracranial process. Electronically Signed   By: Wiliam Ke M.D.   On: 02/04/2022 03:45   ? ? ? ? ?PROCEDURES: ? ?Critical Care performed: No ? ?Procedures ? ? ? ?IMPRESSION / MDM / ASSESSMENT AND PLAN /  ED COURSE  ?I reviewed the triage vital signs and the nursing notes. ? ?56 y.o. female with a history of hypertension, hyperlipidemia, anxiety, prediabetes who presents for evaluation of hypertension, palpitations, L sided CP and HA in the setting of elevated BP.  During my examination patient's blood pressure significantly improved, she looks well-appearing and in no distress.  She is neurologically intact.  This is her sixth visit to the ED in the last 3 weeks since being taken off of chlorthalidone for elevated blood pressure.  It was started on amlodipine yesterday on top of her nebivolol.  Patient is neurologically intact with a nonfocal exam ? ?Ddx: Uncontrolled hypertension, hypertensive urgency, hypertensive emergency, anxiety, ACS ? ? ?Plan: EKG, troponin x2, CT head, CBC and metabolic panel.  We will monitor patient on telemetry. ? ? ?MEDICATIONS  GIVEN IN ED: ?Medications - No data to display ? ? ?ED COURSE: EKG with no signs of ischemia.  2 high-sensitivity troponins were negative.  CT head was unremarkable.  Labs with no electrolyte derangements, no leukocytosis, no anemia.  Blood pressure significantly improved.  Recommended continue monitoring of blood pressure at home and close follow-up with her primary care doctor so they can continue to adjust her medications until her symptoms are well controlled.  With resolution of her symptoms, negative work-up, impression that is improved I feel the patient is safe for discharge home with no indication for admission at this time.  Discussed my standard return precautions. ? ? ?Consults: None ? ? ?EMR reviewed including last visit with her cardiologist from 3 weeks ago ? ? ? ?FINAL CLINICAL IMPRESSION(S) / ED DIAGNOSES  ? ?Final diagnoses:  ?Primary hypertension  ?Acute nonintractable headache, unspecified headache type  ?Chest pain, unspecified type  ? ? ? ?Rx / DC Orders  ? ?ED Discharge Orders   ? ? None  ? ?  ? ? ? ?Note:  This document was prepared using  Dragon voice recognition software and may include unintentional dictation errors. ? ? ?Please note:  Patient was evaluated in Emergency Department today for the symptoms described in the history of present ill

## 2022-02-04 NOTE — Discharge Instructions (Signed)
Your work-up was again unremarkable.  Make sure to follow-up with your primary care doctor so they can continue to adjust your blood pressure medication.  Return to the hospital for severe headache, facial droop, slurred speech, chest pain, dizziness, shortness of breath. ?

## 2022-02-04 NOTE — ED Triage Notes (Signed)
Pt presents to ER c/o hypertension.  Pt states BP was 189/101 at home yesterday.  Pt was seen at PCP and put on amlodipine on Wednesday and started it yesterday.  Pt c/o dizziness, HA, and palpitations.  Pt denies CP and sob at this time. Pt is A&O x4 at this time in NAD in triage.  ?

## 2022-02-04 NOTE — ED Notes (Signed)
E-signature pad unavailable - Pt verbalized understanding of D/C information - no additional concerns at this time.  

## 2022-02-06 NOTE — Progress Notes (Signed)
?Cardiology Office Note:   ? ?Date:  02/07/2022  ? ?ID:  Felicia Barnett, DOB Aug 13, 1966, MRN [id number] ? ?PCP:  Donnie Coffin, MD  ?[facility] Cardiologist:  Ida Rogue, MD  ?[facility] Electrophysiologist:  None  ? ?Referring MD: Donnie Coffin, MD  ? ?Chief Complaint: ER f/u for HTN ? ?History of Present Illness:   ? ?Felicia Barnett is a 56 y.o. female with a hx of HTN, diabetes, palpitations, HLD, former smoker, OSA on CPAP who presents for ER follow-up.  ? ?Cardiac CTA August 2021 with no coronary calcification.  ? ?Er visit 01/04/22 for dizziness and urinary frequency felt to be from chlorthalidone. Work-up was unremarkable. Losartan was recommended but she didn't want to start this. She restarted 1/2 chlorthalidone instead.  ? ?Last seen 01/07/22 and BP was well controlled. Meds were continued.  ? ?She has been seen in the ER 3 times since the last visit for HTN and HA. Work-up showed SBP 180-200s. CT head non-acute. Otherwise work-up was unremarkable. Felt anxiety was contributing. She was discharged home.  ? ?Today, the patient reports she is tired of seeing multiple doctors for the same thing. Has a lot of anxiety surrounding health. Says when her BP is high she has arm tingling, and tingling in her face. She is afraid to have a stroke. All the symptoms started when she started chlorthalidone but had low potassium, dehydration, urinary frequency. She was switched to losartan, she felt worse on this. She is off both chlorthalidone and Losartan. PCP started amlodipine. She is taking bystolic 2.5mg  daily (at noon and at night). Propranolol is only to use as needed, does not take it. Had [place] in January, also is afraid she has residual changes from this. Reports palpitations occur every day. This morning she woke with a bloody nose, BP 160/90. Last night it was 180/90s.She uses a CPAP. No recent fevers, Has chills every day, cold sweats, concern for hormonal changes. No LLE, orthopnea, No nausea  or vomiting. She has diarrhea with high BP. Has HA, lightheadedness, dizziness with high BP. Checks her BP multiple times a day 3-4 times a day. She has gone through menopause, but unsure if it is continuing.  ?She wants her thyroid checked. She takes amlodipine in the am and then at 12 she takes bystolic BID. She is working on her diet a lot, not eating meat. She started walking last week. She has been on metoprolol before and this was stopped due to low heart rate.  ? ?Past Medical History:  ?Diagnosis Date  ? Anxiety   ? since surgery, pt has had attacks w/ fear of not being able to move her leg (anxiety attacks occur  3-4x month.)  ? Diabetes mellitus without complication ([place])   ? pre-diabetes  ? H/O prior ablation treatment   ? Heart murmur   ? palpatiations  ? Hyperlipidemia   ? Hypertension   ? SVT (supraventricular tachycardia) ([place])   ? ? ?Past Surgical History:  ?Procedure Laterality Date  ? ABDOMINAL HYSTERECTOMY    ? BREAST BIOPSY Right   ? neg,excisional bx  ? cardial ablation    ? anxiety attack (heart palpitations HR=210 bpm) the day she was released from the hospital from surgery. Findings were neg .  ? COLONOSCOPY    ? ESOPHAGOGASTRODUODENOSCOPY (EGD) WITH PROPOFOL N/A 08/12/2020  ? Procedure: ESOPHAGOGASTRODUODENOSCOPY (EGD) WITH PROPOFOL;  Surgeon: Lin Landsman, MD;  Location: [facility] ENDOSCOPY;  Service: Gastroenterology;  Laterality: N/A;  ? ? ?  Current Medications: ?Current Meds  ?Medication Sig  ? Ascorbic Acid (VITAMIN C) 100 MG tablet Take 100 mg by mouth daily.  ? aspirin 81 MG chewable tablet Chew by mouth as needed.  ? cholecalciferol (VITAMIN D) 25 MCG (1000 UT) tablet Take 1,000 Units by mouth daily.  ? Cyanocobalamin (VITAMIN B 12 PO) Take 1 mg by mouth every other day.   ? famotidine (PEPCID) 20 MG tablet Take 20 mg by mouth 2 (two) times daily.  ? hydrALAZINE (APRESOLINE) 10 MG tablet Take 1 tablet (10 mg total) by mouth daily as needed. For BP > 160/100  ? nebivolol (BYSTOLIC)  2.5 MG tablet Take 2.5 mg by mouth 2 (two) times daily.  ? propranolol (INDERAL) 20 MG tablet Take 1 tablet (20 mg total) by mouth 3 (three) times daily as needed. For breakthrough palpitations.  ? valACYclovir (VALTREX) 500 MG tablet Take 500 mg by mouth 2 (two) times daily as needed (for flares).   ? [DISCONTINUED] amLODipine (NORVASC) 5 MG tablet Take 5 mg by mouth daily.  ?  ? ?Allergies:   Iodinated contrast media  ? ?Social History  ? ?Socioeconomic History  ? Marital status: Legally Separated  ?  Spouse name: Not on file  ? Number of children: Not on file  ? Years of education: Not on file  ? Highest education level: Not on file  ?Occupational History  ? Not on file  ?Tobacco Use  ? Smoking status: Former  ?  Years: 20.00  ?  Types: Cigarettes  ?  Quit date: 03/02/2009  ?  Years since quitting: 12.9  ? Smokeless tobacco: Never  ?Vaping Use  ? Vaping Use: Never used  ?Substance and Sexual Activity  ? Alcohol use: Yes  ?  Comment: occ  ? Drug use: No  ? Sexual activity: Not on file  ?Other Topics Concern  ? Not on file  ?Social History Narrative  ? Not on file  ? ?Social Determinants of Health  ? ?Financial Resource Strain: Not on file  ?Food Insecurity: Not on file  ?Transportation Needs: Not on file  ?Physical Activity: Not on file  ?Stress: Not on file  ?Social Connections: Not on file  ?  ? ?Family History: ?The patient's family history includes Diabetes in her father and mother; Heart attack in her father; Heart disease in her mother; Hypertension in her father and mother. There is no history of Breast cancer. ? ?ROS:   ?Please see the history of present illness.    ? All other systems reviewed and are negative. ? ?EKGs/Labs/Other Studies Reviewed:   ? ?The following studies were reviewed today: ? ?Cardiac CTA 2021 ?IMPRESSION: ?1. Small pulmonary nodules in the left lung, largest of which is in ?the periphery of the left upper lobe measuring only 5 mm. These are ?nonspecific, but statistically likely  benign. No follow-up needed if ?patient is low-risk (and has no known or suspected primary ?neoplasm). Non-contrast chest CT can be considered in 12 months if ?patient is high-risk. This recommendation follows the consensus ?statement: Guidelines for Management of Incidental Pulmonary Nodules ?Detected on CT Images: From the Fleischner Society 2017; Radiology ?[id number]. ?2. Hepatic steatosis. ?3. Small hiatal hernia. ?  ?  ?Electronically Signed ?  By: Vinnie Langton M.D. ?  On: 06/04/2020 16:33 ? ?Heart monitor 2021 ? Normal sinus rhythm ?avg HR of 70 bpm.  ?  ?4 Supraventricular Tachycardia/atrial tachycardia runs occurred, the run with the fastest interval lasting 4 beats with a  max rate of 164 bpm,  ?the longest lasting 7 beats with an avg rate of 127 bpm.  ?  ?Isolated SVEs were rare (<1.0%), SVE Couplets were rare (<1.0%), and SVE Triplets were rare (<1.0%). Isolated VEs were rare (<1.0%, 20),  ?VE Triplets were rare (<1.0%, 1), and no VE Couplets were present. ?  ?Select patient triggered events (2) associated with short runs of atrial tachycardia, most triggered events associated with normal sinus rhythm. ?  ?Signed, ?Esmond Plants, MD, Ph.D ?[facility] ? ?EKG:  EKG is  ordered today.  The ekg ordered today demonstrates SB 59bpm, nonspecific ST changes, overall stable ? ?Recent Labs: ?09/05/2021: ALT 20 ?12/29/2021: Magnesium 2.1 ?02/04/2022: BUN 15; Creatinine, Ser 0.73; Hemoglobin 13.2; Platelets 232; Potassium 4.3; Sodium 138  ?Recent Lipid Panel ?   ?Component Value Date/Time  ? CHOL 236 (H) 10/23/2019 1051  ? TRIG 282 (H) 10/23/2019 1051  ? HDL 41 10/23/2019 1051  ? CHOLHDL 5.8 (H) 10/23/2019 1051  ? LDLCALC 144 (H) 10/23/2019 1051  ? LDLDIRECT 158 (H) 10/23/2019 1051  ? ? ?Physical Exam:   ? ?VS:  BP 120/76 (BP Location: Left Arm, Patient Position: Sitting, Cuff Size: Large)   Pulse (!) 59   Ht 5' (1.524 m)   Wt 175 lb (79.4 kg)   SpO2 99%   BMI 34.18 kg/m?    ? ?Wt Readings from Last 3  Encounters:  ?02/07/22 175 lb (79.4 kg)  ?02/04/22 176 lb (79.8 kg)  ?01/30/22 176 lb (79.8 kg)  ?  ? ?GEN:  Well nourished, well developed in no acute distress ?HEENT: Normal ?NECK: No JVD; No carotid bruits ?L

## 2022-02-07 ENCOUNTER — Encounter: Payer: Self-pay | Admitting: Medical

## 2022-02-07 ENCOUNTER — Ambulatory Visit: Payer: BC Managed Care – PPO | Admitting: Medical

## 2022-02-07 ENCOUNTER — Other Ambulatory Visit
Admission: RE | Admit: 2022-02-07 | Discharge: 2022-02-07 | Disposition: A | Discharge status: Home / Self Care | Payer: BC Managed Care – PPO | Attending: Medical | Admitting: Medical

## 2022-02-07 ENCOUNTER — Ambulatory Visit (INDEPENDENT_AMBULATORY_CARE_PROVIDER_SITE_OTHER): Payer: BC Managed Care – PPO

## 2022-02-07 VITALS — BP 120/76 | HR 59 | Ht 60.0 in | Wt 175.0 lb

## 2022-02-07 DIAGNOSIS — R079 Chest pain, unspecified: Secondary | ICD-10-CM

## 2022-02-07 DIAGNOSIS — F418 Other specified anxiety disorders: Secondary | ICD-10-CM

## 2022-02-07 DIAGNOSIS — R001 Bradycardia, unspecified: Secondary | ICD-10-CM

## 2022-02-07 DIAGNOSIS — I10 Essential (primary) hypertension: Secondary | ICD-10-CM

## 2022-02-07 DIAGNOSIS — R002 Palpitations: Secondary | ICD-10-CM | POA: Diagnosis not present

## 2022-02-07 LAB — TSH: TSH: 1.481 u[IU]/mL (ref 0.350–4.500)

## 2022-02-07 MED ORDER — AMLODIPINE BESYLATE 10 MG PO TABS: 10.0000 mg | ORAL_TABLET | Freq: Every day | ORAL | 5 refills | Status: DC

## 2022-02-07 MED ORDER — HYDRALAZINE HCL 10 MG PO TABS: 10.0000 mg | ORAL_TABLET | Freq: Every day | ORAL | 0 refills | Status: DC | PRN

## 2022-02-07 NOTE — Patient Instructions (Signed)
Medication Instructions:  ?Your physician has recommended you make the following change in your medication:  ? ?INCREASE Amlodipine to 10 mg daily. ? ?START Hydralazine 10 mg daily as needed for blood pressure > 160/100 ? ?*If you need a refill on your cardiac medications before your next appointment, please call your pharmacy* ? ? ?Lab Work: ?Tsh today ? ?Please have your lab drawn at the [facility]. ?Stop at the Registration desk to check in ? ?If you have labs (blood work) drawn today and your tests are completely normal, you will receive your results only by: ?MyChart Message (if you have MyChart) OR ?A paper copy in the mail ?If you have any lab test that is abnormal or we need to change your treatment, we will call you to review the results. ? ? ?Testing/Procedures: ?Your physician has requested that you have an echocardiogram. Echocardiography is a painless test that uses sound waves to create images of your heart. It provides your doctor with information about the size and shape of your heart and how well your heart?s chambers and valves are working. This procedure takes approximately one hour. There are no restrictions for this procedure. ? ?Your provider has ordered a heart monitor to wear for 14 days. This will be mailed to your home with instructions on placement. Once you have finished the time frame requested, you will return monitor in box provided. ? ? ? ? ? ?Follow-Up: ?At [facility], you and your health needs are our priority.  As part of our continuing mission to provide you with exceptional heart care, we have created designated Provider Care Teams.  These Care Teams include your primary Cardiologist (physician) and Advanced Practice Providers (APPs -  Physician Assistants and Nurse Practitioners) who all work together to provide you with the care you need, when you need it. ? ?We recommend signing up for the patient portal called "MyChart".  Sign up information is provided on this  After Visit Summary.  MyChart is used to connect with patients for Virtual Visits (Telemedicine).  Patients are able to view lab/test results, encounter notes, upcoming appointments, etc.  Non-urgent messages can be sent to your provider as well.   ?To learn more about what you can do with MyChart, go to [email or web address].   ? ?Your next appointment:   ?2 month(s) ? ?The format for your next appointment:   ?In Person ? ?Provider:   ?You may see Julien Nordmann, MD or one of the following Advanced Practice Providers on your designated Care Team:   ?Nicolasa Ducking, NP ?Eula Listen, PA-C ?Cadence Fransico Michael, PA-C{ ? ? ? ?Other Instructions ?N/A ? ?Important Information About Sugar ? ? ? ? ? ? ?

## 2022-02-09 DIAGNOSIS — R002 Palpitations: Secondary | ICD-10-CM | POA: Diagnosis not present

## 2022-02-10 ENCOUNTER — Other Ambulatory Visit: Payer: Self-pay

## 2022-02-10 DIAGNOSIS — R42 Dizziness and giddiness: Secondary | ICD-10-CM | POA: Insufficient documentation

## 2022-02-10 DIAGNOSIS — E119 Type 2 diabetes mellitus without complications: Secondary | ICD-10-CM | POA: Insufficient documentation

## 2022-02-10 DIAGNOSIS — I10 Essential (primary) hypertension: Secondary | ICD-10-CM | POA: Diagnosis not present

## 2022-02-10 NOTE — ED Triage Notes (Signed)
Pt presents to ER c/o hypertension.  Pt states her BP at home was 224/115.  States she has been recently started on amlodipine and her BP has been normal since starting med. Tonight she states she was watching TV and noticed her BP was high again.  Pt states she has been feeling dizzy tonight, and currently does not feel very bad.  Pt is A&O x4 at this time in NAD in triage.   ?

## 2022-02-11 ENCOUNTER — Emergency Department: Payer: BC Managed Care – PPO

## 2022-02-11 ENCOUNTER — Emergency Department
Admission: EM | Admit: 2022-02-11 | Discharge: 2022-02-11 | Disposition: A | Discharge status: Home / Self Care | Payer: BC Managed Care – PPO | Attending: Emergency Medicine | Admitting: Emergency Medicine

## 2022-02-11 DIAGNOSIS — I10 Essential (primary) hypertension: Secondary | ICD-10-CM

## 2022-02-11 LAB — URINALYSIS, ROUTINE W REFLEX MICROSCOPIC
Bilirubin Urine: NEGATIVE
Glucose, UA: NEGATIVE mg/dL
Hgb urine dipstick: NEGATIVE
Ketones, ur: NEGATIVE mg/dL
Leukocytes,Ua: NEGATIVE
Nitrite: NEGATIVE
Protein, ur: NEGATIVE mg/dL
Specific Gravity, Urine: 1.008 (ref 1.005–1.030)
pH: 5 (ref 5.0–8.0)

## 2022-02-11 LAB — CBC
HCT: 39 % (ref 36.0–46.0)
Hemoglobin: 12.7 g/dL (ref 12.0–15.0)
MCH: 28.7 pg (ref 26.0–34.0)
MCHC: 32.6 g/dL (ref 30.0–36.0)
MCV: 88.2 fL (ref 80.0–100.0)
Platelets: 234 10*3/uL (ref 150–400)
RBC: 4.42 MIL/uL (ref 3.87–5.11)
RDW: 12.2 % (ref 11.5–15.5)
WBC: 6.1 10*3/uL (ref 4.0–10.5)
nRBC: 0 % (ref 0.0–0.2)

## 2022-02-11 LAB — BASIC METABOLIC PANEL
Anion gap: 7 (ref 5–15)
BUN: 15 mg/dL (ref 6–20)
CO2: 27 mmol/L (ref 22–32)
Calcium: 9.5 mg/dL (ref 8.9–10.3)
Chloride: 106 mmol/L (ref 98–111)
Creatinine, Ser: 0.58 mg/dL (ref 0.44–1.00)
GFR, Estimated: >60 mL/min (ref >60)
Glucose, Bld: 119 mg/dL — ABNORMAL HIGH (ref 70–99)
Potassium: 4.2 mmol/L (ref 3.5–5.1)
Sodium: 140 mmol/L (ref 135–145)

## 2022-02-11 LAB — TROPONIN I (HIGH SENSITIVITY)
Troponin I (High Sensitivity): 4 ng/L (ref <18)
Troponin I (High Sensitivity): 4 ng/L (ref <18)

## 2022-02-11 NOTE — ED Notes (Signed)
Pt ambulated to BR to try for a urine sample ?

## 2022-02-11 NOTE — ED Provider Notes (Signed)
? ?[facility] ?Provider Note ? ? ? Event Date/Time  ? First MD Initiated Contact with Patient 02/11/22 0203   ?  (approximate) ? ? ?History  ? ?Hypertension ? ? ?HPI ? ?Felicia Barnett is a 56 y.o. female past medical history of diabetes, anxiety, hypertension presents with concern for elevated blood pressure.  Patient notes that she was sitting down watching TV when she suddenly felt a ringing sensation in her ears and felt like her left arm was heavy/numb.  Checked her blood pressure and is in the 123456 systolic.  She also felt somewhat lightheaded.  She is compliant with her amlodipine.  Symptoms have gradually resolved she denies any chest pain dyspnea.  Says she has had similar before when her blood pressures been elevated.  She is currently seeing cardiology and is wearing a Zio patch.  She is scheduled for an echocardiogram as well. ?  ? ?Past Medical History:  ?Diagnosis Date  ? Anxiety   ? since surgery, pt has had attacks w/ fear of not being able to move her leg (anxiety attacks occur  3-4x month.)  ? Diabetes mellitus without complication ([place])   ? pre-diabetes  ? H/O prior ablation treatment   ? Heart murmur   ? palpatiations  ? Hyperlipidemia   ? Hypertension   ? SVT (supraventricular tachycardia) ([place])   ? ? ?Patient Active Problem List  ? Diagnosis Date Noted  ? History of Helicobacter pylori infection   ? Paroxysmal tachycardia ([place]) 12/24/2018  ? Chest pain 12/24/2018  ? Palpitations 12/24/2018  ? Essential hypertension 12/24/2018  ? Low potassium syndrome 12/24/2018  ? PVC's (premature ventricular contractions) 12/24/2018  ? Other fatigue 12/24/2018  ? Lower extremity neuropathy 08/07/2015  ? Paresthesia of left leg 07/14/2015  ? Anxiety 10/29/2013  ? Hyperlipidemia 10/29/2013  ? ? ? ?Physical Exam  ?Triage Vital Signs: ?ED Triage Vitals  ?Enc Vitals Group  ?   BP 02/10/22 2329 (!) 161/99  ?   Pulse Rate 02/10/22 2329 68  ?   Resp 02/10/22 2329 16  ?   Temp 02/10/22 2329  98.2 ?F (36.8 ?C)  ?   Temp Source 02/10/22 2329 Oral  ?   SpO2 02/10/22 2329 98 %  ?   Weight 02/10/22 2329 176 lb (79.8 kg)  ?   Height 02/10/22 2329 5' (1.524 m)  ?   Head Circumference --   ?   Peak Flow --   ?   Pain Score 02/11/22 0306 0  ?   Pain Loc --   ?   Pain Edu? --   ?   Excl. in [place]? --   ? ? ?Most recent vital signs: ?Vitals:  ? 02/11/22 0300 02/11/22 0333  ?BP: 140/86 117/67  ?Pulse: 64 63  ?Resp: 15 17  ?Temp:  98 ?F (36.7 ?C)  ?SpO2: 100% 100%  ? ? ? ?General: Awake, no distress.  ?CV:  Good peripheral perfusion.  ?Resp:  Normal effort.  ?Abd:  No distention.  ?Neuro:             Awake, Alert, Oriented x 3  ?Other:  Aox3, nml speech  ?PERRL, EOMI, face symmetric, nml tongue movement  ?5/5 strength in the BL upper and lower extremities  ?Sensation grossly intact in the BL upper and lower extremities  ?Finger-nose-finger intact BL ? ? ? ?ED Results / Procedures / Treatments  ?Labs ?(all labs ordered are listed, but only abnormal results are displayed) ?Labs Reviewed  ?  BASIC METABOLIC PANEL - Abnormal; Notable for the following components:  ?    Result Value  ? Glucose, Bld 119 (*)   ? All other components within normal limits  ?URINALYSIS, ROUTINE W REFLEX MICROSCOPIC - Abnormal; Notable for the following components:  ? Color, Urine STRAW (*)   ? APPearance CLEAR (*)   ? All other components within normal limits  ?CBC  ?TROPONIN I (HIGH SENSITIVITY)  ?TROPONIN I (HIGH SENSITIVITY)  ? ? ? ?EKG ? ?EKG interpretation performed by myself: NSR, nml axis, nml intervals, no acute ischemic changes ? ? ? ?RADIOLOGY ?I reviewed the CT scan of the brain which does not show any acute intracranial process; agree with radiology report  ? ? ? ?PROCEDURES: ? ?Critical Care performed: No ? ?Procedures ? ? ? ?MEDICATIONS ORDERED IN ED: ?Medications - No data to display ? ? ?IMPRESSION / MDM / ASSESSMENT AND PLAN / ED COURSE  ?I reviewed the triage vital signs and the nursing notes. ?             ?                ? ?Differential diagnosis includes, but is not limited to, asymptomatic hypertension, anxiety, less likely hypertensive emergency, CVA, intracranial hemorrhage ? ?Patient is a 56 year old female with history of hypertension who presents with elevated blood pressure at home and some nonspecific symptoms along with it.  She developed a whooshing sensation in her ear left arm numbness/heaviness and lightheadedness checked her blood pressure and it was in the 123456 systolic.  Has had similar symptoms with high blood pressure in the past.  Currently she is asymptomatic.  Blood pressure on repeat is relatively normal 1 teens over 60s.  Her neurologic exam is nonfocal.  CT head was ordered from triage and this is reassuring.  BMP and CBC are within normal limits troponin is negative.  Patient has had multiple recent ED visits for hypertension and related symptoms.  I suspect that patient's blood pressure was a result not the cause of her symptoms.  My suspicion for TIA is low.  I advised patient to continue her medication as prescribed and follow-up with cardiology as scheduled. ? ?  ? ? ?FINAL CLINICAL IMPRESSION(S) / ED DIAGNOSES  ? ?Final diagnoses:  ?Hypertension, unspecified type  ? ? ? ?Rx / DC Orders  ? ?ED Discharge Orders   ? ? None  ? ?  ? ? ? ?Note:  This document was prepared using Dragon voice recognition software and may include unintentional dictation errors. ?  ?Rada Hay, MD ?02/11/22 [phone] ? ?

## 2022-02-11 NOTE — ED Notes (Signed)
Pt has Zio monitor, wear for 14 days, pt is on her first week of wear time ?

## 2022-02-11 NOTE — Discharge Instructions (Signed)
Your work-up was all reassuring today.  Please continue to take your blood pressure medication as prescribed and follow-up with your cardiologist. ?

## 2022-02-11 NOTE — ED Notes (Addendum)
D/C instructions reviewed with pt, advised to keep ECHO appt later this month and to f/u with Dr. Mariah Milling in July as schedule. Advised to monitor BP regularly and follow a heart healthy diet with low sodium. Pt and husband verbalized understanding, all questions and concerns address.  ?

## 2022-02-17 ENCOUNTER — Telehealth: Payer: Self-pay | Admitting: Cardiovascular Disease

## 2022-02-17 ENCOUNTER — Encounter: Payer: Self-pay | Admitting: Cardiovascular Disease

## 2022-02-17 NOTE — Telephone Encounter (Signed)
Pt c/o medication issue: ? ?1. Name of Medication: Amlodipine ? ?2. How are you currently taking this medication (dosage and times per day)? 1 tablet daily ? ?3. Are you having a reaction (difficulty breathing--STAT)?  ? ?4. What is your medication issue?  See message on My Chart dated today 02-17-22 ? ?

## 2022-02-17 NOTE — Telephone Encounter (Signed)
Spoke with the patient. ?Pt has also sent 4 mychart messages today. ?Pt reports adverse reaction to Amlodipine. ?LE edema, diaphoresis, flushing face,  headache, dizziness, ?feeling like I'm going to pass out, high blood pressure, coughs , insomnia, depression.   ?Patient was seen in the ED yesterday for hypertension. She called EMS out to her home this morning due to the reported symptoms, she declined transport to the ED. ?Patient was started on Amlodipine by her pcp, it was increased to 10 mg daily by Terrilee Croak, PA on 02/07/22. ? ?Patient also called her pcps office today and was instructed to stop Amlodipine. Pt sts that her pcp also advised her not to take the prn hydralazine for BP >160/100. ?Pt is currently taking bystolic 2.5 mg bid at 12 pm and 3 pm. Pt sts that her BP is usually high in the evening. Adv her to try and space out her doses 6-8 hours to see if this helps with better BP control. ?Pt has made several visits to the ED for hypertension. ?Pt has tried and failed several anti-hypertensive medications. She is concerned that bystolic alone will not control her BP. ?She is currently wearing  a zio and is scheduled for an echo. ?Pt would like to be seen by Dr. Mariah Milling. ?Appt scheduled with Dr. Mariah Milling on 02/22/22 @ 11am.  ?Adv the patient that she can call in the interim if assistance is needed. ?Patient agreeable with the call and voiced appreciation for the assistance. ? ? ?

## 2022-02-21 NOTE — Progress Notes (Signed)
? ?Date:  02/22/2022  ? ?ID:  Felicia Barnett, DOB: December 20, 1965, MRN: [id number] ? ?PCP:  Donnie Coffin, MD  ? ?Chief Complaint  ?Patient presents with  ? Hypertension  ?  Patient c/o palpitations and chest pressure with tingling and pain in left arm. Medications reviewed by the patient verbally.   ? ? ?HPI:  ?Felicia Barnett is a 56 y.o. female with a history of: ?Hypertension ?Diabetes ?Palpitations ?Hyperlipidemia  ?Anxiety about health ?Smoker, quit 56 yo ?OSA on CPAP ?Covid Jan 2022 ?Cardiac CTA August 2021 no coronary disease ?Who presents to the office today for dizziness, concern about her blood pressure ? ?Last seen by myself in clinic January 07, 2022 ? ?Seen by one of our providers Feb 07, 2022 ?Reported at that time "when her BP is high she has arm tingling, and tingling in her face. She is afraid to have a stroke. All the symptoms started when she started chlorthalidone but had low potassium, dehydration, urinary frequency. She was switched to losartan, she felt worse on this. She is off both chlorthalidone and Losartan. PCP started amlodipine. She is taking bystolic 2.5mg  daily (at noon and at night). Propranolol is only to use as needed, does not take it." ?takes amlodipine in the am and then at 12 she takes bystolic BID. ?On her visit to the clinic amlodipine was increased up to 10 mg ?Also received hydralazine 10 mg to take as needed for systolic pressure over 0000000 ?Heart monitor was ordered, this is not back yet ? ?Seen in the emergency room Feb 11, 2022 for high blood pressure ?ringing sensation in her ears and felt like her left arm was heavy/numb, [id number] systolic ?Follow-up blood pressures in the emergency room well controlled ?No medication changes were made ? ?On today's visit, amlodipine is not on her medication list reports having side effect  foot swelling and others side effects such as facial swelling ?Reports one of the nurses recommended she try taking bystolic 2.5 mg three times a day, blood pressure  better on this regiment ?Sleeping good, feels calm ?Has not tried hydralazine as needed ?Started working out, does yoga ? ?Feels when her blood pressure goes up causes her left arm to get tingly, tightness left pectoral area ?Prior cardiac work-up including cardiac CTA negative for disease ? ?Side effect on  ?Losartan: facial flushing ?Chlorthalidone: "low potassium, dehydration" ?Amlodipine: leg swelling, facial swelling, depression" ? ?EKG personally reviewed by myself on todays visit ?Normal sinus rhythm rate 60 bpm no significant ST-T wave changes ? ?Other records reviewed ?In the ER 01/04/22 ?Reported having symptoms of dizziness ?chronic urinary frequency without dysuria that she attributes to her chlorthalidone. ?Feels the chlorthalidone was affecting her electrolytes ?BMP reviewed, normal numbers ? ?Continues to use CPAP for sleep apnea ? ?Cardiac CTA August 2021,  ?No coronary calcification, no coronary disease ? ?Seen in the emergency room January 31, 2021 for various issues, diarrhea, palpitations ? ?zio monitor  for significant arrhythmia, reviewed ?Rare short runs SVT/atrial tachycardia longest was 7 beatsSelect patient triggered events (2) associated with short runs of atrial tachycardia, most triggered events associated with normal sinus rhythm. ? ?CT abdomen 2019 reviewed with minimal aortic atherosclerosis ?Prior CT chest 2017 with minimal coronary calcification ? ?PMH:   has a past medical history of Anxiety, Diabetes mellitus without complication ([place]), H/O prior ablation treatment, Heart murmur, Hyperlipidemia, Hypertension, and SVT (supraventricular tachycardia) ([place]). ? ?PSH:    ?Past Surgical History:  ?Procedure Laterality Date  ? ABDOMINAL HYSTERECTOMY    ?  BREAST BIOPSY Right   ? neg,excisional bx  ? cardial ablation    ? anxiety attack (heart palpitations HR=210 bpm) the day she was released from the hospital from surgery. Findings were neg .  ? COLONOSCOPY    ? ESOPHAGOGASTRODUODENOSCOPY  (EGD) WITH PROPOFOL N/A 08/12/2020  ? Procedure: ESOPHAGOGASTRODUODENOSCOPY (EGD) WITH PROPOFOL;  Surgeon: Lin Landsman, MD;  Location: [facility] ENDOSCOPY;  Service: Gastroenterology;  Laterality: N/A;  ? ? ?Current Outpatient Medications  ?Medication Sig Dispense Refill  ? Ascorbic Acid (VITAMIN C) 100 MG tablet Take 100 mg by mouth daily.    ? aspirin 81 MG chewable tablet Chew by mouth as needed.    ? cholecalciferol (VITAMIN D) 25 MCG (1000 UT) tablet Take 1,000 Units by mouth daily.    ? Cyanocobalamin (VITAMIN B 12 PO) Take 1 mg by mouth every other day.     ? famotidine (PEPCID) 20 MG tablet Take 20 mg by mouth 2 (two) times daily.    ? nebivolol (BYSTOLIC) 2.5 MG tablet Take 2.5 mg by mouth in the morning, at noon, and at bedtime.    ? propranolol (INDERAL) 20 MG tablet Take 1 tablet (20 mg total) by mouth 3 (three) times daily as needed. For breakthrough palpitations. 90 tablet 3  ? valACYclovir (VALTREX) 500 MG tablet Take 500 mg by mouth 2 (two) times daily as needed (for flares).     ? ?No current facility-administered medications for this visit.  ? ? ?ALLERGIES:   Iodinated contrast media  ? ?SOCIAL HISTORY:  The patient  reports that she quit smoking about 12 years ago. Her smoking use included cigarettes. She has never used smokeless tobacco. She reports current alcohol use. She reports that she does not use drugs.  ? ?FAMILY HISTORY:   family history includes Diabetes in her father and mother; Heart attack in her father; Heart disease in her mother; Hypertension in her father and mother.  ? ? ?REVIEW OF SYSTEMS: ?Review of Systems  ?Constitutional: Negative.   ?HENT: Negative.    ?Eyes: Negative.   ?Respiratory: Negative.    ?Cardiovascular: Negative.  Negative for leg swelling.  ?Gastrointestinal: Negative.   ?Genitourinary: Negative.   ?Musculoskeletal: Negative.   ?Neurological: Negative.   ?Psychiatric/Behavioral: Negative.    ?All other systems reviewed and are negative. ? ?PHYSICAL  EXAM: ?VS:  BP 120/82 (BP Location: Left Arm, Patient Position: Sitting, Cuff Size: Normal)   Pulse 60   Ht 5' (1.524 m)   Wt 175 lb 2 oz (79.4 kg)   SpO2 98%   BMI 34.20 kg/m?  , BMI Body mass index is 34.2 kg/m?Marland Kitchen ?Constitutional:  oriented to person, place, and time. No distress.  ?HENT:  ?Head: Grossly normal ?Eyes:  no discharge. No scleral icterus.  ?Neck: No JVD, no carotid bruits  ?Cardiovascular: Regular rate and rhythm, no murmurs appreciated ?Pulmonary/Chest: Clear to auscultation bilaterally, no wheezes or rails ?Abdominal: Soft.  no distension.  no tenderness.  ?Musculoskeletal: Normal range of motion ?Neurological:  normal muscle tone. Coordination normal. No atrophy ?Skin: Skin warm and dry ?Psychiatric: normal affect, pleasant ? ?RECENT LABS: ?09/05/2021: ALT 20 ?12/29/2021: Magnesium 2.1 ?02/07/2022: TSH 1.481 ?02/10/2022: BUN 15; Creatinine, Ser 0.58; Hemoglobin 12.7; Platelets 234; Potassium 4.2; Sodium 140  ? ? ?LIPID PANEL: ?Lab Results  ?Component Value Date  ? CHOL 236 (H) 10/23/2019  ? HDL 41 10/23/2019  ? LDLCALC 144 (H) 10/23/2019  ? TRIG 282 (H) 10/23/2019  ? ?  ? ?WEIGHT: ?Wt Readings from Last  3 Encounters:  ?02/22/22 175 lb 2 oz (79.4 kg)  ?02/10/22 176 lb (79.8 kg)  ?02/07/22 175 lb (79.4 kg)  ?  ? ?ASSESSMENT AND PLAN: ? ?Essential hypertension ?Symptoms controlled on current regimen of beta-blocker ?On bystolic 2.5 mg 3 times a day,  ?Reports often blood pressure will spike in the evening  ?Recommended for high blood pressure that she take propranolol as needed  ?Other options include one-time dose of extra bystolic for spiking pressure ? ? ?Paroxysmal tachycardia ?Has a monitor in place ?Currently appears to be doing well on beta-blocker 3 times daily ? ?Insomnia ?History of sleep apnea on CPAP ?Yoga and exercise should help ? ?Atypical chest pain ?Cardiac CTA previously ordered, no coronary calcification, normal coronary arteries,  ?No further work-up, left arm tingling is atypical  in nature ? ?Anxiety concerning health ?Reassurance provided ?Recommend she try not to check her blood pressure as often ?Continue exercise program ?If symptoms persist will need follow-up with primary care for gener

## 2022-02-22 ENCOUNTER — Encounter: Payer: Self-pay | Admitting: Cardiovascular Disease

## 2022-02-22 ENCOUNTER — Ambulatory Visit (INDEPENDENT_AMBULATORY_CARE_PROVIDER_SITE_OTHER): Payer: BC Managed Care – PPO | Admitting: Cardiovascular Disease

## 2022-02-22 ENCOUNTER — Encounter: Payer: Self-pay | Admitting: Emergency Medicine

## 2022-02-22 VITALS — BP 120/82 | HR 60 | Ht 60.0 in | Wt 175.1 lb

## 2022-02-22 DIAGNOSIS — R002 Palpitations: Secondary | ICD-10-CM

## 2022-02-22 DIAGNOSIS — F419 Anxiety disorder, unspecified: Secondary | ICD-10-CM | POA: Diagnosis not present

## 2022-02-22 DIAGNOSIS — I10 Essential (primary) hypertension: Secondary | ICD-10-CM

## 2022-02-22 NOTE — Patient Instructions (Signed)
Medication Instructions:  ?Bystolic 2.5 three times a day ? ?If you need a refill on your cardiac medications before your next appointment, please call your pharmacy.  ? ?Lab work: ?No new labs needed ? ?Testing/Procedures: ?No new testing needed ? ?Follow-Up: ?At [facility], you and your health needs are our priority.  As part of our continuing mission to provide you with exceptional heart care, we have created designated Provider Care Teams.  These Care Teams include your primary Cardiologist (physician) and Advanced Practice Providers (APPs -  Physician Assistants and Nurse Practitioners) who all work together to provide you with the care you need, when you need it. ? ?You will need a follow up appointment in 12 months ? ?Providers on your designated Care Team:   ?Nicolasa Ducking, NP ?Eula Listen, PA-C ?Cadence Fransico Michael, PA-C ? ?COVID-19 Vaccine Information can be found at: [email or web address] For questions related to vaccine distribution or appointments, please email [email or web address] or call [phone].  ? ?

## 2022-03-01 ENCOUNTER — Ambulatory Visit (INDEPENDENT_AMBULATORY_CARE_PROVIDER_SITE_OTHER): Payer: BC Managed Care – PPO

## 2022-03-01 DIAGNOSIS — R001 Bradycardia, unspecified: Secondary | ICD-10-CM | POA: Diagnosis not present

## 2022-03-01 LAB — ECHOCARDIOGRAM COMPLETE
AR max vel: 2.5 cm2
AV Area VTI: 2.44 cm2
AV Area mean vel: 2.4 cm2
AV Mean grad: 3 mmHg
AV Peak grad: 5 mmHg
Ao pk vel: 1.12 m/s
Area-P 1/2: 5.02 cm2
Calc EF: 55.9 %
S' Lateral: 3 cm
Single Plane A2C EF: 55.5 %
Single Plane A4C EF: 57 %

## 2022-03-15 ENCOUNTER — Telehealth: Payer: Self-pay

## 2022-03-15 NOTE — Telephone Encounter (Signed)
-----   Message from [place], PA-C sent at 03/08/2022  9:39 PM EDT ----- Heart monitor showed predominately normal rhythm, occasional fast rhythms with some irregular rare beats.

## 2022-03-15 NOTE — Telephone Encounter (Signed)
Left a VM requesting a call back.

## 2022-03-18 NOTE — Telephone Encounter (Signed)
Pt is returning call.  

## 2022-03-21 NOTE — Telephone Encounter (Signed)
Results reviewed with patient and she verbalized understanding with no further questions at this time.  °

## 2022-04-11 ENCOUNTER — Ambulatory Visit: Payer: BC Managed Care – PPO | Admitting: Cardiovascular Disease

## 2022-04-13 ENCOUNTER — Ambulatory Visit: Payer: BC Managed Care – PPO | Admitting: Cardiovascular Disease

## 2022-06-14 ENCOUNTER — Encounter: Payer: Self-pay | Admitting: Gastroenterology

## 2022-06-14 ENCOUNTER — Ambulatory Visit (INDEPENDENT_AMBULATORY_CARE_PROVIDER_SITE_OTHER): Payer: BC Managed Care – PPO | Admitting: Gastroenterology

## 2022-06-14 ENCOUNTER — Other Ambulatory Visit: Payer: Self-pay

## 2022-06-14 VITALS — BP 152/92 | HR 64 | Temp 98.7°F | Ht 60.0 in | Wt 170.2 lb

## 2022-06-14 DIAGNOSIS — A048 Other specified bacterial intestinal infections: Secondary | ICD-10-CM

## 2022-06-14 DIAGNOSIS — R1013 Epigastric pain: Secondary | ICD-10-CM | POA: Diagnosis not present

## 2022-06-14 DIAGNOSIS — K449 Diaphragmatic hernia without obstruction or gangrene: Secondary | ICD-10-CM

## 2022-06-14 NOTE — Patient Instructions (Addendum)
Your esophageal xray is schedule for 06/21/2022 arrive to the medical [facility] 10[facility]am for 11:00am scan. If you need to reschedule call [phone] option 3 and option 2  You can go for your H pylori Breath test at [address] beside [employer] t[address] or [employer] on [address]

## 2022-06-14 NOTE — Progress Notes (Signed)
Arlyss Repress, MD [address]  [address]  [place], [place] [address]  Main: [phone]  Fax: [phone]    Gastroenterology Consultation  Referring Provider:     Emogene Morgan, MD Primary Care Physician:  Emogene Morgan, MD Primary Gastroenterologist:  Dr. Arlyss Repress Reason for Consultation:   Epigastric pain, abdominal bloating and dark stools       HPI:   INDICA MARCOTT is a 56 y.o. female referred by Dr. Emogene Morgan, MD  for consultation & management of chronic abdominal bloating.  Patient was diagnosed with H. pylori infection based on positive H. pylori stool antigen in 03/2020, s/p treatment for 2 weeks.  She has been experiencing severe abdominal bloating for several months which is not improved.  She had a history of constipation, which is partially relieved with eating flaxseed daily.  However, she does have to strain during bowel movement.  She is trying to follow healthy diet and incorporate some exercise, trying to lose weight.  She is taking Pepcid as needed for heartburn.  She is also found to have fatty liver as well as hiatal hernia which were incidentally found on the coronary calcium CT scan Patient is taking a lot of supplements including probiotics Patient denies smoking.  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  Her symptoms have modestly improved post treatment.  She reported worsening of acid reflux, started her on Protonix 40 mg twice daily which helps.  She has been experiencing severe constipation, has been gaining weight.  Continues to have abdominal bloating and reflux  Follow-up visit 03/23/2021 Patient continues to have ongoing abdominal bloating.  Her repeat H. pylori breath test came back positive and I prescribed Pylera.  She could not  afford Pylera and lost her insurance.  Currently she has new insurance and is requesting for H. pylori treatment with Pylera.  She does have ongoing constipation with intermittent loose stools.  Follow-up visit 06/23/2021 Patient has recurrent H. pylori infection, status posttreatment with Pylera.  She reports ongoing abdominal bloating.  Her constipation is under control on Linzess 142 MCG daily.  She is trying to follow a high-fiber diet but appears that she has been consuming fried foods, red meat regularly.  She is gaining weight.  She has immediate postprandial bloating.  She does not have any bloating if she does not eat.  Follow-up visit 06/13/2022 The patient returned from [place] about 3 to 4 weeks ago.  While she was in [place], she was ill, developed abdominal pain with diarrhea.  She states that she was treated with 3 days of antibiotics.  Her diarrhea resolved.  After she returned to [place] from [place], she continued to have upper abdominal discomfort associated with abdominal bloating and nausea.  She had history of recurrent H. pylori infection that was treated in the past.  She had seafood yesterday and noticed a dark stools as well as abdominal discomfort.  NSAIDs: None  Antiplts/Anticoagulants/Anti thrombotics: None  GI Procedures: Underwent colonoscopy at age 70, reportedly normal  Upper endoscopy 08/12/20 - Normal duodenal bulb and second portion of the duodenum. - Erythematous mucosa in the gastric fundus and gastric body. Biopsied. - Normal incisura and antrum. Biopsied. - Salmon-colored mucosa suspicious for short-segment Barrett's esophagus. Biopsied. - Small hiatal hernia  DIAGNOSIS:  A.  STOMACH, RANDOM; COLD BIOPSY:  - CHRONIC ACTIVE GASTRITIS WITH HELICOBACTER PYLORI TYPE ORGANISMS.  - NEGATIVE FOR DYSPLASIA AND MALIGNANCY.   B. GASTROESOPHAGEAL JUNCTION; COLD BIOPSY:  - SQUAMOUS MUCOSA WITH FEATURES OF REFLUX ESOPHAGITIS.  - NO INCREASE IN INTRAEPITHELIAL EOSINOPHILS  (LESS THAN 2 PER HPF).  - NEGATIVE FOR DYSPLASIA AND MALIGNANCY.    Past Medical History:  Diagnosis Date   Anxiety    since surgery, pt has had attacks w/ fear of not being able to move her leg (anxiety attacks occur  3-4x month.)   Diabetes mellitus without complication ([facility])    pre-diabetes   H/O prior ablation treatment    Heart murmur    palpatiations   Hyperlipidemia    Hypertension    SVT (supraventricular tachycardia) ([facility])     Past Surgical History:  Procedure Laterality Date   ABDOMINAL HYSTERECTOMY     BREAST BIOPSY Right    neg,excisional bx   cardial ablation     anxiety attack (heart palpitations HR=210 bpm) the day she was released from the hospital from surgery. Findings were neg .   COLONOSCOPY     ESOPHAGOGASTRODUODENOSCOPY (EGD) WITH PROPOFOL N/A 08/12/2020   Procedure: ESOPHAGOGASTRODUODENOSCOPY (EGD) WITH PROPOFOL;  Surgeon: Toney Reil, MD;  Location: [facility] ENDOSCOPY;  Service: Gastroenterology;  Laterality: N/A;    Current Outpatient Medications:    Ascorbic Acid (VITAMIN C) 100 MG tablet, Take 100 mg by mouth daily., Disp: , Rfl:    aspirin 81 MG chewable tablet, Chew by mouth as needed., Disp: , Rfl:    cholecalciferol (VITAMIN D) 25 MCG (1000 UT) tablet, Take 1,000 Units by mouth daily., Disp: , Rfl:    Cyanocobalamin (VITAMIN B 12 PO), Take 1 mg by mouth every other day. , Disp: , Rfl:    famotidine (PEPCID) 20 MG tablet, Take 20 mg by mouth 2 (two) times daily., Disp: , Rfl:    nebivolol (BYSTOLIC) 2.5 MG tablet, Take 2.5 mg by mouth in the morning, at noon, and at bedtime., Disp: , Rfl:    propranolol (INDERAL) 20 MG tablet, Take 1 tablet (20 mg total) by mouth 3 (three) times daily as needed. For breakthrough palpitations., Disp: 90 tablet, Rfl: 3   valACYclovir (VALTREX) 500 MG tablet, Take 500 mg by mouth 2 (two) times daily as needed (for flares). , Disp: , Rfl:    NIFEdipine (PROCARDIA-XL/NIFEDICAL-XL) 30 MG 24 hr tablet, Take 30 mg by  mouth daily., Disp: , Rfl:    Family History  Problem Relation Age of Onset   Heart disease Mother    Diabetes Mother    Hypertension Mother    Heart attack Father    Diabetes Father    Hypertension Father    Breast cancer Neg Hx      Social History   Tobacco Use   Smoking status: Former    Years: 20.00    Types: Cigarettes    Quit date: 03/02/2009    Years since quitting: 13.2   Smokeless tobacco: Never  Vaping Use   Vaping Use: Never used  Substance Use Topics   Alcohol use: Yes    Comment: occ   Drug use: No    Allergies as of 06/14/2022 - Review Complete 02/22/2022  Allergen Reaction Noted   Iodinated contrast media Hives 06/04/2020    Review of Systems:    All systems reviewed and negative except where noted in HPI.   Physical Exam:  BP (!) 152/92 (BP Location: Left Arm, Patient Position:  Sitting, Cuff Size: Normal)   Pulse 64   Temp 98.7 F (37.1 C) (Oral)   Ht 5' (1.524 m)   Wt 170 lb 4 oz (77.2 kg)   BMI 33.25 kg/m  No LMP recorded. Patient has had a hysterectomy.  General:   Alert,  Well-developed, well-nourished, pleasant and cooperative in NAD Head:  Normocephalic and atraumatic. Eyes:  Sclera clear, no icterus.   Conjunctiva pink. Ears:  Normal auditory acuity. Nose:  No deformity, discharge, or lesions. Mouth:  No deformity or lesions,oropharynx pink & moist. Neck:  Supple; no masses or thyromegaly. Lungs:  Respirations even and unlabored.  Clear throughout to auscultation.   No wheezes, crackles, or rhonchi. No acute distress. Heart:  Regular rate and rhythm; no murmurs, clicks, rubs, or gallops. Abdomen:  Normal bowel sounds. Soft, non-tender and moderately distended, tympanic to percussion without masses, hepatosplenomegaly or hernias noted.  No guarding or rebound tenderness.   Rectal: Not performed Msk:  Symmetrical without gross deformities. Good, equal movement & strength bilaterally. Pulses:  Normal pulses noted. Extremities:  No  clubbing or edema.  No cyanosis. Neurologic:  Alert and oriented x3;  grossly normal neurologically. Skin:  Intact without significant lesions or rashes. No jaundice. Psych:  Alert and cooperative. Normal mood and affect.  Imaging Studies: Reviewed  Assessment and Plan:   BENNETT VANSCYOC is a 56 y.o. Hispanic female from [place] with obesity, metabolic syndrome is seen in consultation for chronic abdominal bloating and constipation, history of H. pylori infection s/p treatment with triple therapy as well as Pylera, hiatal hernia and fatty liver  Abdominal discomfort with abdominal bloating and nausea Patient had history of recurrent H. pylori infections treated in the past Recent travel to [place] and recurrence of symptoms With history of dark stools, check CBC Recommend H. pylori breath test and treat if needed  Hiatal hernia Patient would like to know the size of hiatal hernia Ordered barium esophagogram  Follow up as needed   Arlyss Repress, MD

## 2022-06-15 LAB — CBC
Hematocrit: 36.3 % (ref 34.0–46.6)
Hemoglobin: 12.6 g/dL (ref 11.1–15.9)
MCH: 29.9 pg (ref 26.6–33.0)
MCHC: 34.7 g/dL (ref 31.5–35.7)
MCV: 86 fL (ref 79–97)
Platelets: 220 10*3/uL (ref 150–450)
RBC: 4.22 x10E6/uL (ref 3.77–5.28)
RDW: 12.9 % (ref 11.7–15.4)
WBC: 5.5 10*3/uL (ref 3.4–10.8)

## 2022-06-17 LAB — H. PYLORI BREATH TEST: H pylori Breath Test: NEGATIVE

## 2022-06-21 ENCOUNTER — Ambulatory Visit: Admission: RE | Admit: 2022-06-21 | Payer: BC Managed Care – PPO | Source: Ambulatory Visit

## 2022-06-30 ENCOUNTER — Other Ambulatory Visit: Payer: BC Managed Care – PPO

## 2022-07-20 ENCOUNTER — Ambulatory Visit
Admission: RE | Admit: 2022-07-20 | Discharge: 2022-07-20 | Disposition: A | Discharge status: Home / Self Care | Payer: BC Managed Care – PPO | Source: Ambulatory Visit | Attending: Gastroenterology | Admitting: Gastroenterology

## 2022-07-20 DIAGNOSIS — K449 Diaphragmatic hernia without obstruction or gangrene: Secondary | ICD-10-CM | POA: Insufficient documentation

## 2022-07-25 ENCOUNTER — Encounter: Payer: Self-pay | Admitting: Gastroenterology

## 2022-08-03 ENCOUNTER — Telehealth: Payer: Self-pay | Admitting: Gastroenterology

## 2022-08-03 NOTE — Telephone Encounter (Signed)
I do not see where we have referred patient to any Surgery place.tried to call patient but and patient mailbox is full so unable to leave a message

## 2022-08-03 NOTE — Telephone Encounter (Signed)
Called patient and patient states she figured it out and states it is dealing with her leg and we did not place referral. She also asked about the results of her hernia. Informed patient Dr. Marius Ditch released it to her mychart account and read her the results. She verbalized understanding of results

## 2022-08-03 NOTE — Telephone Encounter (Signed)
Pt call very upset she has been getting calls about having surgery and she want to know why she needs surgery and why GI didn't call her about the surgery  [phone]

## 2022-08-08 ENCOUNTER — Other Ambulatory Visit (INDEPENDENT_AMBULATORY_CARE_PROVIDER_SITE_OTHER): Payer: Self-pay | Admitting: Family Medicine

## 2022-08-08 DIAGNOSIS — M79605 Pain in left leg: Secondary | ICD-10-CM

## 2022-08-12 ENCOUNTER — Ambulatory Visit (INDEPENDENT_AMBULATORY_CARE_PROVIDER_SITE_OTHER): Payer: BC Managed Care – PPO

## 2022-08-12 DIAGNOSIS — M79605 Pain in left leg: Secondary | ICD-10-CM

## 2022-09-26 ENCOUNTER — Other Ambulatory Visit: Payer: Self-pay

## 2022-09-26 ENCOUNTER — Emergency Department: Payer: BC Managed Care – PPO

## 2022-09-26 ENCOUNTER — Emergency Department
Admission: EM | Admit: 2022-09-26 | Discharge: 2022-09-26 | Disposition: A | Discharge status: Home / Self Care | Payer: BC Managed Care – PPO | Attending: Emergency Medicine | Admitting: Emergency Medicine

## 2022-09-26 DIAGNOSIS — Z1152 Encounter for screening for COVID-19: Secondary | ICD-10-CM | POA: Insufficient documentation

## 2022-09-26 DIAGNOSIS — R509 Fever, unspecified: Secondary | ICD-10-CM | POA: Diagnosis not present

## 2022-09-26 DIAGNOSIS — J111 Influenza due to unidentified influenza virus with other respiratory manifestations: Secondary | ICD-10-CM

## 2022-09-26 DIAGNOSIS — J3489 Other specified disorders of nose and nasal sinuses: Secondary | ICD-10-CM | POA: Insufficient documentation

## 2022-09-26 DIAGNOSIS — R059 Cough, unspecified: Secondary | ICD-10-CM | POA: Diagnosis not present

## 2022-09-26 DIAGNOSIS — D72829 Elevated white blood cell count, unspecified: Secondary | ICD-10-CM | POA: Insufficient documentation

## 2022-09-26 DIAGNOSIS — R0981 Nasal congestion: Secondary | ICD-10-CM | POA: Insufficient documentation

## 2022-09-26 DIAGNOSIS — M791 Myalgia, unspecified site: Secondary | ICD-10-CM | POA: Diagnosis not present

## 2022-09-26 LAB — COMPREHENSIVE METABOLIC PANEL
ALT: 19 U/L (ref 0–44)
AST: 17 U/L (ref 15–41)
Albumin: 4 g/dL (ref 3.5–5.0)
Alkaline Phosphatase: 55 U/L (ref 38–126)
Anion gap: 6 (ref 5–15)
BUN: 14 mg/dL (ref 6–20)
CO2: 27 mmol/L (ref 22–32)
Calcium: 9 mg/dL (ref 8.9–10.3)
Chloride: 105 mmol/L (ref 98–111)
Creatinine, Ser: 0.63 mg/dL (ref 0.44–1.00)
GFR, Estimated: >60 mL/min (ref >60)
Glucose, Bld: 111 mg/dL — ABNORMAL HIGH (ref 70–99)
Potassium: 3.8 mmol/L (ref 3.5–5.1)
Sodium: 138 mmol/L (ref 135–145)
Total Bilirubin: 0.5 mg/dL (ref 0.3–1.2)
Total Protein: 7.1 g/dL (ref 6.5–8.1)

## 2022-09-26 LAB — CBC WITH DIFFERENTIAL/PLATELET
Abs Immature Granulocytes: 0.06 10*3/uL (ref 0.00–0.07)
Basophils Absolute: 0 10*3/uL (ref 0.0–0.1)
Basophils Relative: 0 %
Eosinophils Absolute: 0.1 10*3/uL (ref 0.0–0.5)
Eosinophils Relative: 1 %
HCT: 37.8 % (ref 36.0–46.0)
Hemoglobin: 12.7 g/dL (ref 12.0–15.0)
Immature Granulocytes: 1 %
Lymphocytes Relative: 19 %
Lymphs Abs: 2.1 10*3/uL (ref 0.7–4.0)
MCH: 30 pg (ref 26.0–34.0)
MCHC: 33.6 g/dL (ref 30.0–36.0)
MCV: 89.4 fL (ref 80.0–100.0)
Monocytes Absolute: 0.8 10*3/uL (ref 0.1–1.0)
Monocytes Relative: 7 %
Neutro Abs: 8 10*3/uL — ABNORMAL HIGH (ref 1.7–7.7)
Neutrophils Relative %: 72 %
Platelets: 209 10*3/uL (ref 150–400)
RBC: 4.23 MIL/uL (ref 3.87–5.11)
RDW: 12.5 % (ref 11.5–15.5)
WBC: 11.1 10*3/uL — ABNORMAL HIGH (ref 4.0–10.5)
nRBC: 0 % (ref 0.0–0.2)

## 2022-09-26 LAB — RESP PANEL BY RT-PCR (RSV, FLU A&B, COVID)  RVPGX2
Influenza A by PCR: NEGATIVE
Influenza B by PCR: NEGATIVE
Resp Syncytial Virus by PCR: NEGATIVE
SARS Coronavirus 2 by RT PCR: NEGATIVE

## 2022-09-26 LAB — TROPONIN I (HIGH SENSITIVITY): Troponin I (High Sensitivity): 7 ng/L (ref <18)

## 2022-09-26 MED ORDER — FLUTICASONE PROPIONATE 50 MCG/ACT NA SUSP: 1.0000 | Freq: Every day | NASAL | 2 refills | Status: DC

## 2022-09-26 MED ORDER — BENZONATATE 100 MG PO CAPS: 100.0000 mg | ORAL_CAPSULE | Freq: Three times a day (TID) | ORAL | 0 refills | Status: AC | PRN

## 2022-09-26 MED ORDER — CETIRIZINE HCL 10 MG PO TABS: 10.0000 mg | ORAL_TABLET | Freq: Every day | ORAL | 1 refills | Status: DC

## 2022-09-26 MED ORDER — ONDANSETRON HCL 4 MG PO TABS: 4.0000 mg | ORAL_TABLET | Freq: Every day | ORAL | 0 refills | Status: AC | PRN

## 2022-09-26 NOTE — ED Triage Notes (Signed)
Patient reports mid upper back pain without injury, left chest pain, sore throat, nasal congestion, headache and elevated blood pressure. AOX4. Resp even, unlabored on RA. Ambulatory with steady gait.

## 2022-09-26 NOTE — ED Provider Triage Note (Signed)
Emergency Medicine Provider Triage Evaluation Note  Felicia Barnett , a 56 y.o. female  was evaluated in triage.  Pt complains of Upper back pain with mild L chest pain. Light cough, sore throat  Review of Systems  Positive: L upper back and L chest pain Negative: Fever, abd pain  Physical Exam  BP (!) 172/104 (BP Location: Left Arm)   Pulse 91   Temp 98.9 F (37.2 C) (Oral)   Resp 16   SpO2 98%  Gen:   Awake, no distress   Resp:  Normal effort  MSK:   Moves extremities without difficulty  Other:    Medical Decision Making  Medically screening exam initiated at 8:41 PM.  Appropriate orders placed.  DENALY GATLING was informed that the remainder of the evaluation will be completed by another provider, this initial triage assessment does not replace that evaluation, and the importance of remaining in the ED until their evaluation is complete.  Labs, EKG, chest xray, swab   Racheal Patches, PA-C 09/26/22 2043

## 2022-10-09 NOTE — ED Provider Notes (Signed)
[facility] Provider Note  Patient Contact: 3:27 PM (approximate)   History   Back Pain   HPI  Felicia Barnett is a 56 y.o. female presents to the emergency department with rhinorrhea, nasal congestion, nonproductive cough and bodyaches, particularly some upper back pain.  Patient denies exertional shortness of breath or lower extremity swelling.  She denies prior history of DVT or PE.  She has had some low-grade fever and chills at home.  No chest tightness.      Physical Exam   Triage Vital Signs: ED Triage Vitals  Enc Vitals Group     BP 09/26/22 2022 (!) 172/104     Pulse Rate 09/26/22 2022 91     Resp 09/26/22 2022 16     Temp 09/26/22 2022 98.9 F (37.2 C)     Temp Source 09/26/22 2022 Oral     SpO2 09/26/22 2022 98 %     Weight 09/26/22 2043 172 lb (78 kg)     Height 09/26/22 2043 5\' 1"  (1.549 m)     Head Circumference --      Peak Flow --      Pain Score 09/26/22 2043 8     Pain Loc --      Pain Edu? --      Excl. in GC? --     Most recent vital signs: Vitals:   09/26/22 2022  BP: (!) 172/104  Pulse: 91  Resp: 16  Temp: 98.9 F (37.2 C)  SpO2: 98%    Constitutional: Alert and oriented. Patient is lying supine. Eyes: Conjunctivae are normal. PERRL. EOMI. Head: Atraumatic. ENT:      Ears: Tympanic membranes are mildly injected with mild effusion bilaterally.       Nose: No congestion/rhinnorhea.      Mouth/Throat: Mucous membranes are moist. Posterior pharynx is mildly erythematous.  Hematological/Lymphatic/Immunilogical: No cervical lymphadenopathy.  Cardiovascular: Normal rate, regular rhythm. Normal S1 and S2.  Good peripheral circulation. Respiratory: Normal respiratory effort without tachypnea or retractions. Lungs CTAB. Good air entry to the bases with no decreased or absent breath sounds. Gastrointestinal: Bowel sounds 4 quadrants. Soft and nontender to palpation. No guarding or rigidity. No palpable masses. No  distention. No CVA tenderness. Musculoskeletal: Full range of motion to all extremities. No gross deformities appreciated. Neurologic:  Normal speech and language. No gross focal neurologic deficits are appreciated.  Skin:  Skin is warm, dry and intact. No rash noted. Psychiatric: Mood and affect are normal. Speech and behavior are normal. Patient exhibits appropriate insight and judgement.     ED Results / Procedures / Treatments   Labs (all labs ordered are listed, but only abnormal results are displayed) Labs Reviewed  COMPREHENSIVE METABOLIC PANEL - Abnormal; Notable for the following components:      Result Value   Glucose, Bld 111 (*)    All other components within normal limits  CBC WITH DIFFERENTIAL/PLATELET - Abnormal; Notable for the following components:   WBC 11.1 (*)    Neutro Abs 8.0 (*)    All other components within normal limits  RESP PANEL BY RT-PCR (RSV, FLU A&B, COVID)  RVPGX2  TROPONIN I (HIGH SENSITIVITY)     EKG  Normal sinus rhythm without ST segment elevation or other apparent arrhythmia.   RADIOLOGY  I personally viewed and evaluated these images as part of my medical decision making, as well as reviewing the written report by the radiologist.  ED Provider Interpretation: No acute abnormality.  PROCEDURES:  Critical Care performed: No  Procedures   MEDICATIONS ORDERED IN ED: Medications - No data to display   IMPRESSION / MDM / ASSESSMENT AND PLAN / ED COURSE  I reviewed the triage vital signs and the nursing notes.                              Assessment and plan Influenza-like illness 56 year old female presents to the emergency department with flulike symptoms for 1 day.  Patient tested negative for COVID-19 and influenza.  Troponin within range.  Patient had mild elevation in her white blood cell count but CBC was otherwise unremarkable.  CMP within reference range.  Chest x-ray shows no signs of pneumonia.  EKG indicates  normal sinus rhythm without ST segment elevation or other apparent arrhythmia.  Supportive medications were prescribed for home use as I suspect unspecified viral infection at this time.  Rest and hydration were encouraged at home.  All patient questions were answered.      FINAL CLINICAL IMPRESSION(S) / ED DIAGNOSES   Final diagnoses:  Influenza-like illness     Rx / DC Orders   ED Discharge Orders          Ordered    benzonatate (TESSALON PERLES) 100 MG capsule  3 times daily PRN        09/26/22 2200    cetirizine (ZYRTEC ALLERGY) 10 MG tablet  Daily        09/26/22 2200    ondansetron (ZOFRAN) 4 MG tablet  Daily PRN        09/26/22 2200    fluticasone (FLONASE) 50 MCG/ACT nasal spray  Daily        09/26/22 2200             Note:  This document was prepared using Dragon voice recognition software and may include unintentional dictation errors.   Pia [place], PA-C 10/09/22 1530    Jene Every, MD 10/09/22 1550

## 2022-11-17 ENCOUNTER — Other Ambulatory Visit: Payer: Self-pay | Admitting: Family Medicine

## 2022-11-17 DIAGNOSIS — Z1231 Encounter for screening mammogram for malignant neoplasm of breast: Secondary | ICD-10-CM

## 2022-12-05 ENCOUNTER — Ambulatory Visit
Admission: RE | Admit: 2022-12-05 | Discharge: 2022-12-05 | Disposition: A | Discharge status: Home / Self Care | Payer: BC Managed Care – PPO | Source: Ambulatory Visit | Attending: Family Medicine | Admitting: Family Medicine

## 2022-12-05 DIAGNOSIS — Z1231 Encounter for screening mammogram for malignant neoplasm of breast: Secondary | ICD-10-CM

## 2023-03-03 ENCOUNTER — Encounter: Payer: Self-pay | Admitting: Emergency Medicine

## 2023-03-03 ENCOUNTER — Other Ambulatory Visit: Payer: Self-pay

## 2023-03-03 ENCOUNTER — Emergency Department
Admission: EM | Admit: 2023-03-03 | Discharge: 2023-03-03 | Disposition: A | Discharge status: Home / Self Care | Payer: BC Managed Care – PPO | Attending: Emergency Medicine | Admitting: Emergency Medicine

## 2023-03-03 DIAGNOSIS — R002 Palpitations: Secondary | ICD-10-CM | POA: Insufficient documentation

## 2023-03-03 LAB — BASIC METABOLIC PANEL
Anion gap: 11 (ref 5–15)
BUN: 14 mg/dL (ref 6–20)
CO2: 23 mmol/L (ref 22–32)
Calcium: 9 mg/dL (ref 8.9–10.3)
Chloride: 102 mmol/L (ref 98–111)
Creatinine, Ser: 0.7 mg/dL (ref 0.44–1.00)
GFR, Estimated: >60 mL/min (ref >60)
Glucose, Bld: 107 mg/dL — ABNORMAL HIGH (ref 70–99)
Potassium: 3.7 mmol/L (ref 3.5–5.1)
Sodium: 136 mmol/L (ref 135–145)

## 2023-03-03 LAB — CBC
HCT: 37.9 % (ref 36.0–46.0)
Hemoglobin: 13 g/dL (ref 12.0–15.0)
MCH: 30 pg (ref 26.0–34.0)
MCHC: 34.3 g/dL (ref 30.0–36.0)
MCV: 87.5 fL (ref 80.0–100.0)
Platelets: 227 10*3/uL (ref 150–400)
RBC: 4.33 MIL/uL (ref 3.87–5.11)
RDW: 12.3 % (ref 11.5–15.5)
WBC: 5.3 10*3/uL (ref 4.0–10.5)
nRBC: 0 % (ref 0.0–0.2)

## 2023-03-03 LAB — TROPONIN I (HIGH SENSITIVITY): Troponin I (High Sensitivity): 4 ng/L (ref <18)

## 2023-03-03 LAB — MAGNESIUM: Magnesium: 2 mg/dL (ref 1.7–2.4)

## 2023-03-03 LAB — TSH: TSH: 3.717 u[IU]/mL (ref 0.350–4.500)

## 2023-03-03 NOTE — Discharge Instructions (Addendum)
You were seen in the emergency department for heart palpitations and feeling like your heart was racing.  You had a normal heart enzyme, do not believe you are having a heart attack.  Your electrolytes were normal including your potassium and magnesium.  Your thyroid studies were normal.  Continue to take your Bystolic as prescribed.  Call your cardiologist Dr. Mariah Milling in the morning to discuss outpatient cardiac monitoring.  Return to the emergency department for any worsening symptoms.

## 2023-03-03 NOTE — ED Triage Notes (Addendum)
Patient ambulatory to triage with steady gait, without difficulty or distress noted; pt reports for several wks having palpitations, HTN, occipital HA; denies CP

## 2023-03-03 NOTE — ED Provider Notes (Signed)
[facility] Provider Note    Event Date/Time   First MD Initiated Contact with Patient 03/03/23 [phone]     (approximate)   History   Palpitations   HPI  Felicia Barnett is a 57 y.o. female past medical history significant for prior atrial tachycardia, who presents to the emergency department with heart palpitations.  Endorses 1 week of progressively worsening heart palpitations.  States that she feels like her heart is racing and skipping a beat.  Followed by cardiology with Dr. Mariah Milling for heart palpitations.  Holter monitor worn last year.  History of SVT and prior ablation treatment.  Recent increase appetite and weight gain.  OSA but has been compliant with her sleep apnea.  Does not drink any caffeine, use nicotine or alcohol.  No marijuana use.     Physical Exam   Triage Vital Signs: ED Triage Vitals  Enc Vitals Group     BP 03/03/23 0020 (!) 173/97     Pulse Rate 03/03/23 0020 65     Resp 03/03/23 0020 20     Temp 03/03/23 0020 98.1 F (36.7 C)     Temp Source 03/03/23 0020 Oral     SpO2 03/03/23 0020 97 %     Weight 03/03/23 0019 176 lb (79.8 kg)     Height 03/03/23 0019 5' (1.524 m)     Head Circumference --      Peak Flow --      Pain Score 03/03/23 0019 8     Pain Loc --      Pain Edu? --      Excl. in GC? --     Most recent vital signs: Vitals:   03/03/23 0059 03/03/23 0130  BP:  126/75  Pulse: (!) 56 (!) 59  Resp: 14 13  Temp:    SpO2: 97% 95%    Physical Exam Constitutional:      Appearance: She is well-developed.  HENT:     Head: Atraumatic.  Eyes:     Conjunctiva/sclera: Conjunctivae normal.  Cardiovascular:     Rate and Rhythm: Regular rhythm.  Pulmonary:     Effort: No respiratory distress.  Abdominal:     General: There is no distension.  Musculoskeletal:        General: Normal range of motion.     Cervical back: Normal range of motion.  Skin:    General: Skin is warm.  Neurological:     Mental Status:  She is alert. Mental status is at baseline.     IMPRESSION / MDM / ASSESSMENT AND PLAN / ED COURSE  I reviewed the triage vital signs and the nursing notes.  On chart review patient had a history of Holter monitor with short runs of SVT and atrial tachycardia, patient is managed on Bystolic.  Differential diagnosis including electrolyte abnormality, atrial tachycardia, dysrhythmia, hypothyroidism  EKG  I, Corena Herter, the attending physician, personally viewed and interpreted this ECG.   Rate: Normal  Rhythm: Normal sinus  Axis: Normal  Intervals: Normal  ST&T Change: None  No tachycardic or bradycardic dysrhythmias while on cardiac telemetry.   LABS (all labs ordered are listed, but only abnormal results are displayed) Labs interpreted as -    Labs Reviewed  BASIC METABOLIC PANEL - Abnormal; Notable for the following components:      Result Value   Glucose, Bld 107 (*)    All other components within normal limits  CBC  MAGNESIUM  TSH  TROPONIN  I (HIGH SENSITIVITY)  TROPONIN I (HIGH SENSITIVITY)     MDM  No significant electrolyte abnormalities.  No signs of dehydration.  No hyperglycemia.  Potassium and magnesium within normal limits.  Thyroid studies within normal limits.  No tachycardic or bradycardic dysrhythmias while on cardiac telemetry in the emergency department.  Low risk heart score, troponin negative, low suspicion for ACS.  Discussed close follow-up with her cardiologist for possible Holter monitor as an outpatient.  Given return precautions for any worsening symptoms.     PROCEDURES:  Critical Care performed: No  Procedures  Patient's presentation is most consistent with acute presentation with potential threat to life or bodily function.   MEDICATIONS ORDERED IN ED: Medications - No data to display  FINAL CLINICAL IMPRESSION(S) / ED DIAGNOSES   Final diagnoses:  Palpitations     Rx / DC Orders   ED Discharge Orders           Ordered    Ambulatory referral to Cardiology       Comments: If you have not heard from the Cardiology office within the next 72 hours please call [phone].   03/03/23 0210             Note:  This document was prepared using Dragon voice recognition software and may include unintentional dictation errors.   Corena Herter, MD 03/03/23 [phone]

## 2023-03-07 NOTE — Progress Notes (Deleted)
Cardiology Office Note:    Date:  03/07/2023   ID:  Felicia Barnett, DOB 08-Jul-1966, MRN [id number]  PCP:  Emogene Morgan, MD   [facility] Providers Cardiologist:  Julien Nordmann, MD { Click to update primary MD,subspecialty MD or APP then REFRESH:1}    Referring MD: Emogene Morgan, MD   Chief Complaint: ***  History of Present Illness:    Felicia Barnett is a *** 57 y.o. female with a hx of SVT s/p ablation, hypertension, palpitations, hyperlipidemia, former smoker, OSA on CPAP, anxiety about health concerns, atypical chest pain, and mild MR.   Cardiac CTA August 2021 with coronary calcium score of 0, no evidence of CAD.  Seen by PA on 02/07/2022 at which time she reported when BP is high she has arm tingling and tingling in her face and is concerned about having a stroke.  Symptoms started when she started chlorthalidone but had low potassium, dehydration, and urinary frequency.  She was switched to losartan but felt worse on this.  Was then off chlorthalidone and losartan.  PCP started amlodipine.  She is taking Bystolic 2.5 daily at noon and at night and propranolol only as needed which she rarely takes.  At office visit 5/1 amlodipine was increased to 10 mg daily and hydralazine 10 mg added for systolic BP > 160 mmHg.   Seen in ED 02/11/22 for elevated BP, ringing in her ears, left arm heavy/numb. No medication changes were made.   At [facility] 02/22/22 with Dr. Mariah Milling, she had stopped amlodipine due to foot swelling and facial swelling.  She had started Bystolic 2.5 mg 3 times daily.  She started doing yoga for exercise and was feeling well.  Intolerances: Losartan - facial flushing, Chlorthalidone - low potassium, dehydration; amlodipine - leg and facial swelling, depression.  Cardiac monitor 03/07/2022 revealed underlying sinus rhythm with average HR 72 bpm, 1 run of NSVT lasting 4 beats, 11 runs of SVT, longest lasting 10 beats, rare ectopic beats, patient triggered her events  associated with normal sinus rhythm, rare PAC, rare PVC. Echo 03/01/22 revealed normal LVEF, no rwma, mild MR.   Seen in [facility] ED on 03/03/2023 with palpitations.  Reported 1 week of progressively worsening heart palpitations feeling like her heart is racing and skipping a beat.  She denied use of caffeine, nicotine, alcohol, or illicit substances.  EKG revealed normal sinus rhythm at 65 bpm.  She reported recent increase in appetite and weight gain.  Has not been compliant with CPAP. No arrhythmias noted on telemetry. Advised to follow-up as OP.    Past Medical History:  Diagnosis Date   Anxiety    since surgery, pt has had attacks w/ fear of not being able to move her leg (anxiety attacks occur  3-4x month.)   Diabetes mellitus without complication (HCC)    pre-diabetes   H/O prior ablation treatment    Heart murmur    palpatiations   Hyperlipidemia    Hypertension    SVT (supraventricular tachycardia)     Past Surgical History:  Procedure Laterality Date   ABDOMINAL HYSTERECTOMY     BREAST BIOPSY Right    neg,excisional bx   cardial ablation     anxiety attack (heart palpitations HR=210 bpm) the day she was released from the hospital from surgery. Findings were neg .   COLONOSCOPY     ESOPHAGOGASTRODUODENOSCOPY (EGD) WITH PROPOFOL N/A 08/12/2020   Procedure: ESOPHAGOGASTRODUODENOSCOPY (EGD) WITH PROPOFOL;  Surgeon: Toney Reil, MD;  Location: [facility] ENDOSCOPY;  Service: Gastroenterology;  Laterality: N/A;    Current Medications: No outpatient medications have been marked as taking for the 03/08/23 encounter (Appointment) with Lissa Hoard, Zachary George, NP.     Allergies:   Iodinated contrast media   Social History   Socioeconomic History   Marital status: Legally Separated    Spouse name: Not on file   Number of children: Not on file   Years of education: Not on file   Highest education level: Not on file  Occupational History   Not on file  Tobacco Use   Smoking  status: Former    Years: 20    Types: Cigarettes    Quit date: 03/02/2009    Years since quitting: 14.0   Smokeless tobacco: Never  Vaping Use   Vaping Use: Never used  Substance and Sexual Activity   Alcohol use: Yes    Comment: occ   Drug use: No   Sexual activity: Not on file  Other Topics Concern   Not on file  Social History Narrative   Not on file   Social Determinants of Health   Financial Resource Strain: Not on file  Food Insecurity: Not on file  Transportation Needs: Not on file  Physical Activity: Not on file  Stress: Not on file  Social Connections: Not on file     Family History: The patient's ***family history includes Diabetes in her father and mother; Heart attack in her father; Heart disease in her mother; Hypertension in her father and mother. There is no history of Breast cancer.  ROS:   Please see the history of present illness.    *** All other systems reviewed and are negative.  Labs/Other Studies Reviewed:    The following studies were reviewed today:  Cardiac Monitor 03/07/22 Normal sinus rhythm Patient had a min HR of 48 bpm, max HR of 193 bpm, and avg HR of 72 bpm.    1 run of Ventricular Tachycardia occurred lasting 4 beats with a max rate of 156 bpm (avg 142 bpm).    11 Supraventricular Tachycardia runs occurred, the run with the fastest interval lasting 9 beats with a max rate of 193 bpm, the longest lasting 10 beats with an avg rate of 157 bpm. Idioventricular Rhythm was present. .   Isolated SVEs were rare (<1.0%), SVE Couplets were rare (<1.0%), and SVE Triplets were rare (<1.0%).   Isolated VEs were rare (<1.0%), VE Couplets were rare (<1.0%), and no VE Triplets were present.    Patient triggered events associated with normal sinus rhythm, rare PAC, rare PVC  Echo 03/01/22 1. Left ventricular ejection fraction, by estimation, is 60 to 65%. The  left ventricle has normal function. The left ventricle has no regional  wall motion  abnormalities. Left ventricular diastolic parameters were  normal.   2. Right ventricular systolic function is normal. The right ventricular  size is normal. There is normal pulmonary artery systolic pressure. The  estimated right ventricular systolic pressure is 35.7 mmHg.   3. The mitral valve is normal in structure. Mild mitral valve  regurgitation. No evidence of mitral stenosis.   4. The aortic valve was not well visualized. Aortic valve regurgitation  is not visualized. No aortic stenosis is present.   5. The inferior vena cava is normal in size with greater than 50%  respiratory variability, suggesting right atrial pressure of 3 mmHg.  Coronary CTA 06/04/20 IMPRESSION: 1. Coronary calcium score of 0. Patient is low risk for  near term coronary events   2. Normal coronary origin with right dominance.   3. No evidence of CAD.  Recent Labs: 09/26/2022: ALT 19 03/03/2023: BUN 14; Creatinine, Ser 0.70; Hemoglobin 13.0; Magnesium 2.0; Platelets 227; Potassium 3.7; Sodium 136; TSH 3.717  Recent Lipid Panel    Component Value Date/Time   CHOL 236 (H) 10/23/2019 1051   TRIG 282 (H) 10/23/2019 1051   HDL 41 10/23/2019 1051   CHOLHDL 5.8 (H) 10/23/2019 1051   LDLCALC 144 (H) 10/23/2019 1051   LDLDIRECT 158 (H) 10/23/2019 1051     Risk Assessment/Calculations:   {Does this patient have ATRIAL FIBRILLATION?:[phone]}       Physical Exam:    VS:  There were no vitals taken for this visit.    Wt Readings from Last 3 Encounters:  03/03/23 176 lb (79.8 kg)  09/26/22 172 lb (78 kg)  06/14/22 170 lb 4 oz (77.2 kg)     GEN: *** Well nourished, well developed in no acute distress HEENT: Normal NECK: No JVD; No carotid bruits CARDIAC: ***RRR, no murmurs, rubs, gallops RESPIRATORY:  Clear to auscultation without rales, wheezing or rhonchi  ABDOMEN: Soft, non-tender, non-distended MUSCULOSKELETAL:  No edema; No deformity. *** pedal pulses, ***bilaterally SKIN: Warm and  dry NEUROLOGIC:  Alert and oriented x 3 PSYCHIATRIC:  Normal affect   EKG:  EKG is *** ordered today.  The ekg ordered today demonstrates ***  No BP recorded.  {Refresh Note OR Click here to enter BP  :1}***    Diagnoses:    No diagnosis found. Assessment and Plan:     Hypertension: SVT: History of ablation   {Are you ordering a CV Procedure (e.g. stress test, cath, DCCV, TEE, etc)?   Press F2        :[id number]}   Disposition:  Medication Adjustments/Labs and Tests Ordered: Current medicines are reviewed at length with the patient today.  Concerns regarding medicines are outlined above.  No orders of the defined types were placed in this encounter.  No orders of the defined types were placed in this encounter.   There are no Patient Instructions on file for this visit.   Signed, Levi Aland, NP  03/07/2023 1:25 PM    [facility]

## 2023-03-08 ENCOUNTER — Ambulatory Visit: Payer: BC Managed Care – PPO | Attending: Nurse Practitioner | Admitting: Nurse Practitioner

## 2023-03-08 ENCOUNTER — Encounter: Payer: Self-pay | Admitting: Nurse Practitioner

## 2023-04-26 ENCOUNTER — Institutional Professional Consult (permissible substitution): Payer: BC Managed Care – PPO | Admitting: Student in an Organized Health Care Education/Training Program

## 2023-04-28 ENCOUNTER — Encounter: Payer: Self-pay | Admitting: Student in an Organized Health Care Education/Training Program

## 2023-05-17 ENCOUNTER — Telehealth: Payer: Self-pay | Admitting: Cardiovascular Disease

## 2023-05-17 NOTE — Telephone Encounter (Signed)
Spoke with patient. Patient states that she used preparation H. Patient says that about 40 minutes after using Preparation H her palpitations increased. She said her heart rate was 100 and her blood pressure was 171/107. Patient states that she took her Bystolic. Patient reports that she is feeling better and her heart palpations have improved. Patient states that she is not home and unable to check her blood pressure but that she will check it when she gets home. Patient states that she will follow up if her symptoms return.

## 2023-05-17 NOTE — Telephone Encounter (Signed)
Pt c/o BP issue: STAT if pt c/o blurred vision, one-sided weakness or slurred speech  1. What are your last 5 BP readings?  171/107   2. Are you having any other symptoms (ex. Dizziness, headache, blurred vision, passed out)? Red eyes  3. What is your BP issue? Patient states that she took Preparation-H and started having issues with BP and not feeling right.

## 2023-05-18 ENCOUNTER — Telehealth: Payer: Self-pay | Admitting: Gastroenterology

## 2023-05-18 MED ORDER — HYDROCORTISONE (PERIANAL) 2.5 % EX CREA: 1.0000 | TOPICAL_CREAM | Freq: Two times a day (BID) | CUTANEOUS | 0 refills | Status: DC

## 2023-05-18 NOTE — Telephone Encounter (Signed)
Patient states this on Tuesday she started feeling like her a hemorrhoid was out of her rectum. She denies any constipation. She states last week she had a little rectal itching but denies any constipation. Patient states yesterday she began to have blood in stool and bright red bleeding when she wipes. She states that she has a sharp burning rectal pain that is constant. She states the pain is so bad. She states she tried preparation H but it raised her blood pressure and her cardiologist told her to stop taking it and to call us.

## 2023-05-18 NOTE — Addendum Note (Signed)
Addended by: Radene Knee L on: 05/18/2023 01:18 PM   Modules accepted: Orders

## 2023-05-18 NOTE — Telephone Encounter (Signed)
Patient called in she stated that she needs to make an urgent appointment with Dr. Allegra Lai. The patient stated that she has a hernia she in a lot of pain and she is bleeding.

## 2023-05-18 NOTE — Telephone Encounter (Signed)
Recommend to try sitz bath's daily Anusol cream per rectum twice daily for 7 to 10 days I can see her next Tuesday afternoon  RV

## 2023-05-18 NOTE — Telephone Encounter (Signed)
Patient verbalized understanding of instructions and sent medication to [facility] pharmacy per patient request

## 2023-05-21 ENCOUNTER — Emergency Department
Admission: EM | Admit: 2023-05-21 | Discharge: 2023-05-21 | Disposition: A | Discharge status: Home / Self Care | Payer: BC Managed Care – PPO | Attending: Emergency Medicine | Admitting: Emergency Medicine

## 2023-05-21 DIAGNOSIS — F419 Anxiety disorder, unspecified: Secondary | ICD-10-CM | POA: Insufficient documentation

## 2023-05-21 DIAGNOSIS — R002 Palpitations: Secondary | ICD-10-CM

## 2023-05-21 DIAGNOSIS — I10 Essential (primary) hypertension: Secondary | ICD-10-CM | POA: Insufficient documentation

## 2023-05-21 DIAGNOSIS — E119 Type 2 diabetes mellitus without complications: Secondary | ICD-10-CM | POA: Insufficient documentation

## 2023-05-21 LAB — BASIC METABOLIC PANEL
Anion gap: 7 (ref 5–15)
BUN: 14 mg/dL (ref 6–20)
CO2: 24 mmol/L (ref 22–32)
Calcium: 8.7 mg/dL — ABNORMAL LOW (ref 8.9–10.3)
Chloride: 107 mmol/L (ref 98–111)
Creatinine, Ser: 0.75 mg/dL (ref 0.44–1.00)
GFR, Estimated: >60 mL/min (ref >60)
Glucose, Bld: 114 mg/dL — ABNORMAL HIGH (ref 70–99)
Potassium: 4.1 mmol/L (ref 3.5–5.1)
Sodium: 138 mmol/L (ref 135–145)

## 2023-05-21 LAB — TROPONIN I (HIGH SENSITIVITY)
Troponin I (High Sensitivity): 6 ng/L (ref <18)
Troponin I (High Sensitivity): 5 ng/L (ref <18)

## 2023-05-21 LAB — CBC WITH DIFFERENTIAL/PLATELET
Abs Immature Granulocytes: 0.03 10*3/uL (ref 0.00–0.07)
Basophils Absolute: 0 10*3/uL (ref 0.0–0.1)
Basophils Relative: 0 %
Eosinophils Absolute: 0.2 10*3/uL (ref 0.0–0.5)
Eosinophils Relative: 4 %
HCT: 36.5 % (ref 36.0–46.0)
Hemoglobin: 12.3 g/dL (ref 12.0–15.0)
Immature Granulocytes: 1 %
Lymphocytes Relative: 47 %
Lymphs Abs: 2.4 10*3/uL (ref 0.7–4.0)
MCH: 29.9 pg (ref 26.0–34.0)
MCHC: 33.7 g/dL (ref 30.0–36.0)
MCV: 88.6 fL (ref 80.0–100.0)
Monocytes Absolute: 0.4 10*3/uL (ref 0.1–1.0)
Monocytes Relative: 8 %
Neutro Abs: 2.1 10*3/uL (ref 1.7–7.7)
Neutrophils Relative %: 40 %
Platelets: 207 10*3/uL (ref 150–400)
RBC: 4.12 MIL/uL (ref 3.87–5.11)
RDW: 12.4 % (ref 11.5–15.5)
WBC: 5.2 10*3/uL (ref 4.0–10.5)
nRBC: 0 % (ref 0.0–0.2)

## 2023-05-21 MED ORDER — HYDROXYZINE HCL 25 MG PO TABS: 25.0000 mg | ORAL_TABLET | Freq: Three times a day (TID) | ORAL | 0 refills | Status: DC | PRN

## 2023-05-21 NOTE — ED Provider Notes (Signed)
[facility] Provider Note    Event Date/Time   First MD Initiated Contact with Patient 05/21/23 [phone]     (approximate)  History   Chief Complaint: Palpitations and Hypertension  HPI  Felicia Barnett is a 57 y.o. female with a past medical history of anxiety, diabetes, hypertension, SVT status post ablation presents to the emergency department for palpitations and high blood pressure.  According to the patient overnight she was using her CPAP when around 3:00 this morning she awoke feeling short of breath and states she began having palpitations.  Patient states she went and checked her blood pressure and it was 180/110 so the patient came immediately to the emergency department.  States she was having some mild chest discomfort as well.  No nausea no diaphoresis.  Denies any history of heart disease besides SVT and ablation.  Has an appointment with her cardiologist 05/29/2023.  Currently patient appears well patient states she has been able to calm down and feels much better blood pressure currently 151/82 without intervention.  Physical Exam   Triage Vital Signs: ED Triage Vitals  Encounter Vitals Group     BP 05/21/23 0409 (!) 161/98     Systolic BP Percentile --      Diastolic BP Percentile --      Pulse Rate 05/21/23 0409 66     Resp 05/21/23 0409 20     Temp 05/21/23 0409 98 F (36.7 C)     Temp Source 05/21/23 0409 Oral     SpO2 05/21/23 0409 97 %     Weight 05/21/23 0410 176 lb (79.8 kg)     Height --      Head Circumference --      Peak Flow --      Pain Score --      Pain Loc --      Pain Education --      Exclude from Growth Chart --     Most recent vital signs: Vitals:   05/21/23 0740 05/21/23 0800  BP: (!) 161/87 (!) 151/82  Pulse: 68 (!) 57  Resp: 18 14  Temp:    SpO2: 99% 99%    General: Awake, no distress.  CV:  Good peripheral perfusion.  Regular rate and rhythm  Resp:  Normal effort.  Equal breath sounds bilaterally.   Abd:  No distention.  Soft, nontender.  No rebound or guarding. Other:  No lower extremity edema   ED Results / Procedures / Treatments   EKG  EKG viewed and interpreted by myself shows a normal sinus rhythm at 69 bpm with a narrow QRS, left axis deviation, largely normal intervals with nonspecific ST changes.  MEDICATIONS ORDERED IN ED: Medications - No data to display   IMPRESSION / MDM / ASSESSMENT AND PLAN / ED COURSE  I reviewed the triage vital signs and the nursing notes.  Patient's presentation is most consistent with acute presentation with potential threat to life or bodily function.  Patient presents to the emergency department for palpitations and some chest discomfort earlier today.  Patient states her main concern as she checked her blood pressure is 180/110.  Patient's workup in the emergency department is reassuring including a normal CBC normal chemistry and a negative troponin reassuring EKG.  Patient states she has been able to calm down and feels much better blood pressure currently 151/82.  As the patient came in just after symptoms occurred we will obtain a repeat troponin as a precaution.  I had a long discussion with the patient regarding her symptoms I highly suspect that anxiety is playing a role in her intermittent hypertension.  She states normally her blood pressure is in the normal range but when she has symptoms such as this the blood pressure is high and she often times blames the symptoms on the high blood pressure and I believe most likely it is vice versa and the symptoms are causing her to have high blood pressure.  Patient states she checks her blood pressure usually 2 or 3 times every day.  I discussed with the patient to try to check her blood pressure once every other week.  Patient is agreeable to this plan.  I also discussed a trial of hydroxyzine to be used only if needed for anxiety symptoms to see if this helps.  Patient is following up with her  cardiologist in 1 week on the 19th.  I discussed with the patient she may benefit from a repeat Holter monitor.  She states that her doctor is already done this once with no findings and plans to do it again.  Repeat opponent remains negative.  Patient will be discharged with outpatient cardiology follow-up.  Patient agreeable to plan of care.  FINAL CLINICAL IMPRESSION(S) / ED DIAGNOSES   Palpitations Anxiety  Rx / DC Orders   Hydroxyzine  Note:  This document was prepared using Dragon voice recognition software and may include unintentional dictation errors.   Minna Antis, MD 05/21/23 [phone]

## 2023-05-21 NOTE — ED Triage Notes (Signed)
Patient C/O palpitations that began at 0300 this morning. Patient also states that she checked her blood pressure as well, and that her diastolic was 180. Patient does have a history of hypertension and takes medication for such.

## 2023-05-28 NOTE — Progress Notes (Unsigned)
Date:  05/29/2023   ID:  Felicia Barnett, DOB: [birth year], MRN: [id number]  PCP:  Emogene Morgan, MD   Chief Complaint  Patient presents with   Follow-up    ED 8/11 dx palpitations, anxiety. Meds reviewed.     HPI:  Felicia Barnett is a 57 y.o. female with a history of: Hypertension Diabetes Palpitations Hyperlipidemia  Anxiety about health Smoker, quit 57 yo OSA on CPAP Covid Jan 2022 Cardiac CTA August 2021 no coronary disease Who presents to the office today for dizziness, concern about her blood pressure  Last seen by myself in clinic May 2023  Seen in the emergency room May 21, 2023 Woke at 3 AM feeling short of breath with palpitations Blood pressure 180/110 Blood pressure trended down in the ER ER physician concerned about anxiety playing a role in intermittent hypertension Potential trial of hydroxyzine  No f/u with CPAP since 2018  In follow-up today reports she is anxious about her mother's health Sedentary, eating more, sweet tooth, weight trending upward Concerned about the settings on her CPAP Sometimes towards the evening blood pressure will trend higher Episodic palpitations sometimes associated with higher blood pressure, not sure if this is from stress/anxiety Only on bystolic, not on nifedipine though this is still on her list  Atypical pain in left shoulder left chest when laying on her left side  Cholesterol trending higher, 260 up from 230  EKG personally reviewed by myself on todays visit EKG Interpretation Date/Time:  Monday May 29 2023 10:08:01 EDT Ventricular Rate:  62 PR Interval:  150 QRS Duration:  80 QT Interval:  412 QTC Calculation: 418 R Axis:   -10  Text Interpretation: Normal sinus rhythm Normal ECG When compared with ECG of 21-May-2023 04:16, No significant change was found Confirmed by Julien Nordmann [phone]) on 05/29/2023 10:08:45 AM     Side effect on  Losartan: facial flushing Chlorthalidone: "low potassium,  dehydration" Amlodipine: leg swelling, facial swelling, depression"  In the ER 01/04/22 Reported having symptoms of dizziness chronic urinary frequency without dysuria that she attributes to her chlorthalidone. Feels the chlorthalidone was affecting her electrolytes BMP reviewed, normal numbers  Continues to use CPAP for sleep apnea  Cardiac CTA August 2021,  No coronary calcification, no coronary disease  Seen in the emergency room January 31, 2021 for various issues, diarrhea, palpitations  zio monitor  for significant arrhythmia, reviewed Rare short runs SVT/atrial tachycardia longest was 7 beatsSelect patient triggered events (2) associated with short runs of atrial tachycardia, most triggered events associated with normal sinus rhythm.  CT abdomen 2019 reviewed with minimal aortic atherosclerosis Prior CT chest 2017 with minimal coronary calcification  PMH:   has a past medical history of Anxiety, Diabetes mellitus without complication (HCC), H/O prior ablation treatment, Heart murmur, Hyperlipidemia, Hypertension, and SVT (supraventricular tachycardia).  PSH:    Past Surgical History:  Procedure Laterality Date   ABDOMINAL HYSTERECTOMY     BREAST BIOPSY Right    neg,excisional bx   cardial ablation     anxiety attack (heart palpitations HR=210 bpm) the day she was released from the hospital from surgery. Findings were neg .   COLONOSCOPY     ESOPHAGOGASTRODUODENOSCOPY (EGD) WITH PROPOFOL N/A 08/12/2020   Procedure: ESOPHAGOGASTRODUODENOSCOPY (EGD) WITH PROPOFOL;  Surgeon: Toney Reil, MD;  Location: [facility] ENDOSCOPY;  Service: Gastroenterology;  Laterality: N/A;    Current Outpatient Medications  Medication Sig Dispense Refill   Ascorbic Acid (VITAMIN C) 100 MG tablet Take 100 mg  by mouth daily.     aspirin 81 MG chewable tablet Chew by mouth as needed.     cholecalciferol (VITAMIN D) 25 MCG (1000 UT) tablet Take 1,000 Units by mouth daily.     clobetasol cream  (TEMOVATE) 0.05 % Apply topically 2 (two) times daily.     Cyanocobalamin (VITAMIN B 12 PO) Take 1 mg by mouth every other day.      ezetimibe (ZETIA) 10 MG tablet Take 1 tablet (10 mg total) by mouth daily. 90 tablet 3   famotidine (PEPCID) 20 MG tablet Take 20 mg by mouth 2 (two) times daily.     hydrocortisone (ANUSOL-HC) 2.5 % rectal cream Place 1 Application rectally 2 (two) times daily. 30 g 0   hydrOXYzine (ATARAX) 25 MG tablet Take 1 tablet (25 mg total) by mouth every 8 (eight) hours as needed for anxiety. 30 tablet 0   Magnesium Citrate 200 MG TABS Take by mouth.     nebivolol (BYSTOLIC) 2.5 MG tablet Take 2.5 mg by mouth in the morning, at noon, and at bedtime.     propranolol (INDERAL) 20 MG tablet Take 1 tablet (20 mg total) by mouth 3 (three) times daily as needed. For breakthrough palpitations. 90 tablet 3   valACYclovir (VALTREX) 500 MG tablet Take 500 mg by mouth 2 (two) times daily as needed (for flares).      cetirizine (ZYRTEC ALLERGY) 10 MG tablet Take 1 tablet (10 mg total) by mouth daily. (Patient not taking: Reported on 05/29/2023) 30 tablet 1   NIFEdipine (PROCARDIA-XL/NIFEDICAL-XL) 30 MG 24 hr tablet Take 1 tablet (30 mg total) by mouth daily as needed (high blood pressure). 30 tablet 1   No current facility-administered medications for this visit.   ALLERGIES:   Iodinated contrast media   SOCIAL HISTORY:  The patient  reports that she quit smoking about 14 years ago. Her smoking use included cigarettes. She started smoking about 34 years ago. She has never used smokeless tobacco. She reports current alcohol use. She reports that she does not use drugs.   FAMILY HISTORY:   family history includes Diabetes in her father and mother; Heart attack in her father; Heart disease in her mother; Hypertension in her father and mother.    REVIEW OF SYSTEMS: Review of Systems  Constitutional: Negative.   HENT: Negative.    Eyes: Negative.   Respiratory: Negative.     Cardiovascular: Negative.  Negative for leg swelling.  Gastrointestinal: Negative.   Genitourinary: Negative.   Musculoskeletal: Negative.   Neurological: Negative.   Psychiatric/Behavioral: Negative.    All other systems reviewed and are negative.   PHYSICAL EXAM: VS:  BP 117/77 (BP Location: Left Arm, Patient Position: Sitting, Cuff Size: Normal)   Pulse 62   Ht 5' (1.524 m)   Wt 178 lb (80.7 kg)   SpO2 96%   BMI 34.76 kg/m  , BMI Body mass index is 34.76 kg/m. Constitutional:  oriented to person, place, and time. No distress.  HENT:  Head: Grossly normal Eyes:  no discharge. No scleral icterus.  Neck: No JVD, no carotid bruits  Cardiovascular: Regular rate and rhythm, no murmurs appreciated Pulmonary/Chest: Clear to auscultation bilaterally, no wheezes or rails Abdominal: Soft.  no distension.  no tenderness.  Musculoskeletal: Normal range of motion Neurological:  normal muscle tone. Coordination normal. No atrophy Skin: Skin warm and dry Psychiatric: normal affect, pleasant  RECENT LABS: 09/26/2022: ALT 19 03/03/2023: Magnesium 2.0; TSH 3.717 05/21/2023: BUN 14; Creatinine, Ser 0.75;  Hemoglobin 12.3; Platelets 207; Potassium 4.1; Sodium 138    LIPID PANEL: Lab Results  Component Value Date   CHOL 236 (H) 10/23/2019   HDL 41 10/23/2019   LDLCALC 144 (H) 10/23/2019   TRIG 282 (H) 10/23/2019      WEIGHT: Wt Readings from Last 3 Encounters:  05/29/23 178 lb (80.7 kg)  05/21/23 176 lb (79.8 kg)  03/03/23 176 lb (79.8 kg)     ASSESSMENT AND PLAN:  Essential hypertension Symptoms controlled on current regimen of beta-blocker On bystolic 2.5 mg 3 times a day,  We have sent a new prescription for nifedipine to take as needed if blood pressure runs high Recommended propranolol for episodic palpitations  Paroxysmal tachycardia Prior monitor with no significant arrhythmia Recommend propranolol for any breakthrough palpitations May need to take with hydroxyzine  for anxiety  Insomnia History of sleep apnea on CPAP Referral to pulmonary, last had CPAP check 2018, weight has trended higher  Atypical chest pain Cardiac CTA previously ordered, no coronary calcification, normal coronary arteries,  No further work-up, left arm tingling is atypical in nature  Anxiety concerning health Reassurance provided Hydroxyzine as needed Does not want something stronger like Xanax  Total encounter time more than 30 minutes. Greater than 50% was spent in counseling and coordination of care with the patient.    Signed, Dossie Arbour, M.D., Ph.D. 05/29/2023  [facility] [place], [place] [phone]

## 2023-05-29 ENCOUNTER — Encounter: Payer: Self-pay | Admitting: Cardiovascular Disease

## 2023-05-29 ENCOUNTER — Ambulatory Visit: Payer: BC Managed Care – PPO | Attending: Cardiovascular Disease | Admitting: Cardiovascular Disease

## 2023-05-29 VITALS — BP 117/77 | HR 62 | Ht 60.0 in | Wt 178.0 lb

## 2023-05-29 DIAGNOSIS — R002 Palpitations: Secondary | ICD-10-CM | POA: Diagnosis not present

## 2023-05-29 DIAGNOSIS — I479 Paroxysmal tachycardia, unspecified: Secondary | ICD-10-CM

## 2023-05-29 DIAGNOSIS — R079 Chest pain, unspecified: Secondary | ICD-10-CM | POA: Diagnosis not present

## 2023-05-29 DIAGNOSIS — I10 Essential (primary) hypertension: Secondary | ICD-10-CM

## 2023-05-29 DIAGNOSIS — E782 Mixed hyperlipidemia: Secondary | ICD-10-CM

## 2023-05-29 DIAGNOSIS — G4733 Obstructive sleep apnea (adult) (pediatric): Secondary | ICD-10-CM

## 2023-05-29 DIAGNOSIS — R4589 Other symptoms and signs involving emotional state: Secondary | ICD-10-CM

## 2023-05-29 MED ORDER — NIFEDIPINE ER OSMOTIC RELEASE 30 MG PO TB24: 30.0000 mg | ORAL_TABLET | Freq: Every day | ORAL | 1 refills | Status: DC | PRN

## 2023-05-29 MED ORDER — PROPRANOLOL HCL 20 MG PO TABS: 20.0000 mg | ORAL_TABLET | Freq: Three times a day (TID) | ORAL | 3 refills | Status: DC | PRN

## 2023-05-29 MED ORDER — EZETIMIBE 10 MG PO TABS: 10.0000 mg | ORAL_TABLET | Freq: Every day | ORAL | 3 refills | Status: DC

## 2023-05-29 NOTE — Patient Instructions (Addendum)
Referral to pulmonary for f/u of OSA on CPAP   Medication Instructions:  Please start zetia 10 mg daily  If you need a refill on your cardiac medications before your next appointment, please call your pharmacy.   Lab work: No new labs needed  Testing/Procedures: No new testing needed  Follow-Up: At [facility], you and your health needs are our priority.  As part of our continuing mission to provide you with exceptional heart care, we have created designated Provider Care Teams.  These Care Teams include your primary Cardiologist (physician) and Advanced Practice Providers (APPs -  Physician Assistants and Nurse Practitioners) who all work together to provide you with the care you need, when you need it.  You will need a follow up appointment in 12 months  Providers on your designated Care Team:   Nicolasa Ducking, NP Eula Listen, PA-C Cadence Fransico [place], [place]  COVID-19 Vaccine Information can be found at: [email or web address][employer]nl For questions related to vaccine distribution or appointments, please email [email or web address] or call [phone]. \

## 2023-05-29 NOTE — Addendum Note (Signed)
Addended by: Vivi Barrack on: 05/29/2023 10:48 AM   Modules accepted: Orders

## 2023-07-05 ENCOUNTER — Other Ambulatory Visit: Payer: Self-pay | Admitting: Cardiovascular Disease

## 2023-07-31 ENCOUNTER — Encounter: Payer: Self-pay | Admitting: Nurse Practitioner

## 2023-07-31 ENCOUNTER — Ambulatory Visit: Payer: BC Managed Care – PPO | Admitting: Nurse Practitioner

## 2023-07-31 VITALS — BP 110/66 | HR 65 | Temp 97.8°F | Ht 60.0 in | Wt 181.4 lb

## 2023-07-31 DIAGNOSIS — R0609 Other forms of dyspnea: Secondary | ICD-10-CM | POA: Insufficient documentation

## 2023-07-31 DIAGNOSIS — G4700 Insomnia, unspecified: Secondary | ICD-10-CM | POA: Insufficient documentation

## 2023-07-31 DIAGNOSIS — G4709 Other insomnia: Secondary | ICD-10-CM

## 2023-07-31 DIAGNOSIS — R918 Other nonspecific abnormal finding of lung field: Secondary | ICD-10-CM | POA: Diagnosis not present

## 2023-07-31 DIAGNOSIS — E669 Obesity, unspecified: Secondary | ICD-10-CM | POA: Diagnosis not present

## 2023-07-31 DIAGNOSIS — G4733 Obstructive sleep apnea (adult) (pediatric): Secondary | ICD-10-CM | POA: Diagnosis not present

## 2023-07-31 MED ORDER — ALBUTEROL SULFATE HFA 108 (90 BASE) MCG/ACT IN AERS: 2.0000 | INHALATION_SPRAY | Freq: Four times a day (QID) | RESPIRATORY_TRACT | 2 refills | Status: DC | PRN

## 2023-07-31 NOTE — Assessment & Plan Note (Signed)
Sleep onset. She will try melatonin OTC. Reassess response at follow up. Encouraged to work on sleep hygiene.

## 2023-07-31 NOTE — Patient Instructions (Addendum)
Continue to use CPAP every night, minimum of 4-6 hours a night.  Change equipment as directed. Wash your tubing with warm soap and water daily, hang to dry. Wash humidifier portion weekly. Use bottled, distilled water and change daily Be aware of reduced alertness and do not drive or operate heavy machinery if experiencing this or drowsiness.  Exercise encouraged, as tolerated. Healthy weight management discussed.  Avoid or decrease alcohol consumption and medications that make you more sleepy, if possible. Notify if persistent daytime sleepiness occurs even with consistent use of PAP therapy.  We will send orders for a new CPAP machine. I need to review your download first. We are reaching out to your medical supply company about this. If we can't get a download, you will need to bring [place] your SD card so we can review Go to [employer] to get a new mask - call them before you go  Try melatonin 1-5 mg At bedtime and see if this helps with your sleep   Trial albuterol inhaler 2 puffs every 6 hours as needed for shortness of breath or wheezing. Notify if symptoms persist despite rescue inhaler/neb use.   CT chest to follow up on the lung nodules  Pulmonary function testing ordered   Follow up in 8 weeks with Dr. Belia Heman or Philis Nettle. If symptoms do not improve or worsen, please contact office for sooner follow up or seek emergency care.

## 2023-07-31 NOTE — Assessment & Plan Note (Signed)
Appears to correlate with weight gain and deconditioning.  She does have a history of smoking so will obtain PFTs to rule out smoking-related obstructive lung disease.  I will also send her with albuterol rescue inhaler to trial to see if she receives benefit from use.  Plan to repeat CT imaging to evaluate lung nodules, which will also allow [place] to assess for underlying pulmonary disease. Healthy weight loss encouraged.  Encouraged her to work on graded exercises.

## 2023-07-31 NOTE — Assessment & Plan Note (Signed)
BMI 35. See above. She will discuss with her PCP.

## 2023-07-31 NOTE — Assessment & Plan Note (Signed)
Mild OSA on CPAP.  She has excellent compliance per her report.  Receives benefit from use.  Will need to assess download and then can send orders for new CPAP machine.  Contacted [employer] to tag is in her machine.  There was no information that we could find.  Advised her to take her SD card to [employer] or bring it to our office so we can evaluate this.  Once received, can send orders for new CPAP machine.  Aware of risks of untreated sleep apnea.  Understands proper care/use of device.  Safe driving practices reviewed.  Patient Instructions  Continue to use CPAP every night, minimum of 4-6 hours a night.  Change equipment as directed. Wash your tubing with warm soap and water daily, hang to dry. Wash humidifier portion weekly. Use bottled, distilled water and change daily Be aware of reduced alertness and do not drive or operate heavy machinery if experiencing this or drowsiness.  Exercise encouraged, as tolerated. Healthy weight management discussed.  Avoid or decrease alcohol consumption and medications that make you more sleepy, if possible. Notify if persistent daytime sleepiness occurs even with consistent use of PAP therapy.  We will send orders for a new CPAP machine. I need to review your download first. We are reaching out to your medical supply company about this. If we can't get a download, you will need to bring [place] your SD card so we can review Go to [employer] to get a new mask - call them before you go  Try melatonin 1-5 mg At bedtime and see if this helps with your sleep   Trial albuterol inhaler 2 puffs every 6 hours as needed for shortness of breath or wheezing. Notify if symptoms persist despite rescue inhaler/neb use.   CT chest to follow up on the lung nodules  Pulmonary function testing ordered   Follow up in 8 weeks with Dr. Belia Heman or Philis Nettle. If symptoms do not improve or worsen, please contact office for sooner follow up or seek emergency care.

## 2023-07-31 NOTE — Progress Notes (Addendum)
@Patient  ID: Felicia Barnett, female    DOB: 06/09/1966, 57 y.o.   MRN: [id number]  Chief Complaint  Patient presents with   Consult    Wears CPAP every night x 10 years.     Referring provider: Emogene Morgan, MD  HPI: 57 year old female, former smoker referred for sleep consult.  Past medical history significant for OSA on CPAP, hypertension, anxiety, HLD.  TEST/EVENTS:  10/2016 PSG: AHI 9.5/h, SpO2 low 82% 06/04/2020 cardiac CT: Small pulmonary nodules in the left lung, largest 5 mm.  Statistically likely benign.  Hepatic steatosis.  Small hiatal hernia. 09/26/2022 CXR: No acute process.  Lungs are clear.  07/31/2023: Today - sleep consult Patient presents today for sleep consult referred by Dr. Letta Pate.  She has a history of mild sleep apnea, originally diagnosed in 2018.  She has been on CPAP since.  She sleeps well with it at night.  Does still have some daytime fatigue some days. She is due for a new CPAP. Without her CPAP, she has difficulties with waking up frequently.  She also would stop breathing when she slept at night.  She would also wake up choking.  No current issues with drowsy driving, morning headaches, sleep parasomnia/paralysis.  She does still have trouble falling asleep most nights..  She wanted to try melatonin but was worried that she should not do it because of her sleep apnea.  She has never had any prescription medications.  She goes to bed between 11 PM and 1 AM.  Some nights takes her a long time to fall asleep.  Has seemed to get worse with her going through menopause.  Use he can stay asleep once she falls asleep.  Gets up around 6:30 AM.  She has had 20 pound weight gain over the last 2 years.  Last sleep study was in 2018.  She is on CPAP 10 cm of water.  Does not wear any supplemental oxygen.  Wearing a fullface mask.  Having issues with mask fit and feeling like it is too tight since she has gained the weight. She has a history of high blood pressure.  No  history of stroke or diabetes.  She is a former smoker.  10-pack-year history.  Quit in 2010.  No excessive alcohol or caffeine intake.  Lives with her husband.  She works at [employer].  Does not operate any heavy machinery in her job Animal nutritionist.  No significant family history reported.  She also been having some difficulties with shortness of breath over the past year or so.  She only notices it with exercise and dancing.  It did start after she had gained her 20 pounds.  She denies any cough, wheezing, chest congestion, leg swelling, orthopnea, PND. No hx of asthma or lung disease. No known exposures.  She does have a history of lung nodules that were incidentally found on cardiac CT in 2021.  Never had follow-up regarding these.  Largest was a 5 mm.  She is concerned because she does have a history of smoking.  Denies any hemoptysis, weight loss, anorexia, night sweats.  No issues with fevers or chills.  Epworth 4  Allergies  Allergen Reactions   Iodinated Contrast Media Hives    Pt was injected with 85mL of Omnipaque 350 and approximately 15 minutes after injection she developed 5-6 hives all over. She was given 50mg  PO Benadryl and Dr. Victorino Dike, our Radiologist came and evaluated her. She was observed for 1 hour and  released without complications. SPM Pt was injected with 85mL of Omnipaque 350 and approximately 15 minutes after injection she developed 5-6 hives all over. She was given 50mg  PO Benadryl and Dr. Victorino Dike, our Radiologist came and evaluated her. She was observed for 1 hour and released without complications. SPM    Immunization History  Administered Date(s) Administered   Influenza-Unspecified 07/10/2013   PFIZER Comirnaty(Gray Top)Covid-19 Tri-Sucrose Vaccine 12/16/2019, 01/16/2020   PFIZER(Purple Top)SARS-COV-2 Vaccination 12/16/2019, 01/16/2020    Past Medical History:  Diagnosis Date   Anxiety    since surgery, pt has had attacks w/ fear of not being able to move her leg  (anxiety attacks occur  3-4x month.)   Diabetes mellitus without complication (HCC)    pre-diabetes   H/O prior ablation treatment    Heart murmur    palpatiations   Hyperlipidemia    Hypertension    SVT (supraventricular tachycardia) (HCC)     Tobacco History: Social History   Tobacco Use  Smoking Status Former   Current packs/day: 0.00   Average packs/day: 0.5 packs/day for 20.0 years (10.0 ttl pk-yrs)   Types: Cigarettes   Start date: 03/02/1989   Quit date: 03/02/2009   Years since quitting: 14.4  Smokeless Tobacco Never   Counseling given: Not Answered   Outpatient Medications Prior to Visit  Medication Sig Dispense Refill   Ascorbic Acid (VITAMIN C) 100 MG tablet Take 100 mg by mouth daily.     aspirin 81 MG chewable tablet Chew by mouth as needed.     cetirizine (ZYRTEC ALLERGY) 10 MG tablet Take 1 tablet (10 mg total) by mouth daily. 30 tablet 1   cholecalciferol (VITAMIN D) 25 MCG (1000 UT) tablet Take 1,000 Units by mouth daily.     clobetasol cream (TEMOVATE) 0.05 % Apply topically 2 (two) times daily.     Cyanocobalamin (VITAMIN B 12 PO) Take 1 mg by mouth every other day.      ezetimibe (ZETIA) 10 MG tablet Take 1 tablet (10 mg total) by mouth daily. 90 tablet 3   famotidine (PEPCID) 20 MG tablet Take 20 mg by mouth 2 (two) times daily.     hydrocortisone (ANUSOL-HC) 2.5 % rectal cream Place 1 Application rectally 2 (two) times daily. 30 g 0   hydrOXYzine (ATARAX) 25 MG tablet Take 1 tablet (25 mg total) by mouth every 8 (eight) hours as needed for anxiety. 30 tablet 0   Magnesium Citrate 200 MG TABS Take by mouth.     nebivolol (BYSTOLIC) 2.5 MG tablet Take 2.5 mg by mouth in the morning, at noon, and at bedtime.     NIFEdipine (PROCARDIA-XL/NIFEDICAL-XL) 30 MG 24 hr tablet TAKE 1 TABLET BY MOUTH DAILY AS NEEDED (HIGH BLOOD PRESSURE). 90 tablet 1   propranolol (INDERAL) 20 MG tablet Take 1 tablet (20 mg total) by mouth 3 (three) times daily as needed. For  breakthrough palpitations. 90 tablet 3   valACYclovir (VALTREX) 500 MG tablet Take 500 mg by mouth 2 (two) times daily as needed (for flares).      No facility-administered medications prior to visit.     Review of Systems:   Constitutional: No night sweats, fevers, chills,  or lassitude. +fatigue, weight gain HEENT: No headaches, difficulty swallowing, tooth/dental problems, or sore throat. No sneezing, itching, ear ache, nasal congestion, or post nasal drip CV:  No chest pain, orthopnea, PND, swelling in lower extremities, anasarca, dizziness, palpitations, syncope Resp: +shortness of breath with exertion. No excess mucus or change  in color of mucus. No productive or non-productive. No hemoptysis. No wheezing.  No chest wall deformity GI:  No heartburn, indigestion, abdominal pain, nausea, vomiting, diarrhea, change in bowel habits, loss of appetite, bloody stools.  GU: No dysuria, change in color of urine, urgency or frequency.  No flank pain, no hematuria  Skin: No rash, lesions, ulcerations MSK:  No joint pain or swelling.  No decreased range of motion.  No back pain. Neuro: No dizziness or lightheadedness.  Psych: No depression or anxiety. Mood stable. +sleep disturbance    Physical Exam:  BP 110/66 (BP Location: Left Arm, Patient Position: Sitting, Cuff Size: Normal)   Pulse 65   Temp 97.8 F (36.6 C)   Ht 5' (1.524 m)   Wt 181 lb 6.4 oz (82.3 kg)   SpO2 98%   BMI 35.43 kg/m   GEN: Pleasant, interactive, well-appearing; obese; in no acute distress. HEENT:  Normocephalic and atraumatic. EACs patent bilaterally. TM pearly gray with present light reflex bilaterally. PERRLA. Sclera white. Nasal turbinates pink, moist and patent bilaterally. No rhinorrhea present. Oropharynx pink and moist, without exudate or edema. No lesions, ulcerations, or postnasal drip. Mallampati III NECK:  Supple w/ fair ROM. No JVD present. Normal carotid impulses w/o bruits. Thyroid symmetrical with no  goiter or nodules palpated. No lymphadenopathy.   CV: RRR, no m/r/g, no peripheral edema. Pulses intact, +2 bilaterally. No cyanosis, pallor or clubbing. PULMONARY:  Unlabored, regular breathing. Clear bilaterally A&P w/o wheezes/rales/rhonchi. No accessory muscle use.  GI: BS present and normoactive. Soft, non-tender to palpation. No organomegaly or masses detected.  MSK: No erythema, warmth or tenderness. Cap refil <2 sec all extrem. No deformities or joint swelling noted.  Neuro: A/Ox3. No focal deficits noted.   Skin: Warm, no lesions or rashe Psych: Normal affect and behavior. Judgement and thought content appropriate.     Lab Results:  CBC    Component Value Date/Time   WBC 5.2 05/21/2023 0421   RBC 4.12 05/21/2023 0421   HGB 12.3 05/21/2023 0421   HGB 12.6 06/14/2022 1533   HCT 36.5 05/21/2023 0421   HCT 36.3 06/14/2022 1533   PLT 207 05/21/2023 0421   PLT 220 06/14/2022 1533   MCV 88.6 05/21/2023 0421   MCV 86 06/14/2022 1533   MCV 93 10/08/2014 0241   MCH 29.9 05/21/2023 0421   MCHC 33.7 05/21/2023 0421   RDW 12.4 05/21/2023 0421   RDW 12.9 06/14/2022 1533   RDW 13.1 10/08/2014 0241   LYMPHSABS 2.4 05/21/2023 0421   LYMPHSABS 2.3 10/08/2013 1344   MONOABS 0.4 05/21/2023 0421   MONOABS 0.8 10/08/2013 1344   EOSABS 0.2 05/21/2023 0421   EOSABS 0.1 10/08/2013 1344   BASOSABS 0.0 05/21/2023 0421   BASOSABS 0.0 10/08/2013 1344    BMET    Component Value Date/Time   NA 138 05/21/2023 0421   NA 141 10/23/2019 1051   NA 139 10/08/2014 0241   K 4.1 05/21/2023 0421   K 3.8 10/08/2014 0241   CL 107 05/21/2023 0421   CL 105 10/08/2014 0241   CO2 24 05/21/2023 0421   CO2 25 10/08/2014 0241   GLUCOSE 114 (H) 05/21/2023 0421   GLUCOSE 98 10/08/2014 0241   BUN 14 05/21/2023 0421   BUN 10 10/23/2019 1051   BUN 15 10/08/2014 0241   CREATININE 0.75 05/21/2023 0421   CREATININE 0.76 10/08/2014 0241   CALCIUM 8.7 (L) 05/21/2023 0421   CALCIUM 8.8 10/08/2014 0241    GFRNONAA >60  05/21/2023 0421   GFRNONAA >60 10/08/2014 0241   GFRNONAA >60 04/15/2014 0027   GFRAA >60 05/05/2020 0112   GFRAA >60 10/08/2014 0241   GFRAA >60 04/15/2014 0027    BNP    Component Value Date/Time   BNP 239 (H) 10/07/2013 2149     Imaging:  No results found.  Administration History     None           No data to display          No results found for: "NITRICOXIDE"      Assessment & Plan:   OSA on CPAP Mild OSA on CPAP.  She has excellent compliance per her report.  Receives benefit from use.  Will need to assess download and then can send orders for new CPAP machine.  Contacted [facility] to tag is in her machine.  There was no information that we could find.  Advised her to take her SD card to [facility] or bring it to our office so we can evaluate this.  Once received, can send orders for new CPAP machine.  Aware of risks of untreated sleep apnea.  Understands proper care/use of device.  Safe driving practices reviewed.  Patient Instructions  Continue to use CPAP every night, minimum of 4-6 hours a night.  Change equipment as directed. Wash your tubing with warm soap and water daily, hang to dry. Wash humidifier portion weekly. Use bottled, distilled water and change daily Be aware of reduced alertness and do not drive or operate heavy machinery if experiencing this or drowsiness.  Exercise encouraged, as tolerated. Healthy weight management discussed.  Avoid or decrease alcohol consumption and medications that make you more sleepy, if possible. Notify if persistent daytime sleepiness occurs even with consistent use of PAP therapy.  We will send orders for a new CPAP machine. I need to review your download first. We are reaching out to your medical supply company about this. If we can't get a download, you will need to bring [place] your SD card so we can review Go to [employer] to get a new mask - call them before you go  Try melatonin 1-5 mg At bedtime and see if  this helps with your sleep   Trial albuterol inhaler 2 puffs every 6 hours as needed for shortness of breath or wheezing. Notify if symptoms persist despite rescue inhaler/neb use.   CT chest to follow up on the lung nodules  Pulmonary function testing ordered   Follow up in 8 weeks with Dr. Belia Heman or Philis Nettle. If symptoms do not improve or worsen, please contact office for sooner follow up or seek emergency care.    DOE (dyspnea on exertion) Appears to correlate with weight gain and deconditioning.  She does have a history of smoking so will obtain PFTs to rule out smoking-related obstructive lung disease.  I will also send her with albuterol rescue inhaler to trial to see if she receives benefit from use.  Plan to repeat CT imaging to evaluate lung nodules, which will also allow [place] to assess for underlying pulmonary disease. Healthy weight loss encouraged.  Encouraged her to work on graded exercises.  Obesity (BMI 30-39.9) BMI 35. See above. She will discuss with her PCP.  Lung nodules Likely benign. Will repeat CT to ensure stable or resolved given smoking history.   Insomnia Sleep onset. She will try melatonin OTC. Reassess response at follow up. Encouraged to work on sleep hygiene.    I spent 45  minutes of dedicated to the care of this patient on the date of this encounter to include pre-visit review of records, face-to-face time with the patient discussing conditions above, post visit ordering of testing, clinical documentation with the electronic health record, making appropriate referrals as documented, and communicating necessary findings to members of the patients care team.  Noemi Chapel, NP 07/31/2023  Pt aware and understands NP's role.

## 2023-07-31 NOTE — Assessment & Plan Note (Signed)
Likely benign. Will repeat CT to ensure stable or resolved given smoking history.

## 2023-08-01 ENCOUNTER — Emergency Department: Payer: BC Managed Care – PPO

## 2023-08-01 ENCOUNTER — Other Ambulatory Visit: Payer: Self-pay

## 2023-08-01 ENCOUNTER — Emergency Department
Admission: EM | Admit: 2023-08-01 | Discharge: 2023-08-01 | Disposition: A | Discharge status: Home / Self Care | Payer: BC Managed Care – PPO | Attending: Emergency Medicine | Admitting: Emergency Medicine

## 2023-08-01 DIAGNOSIS — I10 Essential (primary) hypertension: Secondary | ICD-10-CM | POA: Diagnosis not present

## 2023-08-01 DIAGNOSIS — R42 Dizziness and giddiness: Secondary | ICD-10-CM | POA: Insufficient documentation

## 2023-08-01 DIAGNOSIS — R002 Palpitations: Secondary | ICD-10-CM | POA: Diagnosis present

## 2023-08-01 LAB — COMPREHENSIVE METABOLIC PANEL
ALT: 23 U/L (ref 0–44)
AST: 20 U/L (ref 15–41)
Albumin: 4.2 g/dL (ref 3.5–5.0)
Alkaline Phosphatase: 65 U/L (ref 38–126)
Anion gap: 10 (ref 5–15)
BUN: 12 mg/dL (ref 6–20)
CO2: 26 mmol/L (ref 22–32)
Calcium: 9.1 mg/dL (ref 8.9–10.3)
Chloride: 102 mmol/L (ref 98–111)
Creatinine, Ser: 0.7 mg/dL (ref 0.44–1.00)
GFR, Estimated: >60 mL/min (ref >60)
Glucose, Bld: 106 mg/dL — ABNORMAL HIGH (ref 70–99)
Potassium: 4.5 mmol/L (ref 3.5–5.1)
Sodium: 138 mmol/L (ref 135–145)
Total Bilirubin: 0.7 mg/dL (ref 0.3–1.2)
Total Protein: 7.3 g/dL (ref 6.5–8.1)

## 2023-08-01 LAB — URINALYSIS, ROUTINE W REFLEX MICROSCOPIC
Bilirubin Urine: NEGATIVE
Glucose, UA: NEGATIVE mg/dL
Hgb urine dipstick: NEGATIVE
Ketones, ur: NEGATIVE mg/dL
Leukocytes,Ua: NEGATIVE
Nitrite: NEGATIVE
Protein, ur: NEGATIVE mg/dL
Specific Gravity, Urine: 1.013 (ref 1.005–1.030)
pH: 7 (ref 5.0–8.0)

## 2023-08-01 LAB — CBC
HCT: 36.5 % (ref 36.0–46.0)
Hemoglobin: 12.5 g/dL (ref 12.0–15.0)
MCH: 30 pg (ref 26.0–34.0)
MCHC: 34.2 g/dL (ref 30.0–36.0)
MCV: 87.7 fL (ref 80.0–100.0)
Platelets: 224 10*3/uL (ref 150–400)
RBC: 4.16 MIL/uL (ref 3.87–5.11)
RDW: 12.1 % (ref 11.5–15.5)
WBC: 5.9 10*3/uL (ref 4.0–10.5)
nRBC: 0 % (ref 0.0–0.2)

## 2023-08-01 LAB — MAGNESIUM: Magnesium: 2.2 mg/dL (ref 1.7–2.4)

## 2023-08-01 LAB — LIPASE, BLOOD: Lipase: 33 U/L (ref 11–51)

## 2023-08-01 LAB — TROPONIN I (HIGH SENSITIVITY): Troponin I (High Sensitivity): 4 ng/L (ref <18)

## 2023-08-01 NOTE — ED Provider Triage Note (Signed)
Emergency Medicine Provider Triage Evaluation Note  Felicia Barnett , a 57 y.o. female  was evaluated in triage.  Pt complains of sudden onset of dizziness, left-sided chest pain radiating to the left arm, abdominal pain that started this morning more around mid abdomen towards the lower side.  No LOC.  Review of Systems  Positive:  Negative:   Physical Exam  BP (!) 148/89   Pulse 60   Resp 17   Ht 5' (1.524 m)   Wt 82.3 kg   SpO2 98%   BMI 35.43 kg/m  Gen:   Awake, no distress   Resp:  Normal effort  MSK:   Moves extremities without difficulty  Other:    Medical Decision Making  Medically screening exam initiated at 11:26 AM.  Appropriate orders placed.  Felicia Barnett was informed that the remainder of the evaluation will be completed by another provider, this initial triage assessment does not replace that evaluation, and the importance of remaining in the ED until their evaluation is complete.     Faythe Ghee, PA-C 08/01/23 1126

## 2023-08-01 NOTE — Discharge Instructions (Signed)
Fortunately your evaluation the emergency department did not show any emergency conditions that would account for your symptoms that came and went this morning.  Call your primary doctor and your cardiologist for follow-up appointment.  Thank you for choosing [place] for your health care today!  Please see your primary doctor this week for a follow up appointment.   If you have any new, worsening, or unexpected symptoms call your doctor right away or come back to the emergency department for reevaluation.  It was my pleasure to care for you today.   Daneil Dan Modesto Charon, MD

## 2023-08-01 NOTE — ED Provider Notes (Signed)
[facility] Provider Note    Event Date/Time   First MD Initiated Contact with Patient 08/01/23 1304     (approximate)   History   Palpitations   HPI  Felicia Barnett is a 57 y.o. female   Past medical history of palpitations, SVT, hyperlipidemia and hypertension, anxiety and diabetes who presents emergency department with an episode during work today  She is otherwise been in her regular state of health no recent illnesses.  She has been using her CPAP machine but she thinks it malfunctioned last night awoke feeling very sleepy this morning after a bad night sleep.  While at work she developed palpitations, dizziness, nauseousness flushed feeling but did not pass out and this episode was transient and completely self resolved.  She feels completely better now.  She denies any chest pain shortness of breath palpitations currently.   No other acute medical complaints.  Independent Historian contributed to assessment above: Husband corroborates information past medical history as above  External Medical Documents Reviewed: [facility]ollan's cardiology note from the summertime documenting past medical history medications history of palpitations      Physical Exam   Triage Vital Signs: ED Triage Vitals  Encounter Vitals Group     BP 08/01/23 1121 (!) 148/89     Systolic BP Percentile --      Diastolic BP Percentile --      Pulse Rate 08/01/23 1121 60     Resp 08/01/23 1121 17     Temp 08/01/23 1126 97.9 F (36.6 C)     Temp Source 08/01/23 1121 Oral     SpO2 08/01/23 1121 98 %     Weight 08/01/23 1122 181 lb 7 oz (82.3 kg)     Height 08/01/23 1122 5' (1.524 m)     Head Circumference --      Peak Flow --      Pain Score 08/01/23 1122 5     Pain Loc --      Pain Education --      Exclude from Growth Chart --     Most recent vital signs: Vitals:   08/01/23 1121 08/01/23 1126  BP: (!) 148/89   Pulse: 60   Resp: 17   Temp:  97.9 F (36.6 C)   SpO2: 98%     General: Awake, no distress.  CV:  Good peripheral perfusion.  Resp:  Normal effort.  Abd:  No distention.  Other:  Well-appearing no acute distress hypertensive otherwise vital signs normal.  Heart sounds normal lung sounds normal soft nontender abdomen appears euvolemic.  Moving all extremities full active range of motion.   ED Results / Procedures / Treatments   Labs (all labs ordered are listed, but only abnormal results are displayed) Labs Reviewed  URINALYSIS, ROUTINE W REFLEX MICROSCOPIC - Abnormal; Notable for the following components:      Result Value   Color, Urine YELLOW (*)    APPearance CLEAR (*)    All other components within normal limits  COMPREHENSIVE METABOLIC PANEL - Abnormal; Notable for the following components:   Glucose, Bld 106 (*)    All other components within normal limits  CBC  LIPASE, BLOOD  MAGNESIUM  CBG MONITORING, ED  TROPONIN I (HIGH SENSITIVITY)  TROPONIN I (HIGH SENSITIVITY)     I ordered and reviewed the above labs they are notable for electrolytes troponin cell counts within normal limits.  EKG  ED ECG REPORT I, Pilar Jarvis, the attending physician, personally  viewed and interpreted this ECG.   Date: 08/01/2023  EKG Time: 1156  Rate: 60  Rhythm: sinus  Axis: nl  Intervals:none  ST&T Change: no stemi    RADIOLOGY I independently reviewed and interpreted chest x-ray and see no obvious opacities or pneumothorax I also reviewed radiologist's formal read.   PROCEDURES:  Critical Care performed: No  Procedures   MEDICATIONS ORDERED IN ED: Medications - No data to display  IMPRESSION / MDM / ASSESSMENT AND PLAN / ED COURSE  I reviewed the triage vital signs and the nursing notes.                                Patient's presentation is most consistent with acute presentation with potential threat to life or bodily function.  Differential diagnosis includes, but is not limited to, arrhythmia,  electrolyte disturbance, vasovagal reaction, considered but less likely ACS PE stroke   The patient is on the cardiac monitor to evaluate for evidence of arrhythmia and/or significant heart rate changes.  MDM:    Patient with transient episode of lightheadedness, nausea, flushed feeling, palpitations that spontaneously resolved.  Check electrolytes all within normal limits and had thyroid studies in May which were normal.  EKG without any ischemic changes nor malignant dysrhythmias.  Without any focal neurologic deficits I doubt stroke.  Check CT head for any intracranial masses bleeding which was negative.  Given negative workup as above, completely asymptomatic now, I doubt she has any cardiopulmonary or neurologic emergencies and I think she can be discharged with close PMD and cardiology follow-up and return with any new or worsening symptoms.        FINAL CLINICAL IMPRESSION(S) / ED DIAGNOSES   Final diagnoses:  Palpitations  Dizziness     Rx / DC Orders   ED Discharge Orders     None        Note:  This document was prepared using Dragon voice recognition software and may include unintentional dictation errors.    Pilar Jarvis, MD 08/01/23 [phone]

## 2023-08-01 NOTE — ED Triage Notes (Signed)
Pt was at work when she began to get dizzy and nauseous. Pt states she felt like she was going to pass out and then laid down. Pt had her bp taken at work which was 170/102. Pt then took her bp medication. Pt states she still feels dizzy and is now having generalized weakness Pt states when the episode happened her chest was hurting on the left side but has now resolved.

## 2023-08-15 ENCOUNTER — Telehealth: Payer: Self-pay | Admitting: Nurse Practitioner

## 2023-08-15 NOTE — Telephone Encounter (Signed)
Patient is returning missed call in reference to CT scan schedule.

## 2023-08-16 NOTE — Telephone Encounter (Signed)
I have spoke with Felicia Barnett and we have her CT scheduled on 08/22/23 @ 5:00pm at [facility] Entrance

## 2023-08-22 ENCOUNTER — Ambulatory Visit
Admission: RE | Admit: 2023-08-22 | Discharge: 2023-08-22 | Disposition: A | Discharge status: Home / Self Care | Payer: BC Managed Care – PPO | Source: Ambulatory Visit | Attending: Nurse Practitioner | Admitting: Nurse Practitioner

## 2023-08-22 DIAGNOSIS — R918 Other nonspecific abnormal finding of lung field: Secondary | ICD-10-CM | POA: Insufficient documentation

## 2023-09-08 ENCOUNTER — Other Ambulatory Visit: Payer: Self-pay | Admitting: Cardiovascular Disease

## 2023-09-12 ENCOUNTER — Telehealth: Payer: Self-pay | Admitting: Cardiovascular Disease

## 2023-09-12 NOTE — Telephone Encounter (Signed)
Patient has concerns with calcification and aortic arthrosclerosis shown on recent CT chest. She would like to know if Dr. Mariah Milling can review her results and provide feedback. Please advise.

## 2023-09-18 NOTE — Telephone Encounter (Signed)
Called patient and notified her of the following from Dr. Mariah Milling.  CT images reviewed Minimal aortic atherosclerosis noted, no significant coronary calcifications If she would like aggressive management, should try to get cholesterol down I do not have recent lipid panel 3 years ago total cholesterol 236 Would recommend starting cholesterol medication such as Crestor 10 mg daily if she would like aggressive therapy to prevent progression Thx TGollan   Patient verbalizes understanding. Patient requesting a Cardiac CTA. Will forward to MD for review.

## 2023-09-25 MED ORDER — ROSUVASTATIN CALCIUM 10 MG PO TABS
10.0000 mg | ORAL_TABLET | Freq: Every day | ORAL | 3 refills | Status: DC
Start: 2023-09-25 — End: 2024-02-14

## 2023-09-25 NOTE — Telephone Encounter (Signed)
Called patient and notified her of the following from Dr. Mariah Milling.   We have performed cardiac CTA August 2021 Calcium score was 0, no blockages anywhere Recent CT images unchanged No indication for repeat study, insurance will not cover Thx TG   Patient verbalizes understanding.

## 2023-10-10 ENCOUNTER — Encounter: Payer: Self-pay | Admitting: Emergency Medicine

## 2023-10-10 ENCOUNTER — Telehealth: Payer: Self-pay | Admitting: Cardiovascular Disease

## 2023-10-10 ENCOUNTER — Other Ambulatory Visit: Payer: Self-pay

## 2023-10-10 ENCOUNTER — Emergency Department
Admission: EM | Admit: 2023-10-10 | Discharge: 2023-10-10 | Disposition: A | Discharge status: Home / Self Care | Payer: BC Managed Care – PPO | Attending: Emergency Medicine | Admitting: Emergency Medicine

## 2023-10-10 ENCOUNTER — Ambulatory Visit: Payer: BC Managed Care – PPO | Attending: Nurse Practitioner

## 2023-10-10 DIAGNOSIS — R002 Palpitations: Secondary | ICD-10-CM | POA: Diagnosis present

## 2023-10-10 DIAGNOSIS — R0609 Other forms of dyspnea: Secondary | ICD-10-CM

## 2023-10-10 DIAGNOSIS — R0789 Other chest pain: Secondary | ICD-10-CM | POA: Insufficient documentation

## 2023-10-10 DIAGNOSIS — R0602 Shortness of breath: Secondary | ICD-10-CM | POA: Insufficient documentation

## 2023-10-10 DIAGNOSIS — Z5321 Procedure and treatment not carried out due to patient leaving prior to being seen by health care provider: Secondary | ICD-10-CM | POA: Insufficient documentation

## 2023-10-10 LAB — CBC
HCT: 37 % (ref 36.0–46.0)
Hemoglobin: 12.8 g/dL (ref 12.0–15.0)
MCH: 30.8 pg (ref 26.0–34.0)
MCHC: 34.6 g/dL (ref 30.0–36.0)
MCV: 88.9 fL (ref 80.0–100.0)
Platelets: 231 10*3/uL (ref 150–400)
RBC: 4.16 MIL/uL (ref 3.87–5.11)
RDW: 12.2 % (ref 11.5–15.5)
WBC: 6.2 10*3/uL (ref 4.0–10.5)
nRBC: 0 % (ref 0.0–0.2)

## 2023-10-10 LAB — BASIC METABOLIC PANEL
Anion gap: 10 (ref 5–15)
BUN: 18 mg/dL (ref 6–20)
CO2: 23 mmol/L (ref 22–32)
Calcium: 9.2 mg/dL (ref 8.9–10.3)
Chloride: 104 mmol/L (ref 98–111)
Creatinine, Ser: 0.72 mg/dL (ref 0.44–1.00)
GFR, Estimated: >60 mL/min (ref >60)
Glucose, Bld: 108 mg/dL — ABNORMAL HIGH (ref 70–99)
Potassium: 4.4 mmol/L (ref 3.5–5.1)
Sodium: 137 mmol/L (ref 135–145)

## 2023-10-10 LAB — TROPONIN I (HIGH SENSITIVITY)
Troponin I (High Sensitivity): 6 ng/L (ref <18)
Troponin I (High Sensitivity): 6 ng/L (ref <18)

## 2023-10-10 MED ORDER — ALBUTEROL SULFATE (2.5 MG/3ML) 0.083% IN NEBU
2.5000 mg | INHALATION_SOLUTION | Freq: Once | RESPIRATORY_TRACT | Status: AC
  Filled 2023-10-10: qty 3

## 2023-10-10 NOTE — Telephone Encounter (Signed)
Patient is requesting call back to discuss results from her time in hospital. States she was unable to stay for the long amount of time due to time constraints, and did not get to speak to a doctor. Requesting to speak to someone today to get results.

## 2023-10-10 NOTE — ED Triage Notes (Signed)
 Patient ambulatory to triage with steady gait, without difficulty or distress noted; pt reports at 11pm while watching TV began having palpitations, SHOB and chest tightness; BP at home 179/104

## 2023-10-10 NOTE — ED Notes (Signed)
 Pt left from waiting room prior to being seen

## 2023-10-13 ENCOUNTER — Encounter: Payer: Self-pay | Admitting: Nurse Practitioner

## 2023-10-13 ENCOUNTER — Ambulatory Visit (INDEPENDENT_AMBULATORY_CARE_PROVIDER_SITE_OTHER): Payer: 59 | Admitting: Nurse Practitioner

## 2023-10-13 ENCOUNTER — Other Ambulatory Visit: Payer: Self-pay | Admitting: Nurse Practitioner

## 2023-10-13 VITALS — BP 116/72 | HR 65 | Temp 97.6°F | Ht 60.0 in | Wt 180.0 lb

## 2023-10-13 DIAGNOSIS — G4733 Obstructive sleep apnea (adult) (pediatric): Secondary | ICD-10-CM

## 2023-10-13 DIAGNOSIS — R0609 Other forms of dyspnea: Secondary | ICD-10-CM

## 2023-10-13 NOTE — Progress Notes (Signed)
 09/09/2023-10/08/2023:  CPAP 10 cmH2O Download 30/30 days; 100% >4 hr; average use 6 hr 6 min Leaks 12.1 AHI 7.6  Orders placed for new CPAP. Adjusted to 10-15 cmH2O. Pt aware of changes.

## 2023-10-13 NOTE — Progress Notes (Deleted)
 @Patient  ID: Felicia Barnett, female    DOB: [birth year], 58 y.o.   MRN: [id number]  Chief Complaint  Patient presents with   Follow-up    Wearing CPAP nightly. No problems with mask or pressure.     Referring provider: [name] LABOR, MD  HPI: 58 year old female, former smoker referred for sleep consult.  Past medical history significant for OSA on CPAP, hypertension, anxiety, HLD.  TEST/EVENTS:  10/2016 PSG: AHI 9.5/h, SpO2 low 82% 06/04/2020 cardiac CT: Small pulmonary nodules in the left lung, largest 5 mm.  Statistically likely benign.  Hepatic steatosis.  Small hiatal hernia. 09/26/2022 CXR: No acute process.  Lungs are clear.  07/31/2023: Today - sleep consult Patient presents today for sleep consult referred by Dr. Lorel.  She has a history of mild sleep apnea, originally diagnosed in 2018.  She has been on CPAP since.  She sleeps well with it at night.  Does still have some daytime fatigue some days. She is due for a new CPAP. Without her CPAP, she has difficulties with waking up frequently.  She also would stop breathing when she slept at night.  She would also wake up choking.  No current issues with drowsy driving, morning headaches, sleep parasomnia/paralysis.  She does still have trouble falling asleep most nights..  She wanted to try melatonin but was worried that she should not do it because of her sleep apnea.  She has never had any prescription medications.  She goes to bed between 11 PM and 1 AM.  Some nights takes her a long time to fall asleep.  Has seemed to get worse with her going through menopause.  Use he can stay asleep once she falls asleep.  Gets up around 6:30 AM.  She has had 20 pound weight gain over the last 2 years.  Last sleep study was in 2018.  She is on CPAP 10 cm of water.  Does not wear any supplemental oxygen.  Wearing a fullface mask.  Having issues with mask fit and feeling like it is too tight since she has gained the weight. She has a history of  high blood pressure.  No history of stroke or diabetes.  She is a former smoker.  10-pack-year history.  Quit in 2010.  No excessive alcohol or caffeine intake.  Lives with her husband.  She works at [employer].  Does not operate any heavy machinery in her job animal nutritionist.  No significant family history reported.  She also been having some difficulties with shortness of breath over the past year or so.  She only notices it with exercise and dancing.  It did start after she had gained her 20 pounds.  She denies any cough, wheezing, chest congestion, leg swelling, orthopnea, PND. No hx of asthma or lung disease. No known exposures.  She does have a history of lung nodules that were incidentally found on cardiac CT in 2021.  Never had follow-up regarding these.  Largest was a 5 mm.  She is concerned because she does have a history of smoking.  Denies any hemoptysis, weight loss, anorexia, night sweats.  No issues with fevers or chills.  Epworth 4  Allergies  Allergen Reactions   Iodinated Contrast Media Hives    Pt was injected with 85mL of Omnipaque  350 and approximately 15 minutes after injection she developed 5-6 hives all over. She was given 50mg  PO Benadryl  and Dr. Walden, our Radiologist came and evaluated her. She was observed for 1  hour and released without complications. SPM Pt was injected with 85mL of Omnipaque  350 and approximately 15 minutes after injection she developed 5-6 hives all over. She was given 50mg  PO Benadryl  and Dr. Walden, our Radiologist came and evaluated her. She was observed for 1 hour and released without complications. SPM    Immunization History  Administered Date(s) Administered   Influenza-Unspecified 07/10/2013   PFIZER Comirnaty(Gray Top)Covid-19 Tri-Sucrose Vaccine 12/16/2019, 01/16/2020   PFIZER(Purple Top)SARS-COV-2 Vaccination 12/16/2019, 01/16/2020    Past Medical History:  Diagnosis Date   Anxiety    since surgery, pt has had attacks w/ fear of not  being able to move her leg (anxiety attacks occur  3-4x month.)   Diabetes mellitus without complication (HCC)    pre-diabetes   H/O prior ablation treatment    Heart murmur    palpatiations   Hyperlipidemia    Hypertension    SVT (supraventricular tachycardia) (HCC)     Tobacco History: Social History   Tobacco Use  Smoking Status Former   Current packs/day: 0.00   Average packs/day: 0.5 packs/day for 20.0 years (10.0 ttl pk-yrs)   Types: Cigarettes   Start date: 03/02/1989   Quit date: 03/02/2009   Years since quitting: 14.6  Smokeless Tobacco Never   Counseling given: Not Answered   Outpatient Medications Prior to Visit  Medication Sig Dispense Refill   albuterol  (VENTOLIN  HFA) 108 (90 Base) MCG/ACT inhaler Inhale 2 puffs into the lungs every 6 (six) hours as needed for wheezing or shortness of breath. 8 g 2   Ascorbic Acid (VITAMIN C) 100 MG tablet Take 100 mg by mouth daily.     aspirin  81 MG chewable tablet Chew by mouth as needed.     cetirizine  (ZYRTEC  ALLERGY) 10 MG tablet Take 1 tablet (10 mg total) by mouth daily. 30 tablet 1   cholecalciferol (VITAMIN D ) 25 MCG (1000 UT) tablet Take 1,000 Units by mouth daily.     clobetasol cream (TEMOVATE) 0.05 % Apply topically 2 (two) times daily.     Cyanocobalamin (VITAMIN B 12 PO) Take 1 mg by mouth every other day.      ezetimibe  (ZETIA ) 10 MG tablet Take 1 tablet (10 mg total) by mouth daily. 90 tablet 3   famotidine  (PEPCID ) 20 MG tablet Take 20 mg by mouth 2 (two) times daily.     hydrocortisone  (ANUSOL -HC) 2.5 % rectal cream Place 1 Application rectally 2 (two) times daily. 30 g 0   hydrOXYzine  (ATARAX ) 25 MG tablet Take 1 tablet (25 mg total) by mouth every 8 (eight) hours as needed for anxiety. 30 tablet 0   Magnesium  Citrate 200 MG TABS Take by mouth.     nebivolol  (BYSTOLIC ) 2.5 MG tablet Take 2.5 mg by mouth in the morning, at noon, and at bedtime.     NIFEdipine  (PROCARDIA -XL/NIFEDICAL-XL) 30 MG 24 hr tablet TAKE  1 TABLET BY MOUTH DAILY AS NEEDED (HIGH BLOOD PRESSURE). 90 tablet 1   propranolol  (INDERAL ) 20 MG tablet TAKE 1 TABLET (20 MG TOTAL) BY MOUTH 3 (THREE) TIMES DAILY AS NEEDED. FOR BREAKTHROUGH PALPITATIONS. 270 tablet 3   rosuvastatin  (CRESTOR ) 10 MG tablet Take 1 tablet (10 mg total) by mouth daily. 90 tablet 3   valACYclovir  (VALTREX ) 500 MG tablet Take 500 mg by mouth 2 (two) times daily as needed (for flares).      Facility-Administered Medications Prior to Visit  Medication Dose Route Frequency Provider Last Rate Last Admin   albuterol  (PROVENTIL ) (2.5 MG/3ML) 0.083%  nebulizer solution 2.5 mg  2.5 mg Nebulization Once Dalexa Gentz, Comer GAILS, NP         Review of Systems:   Constitutional: No night sweats, fevers, chills,  or lassitude. +fatigue, weight gain HEENT: No headaches, difficulty swallowing, tooth/dental problems, or sore throat. No sneezing, itching, ear ache, nasal congestion, or post nasal drip CV:  No chest pain, orthopnea, PND, swelling in lower extremities, anasarca, dizziness, palpitations, syncope Resp: +shortness of breath with exertion. No excess mucus or change in color of mucus. No productive or non-productive. No hemoptysis. No wheezing.  No chest wall deformity GI:  No heartburn, indigestion, abdominal pain, nausea, vomiting, diarrhea, change in bowel habits, loss of appetite, bloody stools.  GU: No dysuria, change in color of urine, urgency or frequency.  No flank pain, no hematuria  Skin: No rash, lesions, ulcerations MSK:  No joint pain or swelling.  No decreased range of motion.  No back pain. Neuro: No dizziness or lightheadedness.  Psych: No depression or anxiety. Mood stable. +sleep disturbance    Physical Exam:  BP 116/72 (BP Location: Left Arm, Patient Position: Sitting, Cuff Size: Normal)   Pulse 65   Temp 97.6 F (36.4 C) (Temporal)   Ht 5' (1.524 m)   Wt 180 lb (81.6 kg)   SpO2 98%   BMI 35.15 kg/m   GEN: Pleasant, interactive, well-appearing;  obese; in no acute distress. HEENT:  Normocephalic and atraumatic. EACs patent bilaterally. TM pearly gray with present light reflex bilaterally. PERRLA. Sclera white. Nasal turbinates pink, moist and patent bilaterally. No rhinorrhea present. Oropharynx pink and moist, without exudate or edema. No lesions, ulcerations, or postnasal drip. Mallampati III NECK:  Supple w/ fair ROM. No JVD present. Normal carotid impulses w/o bruits. Thyroid  symmetrical with no goiter or nodules palpated. No lymphadenopathy.   CV: RRR, no m/r/g, no peripheral edema. Pulses intact, +2 bilaterally. No cyanosis, pallor or clubbing. PULMONARY:  Unlabored, regular breathing. Clear bilaterally A&P w/o wheezes/rales/rhonchi. No accessory muscle use.  GI: BS present and normoactive. Soft, non-tender to palpation. No organomegaly or masses detected.  MSK: No erythema, warmth or tenderness. Cap refil <2 sec all extrem. No deformities or joint swelling noted.  Neuro: A/Ox3. No focal deficits noted.   Skin: Warm, no lesions or rashe Psych: Normal affect and behavior. Judgement and thought content appropriate.     Lab Results:  CBC    Component Value Date/Time   WBC 6.2 10/10/2023 0109   RBC 4.16 10/10/2023 0109   HGB 12.8 10/10/2023 0109   HGB 12.6 06/14/2022 1533   HCT 37.0 10/10/2023 0109   HCT 36.3 06/14/2022 1533   PLT 231 10/10/2023 0109   PLT 220 06/14/2022 1533   MCV 88.9 10/10/2023 0109   MCV 86 06/14/2022 1533   MCV 93 10/08/2014 0241   MCH 30.8 10/10/2023 0109   MCHC 34.6 10/10/2023 0109   RDW 12.2 10/10/2023 0109   RDW 12.9 06/14/2022 1533   RDW 13.1 10/08/2014 0241   LYMPHSABS 2.4 05/21/2023 0421   LYMPHSABS 2.3 10/08/2013 1344   MONOABS 0.4 05/21/2023 0421   MONOABS 0.8 10/08/2013 1344   EOSABS 0.2 05/21/2023 0421   EOSABS 0.1 10/08/2013 1344   BASOSABS 0.0 05/21/2023 0421   BASOSABS 0.0 10/08/2013 1344    BMET    Component Value Date/Time   NA 137 10/10/2023 0109   NA 141 10/23/2019  1051   NA 139 10/08/2014 0241   K 4.4 10/10/2023 0109   K 3.8 10/08/2014 0241  CL 104 10/10/2023 0109   CL 105 10/08/2014 0241   CO2 23 10/10/2023 0109   CO2 25 10/08/2014 0241   GLUCOSE 108 (H) 10/10/2023 0109   GLUCOSE 98 10/08/2014 0241   BUN 18 10/10/2023 0109   BUN 10 10/23/2019 1051   BUN 15 10/08/2014 0241   CREATININE 0.72 10/10/2023 0109   CREATININE 0.76 10/08/2014 0241   CALCIUM  9.2 10/10/2023 0109   CALCIUM  8.8 10/08/2014 0241   GFRNONAA >60 10/10/2023 0109   GFRNONAA >60 10/08/2014 0241   GFRNONAA >60 04/15/2014 0027   GFRAA >60 05/05/2020 0112   GFRAA >60 10/08/2014 0241   GFRAA >60 04/15/2014 0027    BNP    Component Value Date/Time   BNP 239 (H) 10/07/2013 2149     Imaging:  No results found.  Administration History     None           No data to display          No results found for: NITRICOXIDE      Assessment & Plan:   No problem-specific Assessment & Plan notes found for this encounter.    I spent 45 minutes of dedicated to the care of this patient on the date of this encounter to include pre-visit review of records, face-to-face time with the patient discussing conditions above, post visit ordering of testing, clinical documentation with the electronic health record, making appropriate referrals as documented, and communicating necessary findings to members of the patients care team.  Comer LULLA Rouleau, NP 10/13/2023  Pt aware and understands NP's role.

## 2023-10-16 NOTE — Telephone Encounter (Signed)
 Called patient and notified her of the following from Dr. Mariah Milling.   I looked at the lab work from the emergency room It is all normal Thx TGollan    Patient verbalizes understanding.

## 2023-10-17 ENCOUNTER — Ambulatory Visit: Payer: 59 | Attending: Nurse Practitioner

## 2023-10-17 DIAGNOSIS — R0609 Other forms of dyspnea: Secondary | ICD-10-CM | POA: Diagnosis present

## 2023-10-17 DIAGNOSIS — Z87891 Personal history of nicotine dependence: Secondary | ICD-10-CM | POA: Insufficient documentation

## 2023-10-17 LAB — PULMONARY FUNCTION TEST ARMC ONLY
DL/VA % pred: 91 %
DL/VA: 4.01 ml/min/mmHg/L
DLCO unc % pred: 95 %
DLCO unc: 16.87 ml/min/mmHg
FEF 25-75 Post: 2.3 L/s
FEF 25-75 Pre: 1.7 L/s
FEF2575-%Change-Post: 35 %
FEF2575-%Pred-Post: 105 %
FEF2575-%Pred-Pre: 78 %
FEV1-%Change-Post: 5 %
FEV1-%Pred-Post: 94 %
FEV1-%Pred-Pre: 90 %
FEV1-Post: 2.1 L
FEV1-Pre: 1.99 L
FEV1FVC-%Change-Post: 4 %
FEV1FVC-%Pred-Pre: 104 %
FEV6-%Change-Post: 0 %
FEV6-%Pred-Post: 88 %
FEV6-%Pred-Pre: 88 %
FEV6-Post: 2.45 L
FEV6-Pre: 2.44 L
FEV6FVC-%Pred-Post: 103 %
FEV6FVC-%Pred-Pre: 103 %
FVC-%Change-Post: 0 %
FVC-%Pred-Post: 85 %
FVC-%Pred-Pre: 85 %
FVC-Post: 2.45 L
FVC-Pre: 2.44 L
Post FEV1/FVC ratio: 86 %
Post FEV6/FVC ratio: 100 %
Pre FEV1/FVC ratio: 82 %
Pre FEV6/FVC Ratio: 100 %
RV % pred: 72 %
RV: 1.28 L
TLC % pred: 83 %
TLC: 3.73 L

## 2023-10-17 MED ORDER — ALBUTEROL SULFATE (2.5 MG/3ML) 0.083% IN NEBU
2.5000 mg | INHALATION_SOLUTION | Freq: Once | RESPIRATORY_TRACT | Status: AC
  Administered 2023-10-17: 2.5 mg via RESPIRATORY_TRACT
  Filled 2023-10-17: qty 3

## 2023-10-19 NOTE — Progress Notes (Signed)
 Lung function testing was normal. Thanks.

## 2023-11-09 ENCOUNTER — Other Ambulatory Visit: Payer: Self-pay | Admitting: Family Medicine

## 2023-11-09 DIAGNOSIS — Z1231 Encounter for screening mammogram for malignant neoplasm of breast: Secondary | ICD-10-CM

## 2023-11-17 NOTE — Progress Notes (Signed)
 Visit canceled

## 2023-11-21 ENCOUNTER — Ambulatory Visit: Payer: 59 | Admitting: Nurse Practitioner

## 2023-12-01 ENCOUNTER — Ambulatory Visit: Payer: 59 | Admitting: Nurse Practitioner

## 2023-12-07 ENCOUNTER — Ambulatory Visit
Admission: RE | Admit: 2023-12-07 | Discharge: 2023-12-07 | Disposition: A | Discharge status: Home / Self Care | Payer: 59 | Source: Ambulatory Visit | Attending: Family Medicine | Admitting: Family Medicine

## 2023-12-07 DIAGNOSIS — Z1231 Encounter for screening mammogram for malignant neoplasm of breast: Secondary | ICD-10-CM | POA: Diagnosis present

## 2023-12-31 ENCOUNTER — Ambulatory Visit
Admission: EM | Admit: 2023-12-31 | Discharge: 2023-12-31 | Disposition: A | Discharge status: Home / Self Care | Attending: Emergency Medicine | Admitting: Emergency Medicine

## 2023-12-31 DIAGNOSIS — J069 Acute upper respiratory infection, unspecified: Secondary | ICD-10-CM

## 2023-12-31 LAB — POC COVID19/FLU A&B COMBO
Covid Antigen, POC: NEGATIVE
Influenza A Antigen, POC: NEGATIVE
Influenza B Antigen, POC: NEGATIVE

## 2023-12-31 NOTE — Discharge Instructions (Addendum)
 The COVID and flu tests are negative.   Take Tylenol or ibuprofen as needed for fever or discomfort.  Take plain Mucinex as needed for congestion.  Rest and keep yourself hydrated.    Follow-up with your primary care provider if your symptoms are not improving.

## 2023-12-31 NOTE — ED Provider Notes (Signed)
 Felicia Barnett    CSN: [id number] Arrival date & time: 12/31/23  [id number]      History   Chief Complaint Chief Complaint  Patient presents with   Generalized Body Aches    HPI Felicia Barnett is a 58 y.o. female.  Patient presents with 1 day history of fever, headache, body aches, congestion, runny nose, cough.  Tmax 100.7.  No OTC medications taken.  No shortness of breath, vomiting, diarrhea.  The history is provided by the patient and medical records.    Past Medical History:  Diagnosis Date   Anxiety    since surgery, pt has had attacks w/ fear of not being able to move her leg (anxiety attacks occur  3-4x month.)   Diabetes mellitus without complication (HCC)    pre-diabetes   H/O prior ablation treatment    Heart murmur    palpatiations   Hyperlipidemia    Hypertension    SVT (supraventricular tachycardia) (HCC)     Patient Active Problem List   Diagnosis Date Noted   OSA on CPAP 07/31/2023   DOE (dyspnea on exertion) 07/31/2023   Obesity (BMI 30-39.9) 07/31/2023   Lung nodules 07/31/2023   Insomnia 07/31/2023   History of Helicobacter pylori infection    Paroxysmal tachycardia (HCC) 12/24/2018   Chest pain 12/24/2018   Palpitations 12/24/2018   Essential hypertension 12/24/2018   Low potassium syndrome 12/24/2018   PVC's (premature ventricular contractions) 12/24/2018   Other fatigue 12/24/2018   Lower extremity neuropathy 08/07/2015   Paresthesia of left leg 07/14/2015   Anxiety 10/29/2013   Hyperlipidemia 10/29/2013    Past Surgical History:  Procedure Laterality Date   ABDOMINAL HYSTERECTOMY     BREAST BIOPSY Right    neg,excisional bx   cardial ablation     anxiety attack (heart palpitations HR=210 bpm) the day she was released from the hospital from surgery. Findings were neg .   COLONOSCOPY     ESOPHAGOGASTRODUODENOSCOPY (EGD) WITH PROPOFOL N/A 08/12/2020   Procedure: ESOPHAGOGASTRODUODENOSCOPY (EGD) WITH PROPOFOL;  Surgeon: Toney Reil, MD;  Location: [facility] ENDOSCOPY;  Service: Gastroenterology;  Laterality: N/A;    OB History   No obstetric history on file.      Home Medications    Prior to Admission medications   Medication Sig Start Date End Date Taking? Authorizing Provider  albuterol (VENTOLIN HFA) 108 (90 Base) MCG/ACT inhaler Inhale 2 puffs into the lungs every 6 (six) hours as needed for wheezing or shortness of breath. 07/31/23   Cobb, Ruby Cola, NP  Ascorbic Acid (VITAMIN C) 100 MG tablet Take 100 mg by mouth daily.    [provider]  aspirin 81 MG chewable tablet Chew by mouth as needed.    [provider]  cetirizine (ZYRTEC ALLERGY) 10 MG tablet Take 1 tablet (10 mg total) by mouth daily. 09/26/22   Orvil Feil, PA-C  cholecalciferol (VITAMIN D) 25 MCG (1000 UT) tablet Take 1,000 Units by mouth daily.    [provider]  clobetasol cream (TEMOVATE) 0.05 % Apply topically 2 (two) times daily. 03/13/23   [provider]  Cyanocobalamin (VITAMIN B 12 PO) Take 1 mg by mouth every other day.     [provider]  ezetimibe (ZETIA) 10 MG tablet Take 1 tablet (10 mg total) by mouth daily. 05/29/23 10/13/23  Antonieta Iba, MD  famotidine (PEPCID) 20 MG tablet Take 20 mg by mouth 2 (two) times daily. 10/09/20   [provider]  hydrocortisone (ANUSOL-HC) 2.5 % rectal cream Place 1 Application rectally 2 (two) times daily. 05/18/23   Toney Reil, MD  hydrOXYzine (ATARAX) 25 MG tablet Take 1 tablet (25 mg total) by mouth every 8 (eight) hours as needed for anxiety. 05/21/23   Minna Antis, MD  Magnesium Citrate 200 MG TABS Take by mouth.    [provider]  nebivolol (BYSTOLIC) 2.5 MG tablet Take 2.5 mg by mouth in the morning, at noon, and at bedtime.    [provider]  NIFEdipine (PROCARDIA-XL/NIFEDICAL-XL) 30 MG 24 hr tablet TAKE 1 TABLET BY MOUTH DAILY AS NEEDED (HIGH BLOOD PRESSURE). 07/05/23   Antonieta Iba, MD   propranolol (INDERAL) 20 MG tablet TAKE 1 TABLET (20 MG TOTAL) BY MOUTH 3 (THREE) TIMES DAILY AS NEEDED. FOR BREAKTHROUGH PALPITATIONS. 09/11/23   Antonieta Iba, MD  rosuvastatin (CRESTOR) 10 MG tablet Take 1 tablet (10 mg total) by mouth daily. 09/25/23 12/24/23  Antonieta Iba, MD  valACYclovir (VALTREX) 500 MG tablet Take 500 mg by mouth 2 (two) times daily as needed (for flares).     [provider]    Family History Family History  Problem Relation Age of Onset   Heart disease Mother    Diabetes Mother    Hypertension Mother    Heart attack Father    Diabetes Father    Hypertension Father    Breast cancer Neg Hx     Social History Social History   Tobacco Use   Smoking status: Former    Current packs/day: 0.00    Average packs/day: 0.5 packs/day for 20.0 years (10.0 ttl pk-yrs)    Types: Cigarettes    Start date: 03/02/1989    Quit date: 03/02/2009    Years since quitting: 14.8   Smokeless tobacco: Never  Vaping Use   Vaping status: Never Used  Substance Use Topics   Alcohol use: Yes    Comment: occ   Drug use: No     Allergies   Iodinated contrast media   Review of Systems Review of Systems  Constitutional:  Positive for fever. Negative for chills.  HENT:  Positive for congestion, ear pain, postnasal drip, rhinorrhea and sore throat.   Respiratory:  Positive for cough. Negative for shortness of breath.   Cardiovascular:  Negative for chest pain and palpitations.  Gastrointestinal:  Negative for diarrhea and vomiting.     Physical Exam Triage Vital Signs ED Triage Vitals  Encounter Vitals Group     BP 12/31/23 1012 (!) 143/88     Systolic BP Percentile --      Diastolic BP Percentile --      Pulse Rate 12/31/23 1012 86     Resp 12/31/23 1012 18     Temp 12/31/23 1012 97.8 F (36.6 C)     Temp src --      SpO2 12/31/23 1012 97 %     Weight --      Height --      Head Circumference --      Peak Flow --      Pain Score 12/31/23 1009  8     Pain Loc --      Pain Education --      Exclude from Growth Chart --    No data found.  Updated Vital Signs BP (!) 143/88   Pulse 86   Temp 97.8 F (36.6 C)   Resp 18   SpO2 97%   Visual Acuity Right Eye  Distance:   Left Eye Distance:   Bilateral Distance:    Right Eye Near:   Left Eye Near:    Bilateral Near:     Physical Exam Constitutional:      General: She is not in acute distress. HENT:     Right Ear: Tympanic membrane normal.     Left Ear: Tympanic membrane normal.     Nose: Congestion and rhinorrhea present.     Mouth/Throat:     Mouth: Mucous membranes are moist.     Pharynx: Oropharynx is clear.  Cardiovascular:     Rate and Rhythm: Normal rate and regular rhythm.     Heart sounds: Normal heart sounds.  Pulmonary:     Effort: Pulmonary effort is normal. No respiratory distress.     Breath sounds: Normal breath sounds.  Neurological:     Mental Status: She is alert.      UC Treatments / Results  Labs (all labs ordered are listed, but only abnormal results are displayed) Labs Reviewed  POC COVID19/FLU A&B COMBO    EKG   Radiology No results found.  Procedures Procedures (including critical care time)  Medications Ordered in UC Medications - No data to display  Initial Impression / Assessment and Plan / UC Course  I have reviewed the triage vital signs and the nursing notes.  Pertinent labs & imaging results that were available during my care of the patient were reviewed by me and considered in my medical decision making (see chart for details).    Viral URI.  Rapid COVID and flu negative.  Discussed symptomatic treatment including Tylenol or ibuprofen as needed for fever or discomfort, plain Mucinex as needed for congestion, rest, hydration.  Instructed patient to follow-up with PCP if not improving.  ED precautions given.  Patient agrees to plan of care.  Final Clinical Impressions(s) / UC Diagnoses   Final diagnoses:  Viral  URI     Discharge Instructions      The COVID and flu tests are negative.   Take Tylenol or ibuprofen as needed for fever or discomfort.  Take plain Mucinex as needed for congestion.  Rest and keep yourself hydrated.    Follow-up with your primary care provider if your symptoms are not improving.         ED Prescriptions   None    PDMP not reviewed this encounter.   Mickie Bail, NP 12/31/23 [phone]

## 2023-12-31 NOTE — ED Triage Notes (Signed)
 Patient to Urgent Care with complaints of body aches/ headaches/ runny nose/ fevers (100)/ nasal congestion/ cough.  Symptoms started last night. Multiple sick contacts.   Meds: nothing otc. Concerned d/t high BP.

## 2024-01-31 ENCOUNTER — Emergency Department

## 2024-01-31 ENCOUNTER — Encounter: Payer: Self-pay | Admitting: Emergency Medicine

## 2024-01-31 ENCOUNTER — Emergency Department
Admission: EM | Admit: 2024-01-31 | Discharge: 2024-01-31 | Disposition: A | Discharge status: Home / Self Care | Attending: Emergency Medicine | Admitting: Emergency Medicine

## 2024-01-31 ENCOUNTER — Other Ambulatory Visit: Payer: Self-pay

## 2024-01-31 DIAGNOSIS — E119 Type 2 diabetes mellitus without complications: Secondary | ICD-10-CM | POA: Insufficient documentation

## 2024-01-31 DIAGNOSIS — I10 Essential (primary) hypertension: Secondary | ICD-10-CM | POA: Diagnosis not present

## 2024-01-31 DIAGNOSIS — R109 Unspecified abdominal pain: Secondary | ICD-10-CM | POA: Insufficient documentation

## 2024-01-31 DIAGNOSIS — A09 Infectious gastroenteritis and colitis, unspecified: Secondary | ICD-10-CM | POA: Diagnosis not present

## 2024-01-31 DIAGNOSIS — R197 Diarrhea, unspecified: Secondary | ICD-10-CM | POA: Diagnosis present

## 2024-01-31 LAB — URINALYSIS, ROUTINE W REFLEX MICROSCOPIC
Bacteria, UA: NONE SEEN
Bilirubin Urine: NEGATIVE
Glucose, UA: NEGATIVE mg/dL
Hgb urine dipstick: NEGATIVE
Ketones, ur: NEGATIVE mg/dL
Nitrite: NEGATIVE
Protein, ur: NEGATIVE mg/dL
Specific Gravity, Urine: 1.024 (ref 1.005–1.030)
pH: 5 (ref 5.0–8.0)

## 2024-01-31 LAB — COMPREHENSIVE METABOLIC PANEL WITH GFR
ALT: 23 U/L (ref 0–44)
AST: 21 U/L (ref 15–41)
Albumin: 4 g/dL (ref 3.5–5.0)
Alkaline Phosphatase: 61 U/L (ref 38–126)
Anion gap: 10 (ref 5–15)
BUN: 14 mg/dL (ref 6–20)
CO2: 24 mmol/L (ref 22–32)
Calcium: 8.9 mg/dL (ref 8.9–10.3)
Chloride: 105 mmol/L (ref 98–111)
Creatinine, Ser: 0.89 mg/dL (ref 0.44–1.00)
GFR, Estimated: >60 mL/min (ref >60)
Glucose, Bld: 135 mg/dL — ABNORMAL HIGH (ref 70–99)
Potassium: 3.8 mmol/L (ref 3.5–5.1)
Sodium: 139 mmol/L (ref 135–145)
Total Bilirubin: 0.6 mg/dL (ref 0.0–1.2)
Total Protein: 7.4 g/dL (ref 6.5–8.1)

## 2024-01-31 LAB — CBC WITH DIFFERENTIAL/PLATELET
Abs Immature Granulocytes: 0.04 10*3/uL (ref 0.00–0.07)
Basophils Absolute: 0 10*3/uL (ref 0.0–0.1)
Basophils Relative: 1 %
Eosinophils Absolute: 0.2 10*3/uL (ref 0.0–0.5)
Eosinophils Relative: 3 %
HCT: 35.9 % — ABNORMAL LOW (ref 36.0–46.0)
Hemoglobin: 12.3 g/dL (ref 12.0–15.0)
Immature Granulocytes: 1 %
Lymphocytes Relative: 42 %
Lymphs Abs: 2.6 10*3/uL (ref 0.7–4.0)
MCH: 30.7 pg (ref 26.0–34.0)
MCHC: 34.3 g/dL (ref 30.0–36.0)
MCV: 89.5 fL (ref 80.0–100.0)
Monocytes Absolute: 0.5 10*3/uL (ref 0.1–1.0)
Monocytes Relative: 7 %
Neutro Abs: 2.8 10*3/uL (ref 1.7–7.7)
Neutrophils Relative %: 46 %
Platelets: 249 10*3/uL (ref 150–400)
RBC: 4.01 MIL/uL (ref 3.87–5.11)
RDW: 12.3 % (ref 11.5–15.5)
WBC: 6.1 10*3/uL (ref 4.0–10.5)
nRBC: 0 % (ref 0.0–0.2)

## 2024-01-31 LAB — LIPASE, BLOOD: Lipase: 42 U/L (ref 11–51)

## 2024-01-31 MED ORDER — CIPROFLOXACIN HCL 500 MG PO TABS: 500.0000 mg | ORAL_TABLET | Freq: Two times a day (BID) | ORAL | 0 refills | Status: AC

## 2024-01-31 MED ORDER — SODIUM CHLORIDE 0.9 % IV BOLUS
500.0000 mL | Freq: Once | INTRAVENOUS | Status: AC
  Administered 2024-01-31: 500 mL via INTRAVENOUS

## 2024-01-31 MED ORDER — CIPROFLOXACIN HCL 500 MG PO TABS
500.0000 mg | ORAL_TABLET | Freq: Once | ORAL | Status: AC
  Administered 2024-01-31: 500 mg via ORAL
  Filled 2024-01-31: qty 1

## 2024-01-31 NOTE — Discharge Instructions (Signed)
 Take the antibiotic as prescribed for 3 days.  Follow-up with your regular doctor.  Return to the ER for new, worsening, or persistent severe blood in the stool, diarrhea, abdominal pain, fever, or any other new or worsening symptoms that concern you.

## 2024-01-31 NOTE — ED Provider Notes (Signed)
 [facility] Provider Note    Event Date/Time   First MD Initiated Contact with Patient 01/31/24 2006     (approximate)   History   Abdominal Pain   HPI  Felicia Barnett is a 58 y.o. female with a history of palpitations, SVT, hypertension, hyperlipidemia, and diabetes who presents with diarrhea for the last 4 days, persistent course, with a small amount of bright red blood mixed in, associated with crampy abdominal pain and some lightheadedness.  The diarrhea has been persistent for 4 days.  It started while she was in [place].  She states that [place] and [place] some local food so thinks she may have gotten sick from this.  She states she has a history of hemorrhoids with the blood work also could be from the hemorrhoids.  I reviewed the past medical records.  The patient is most recent outpatient encounter was with internal medicine on 2/19 for urinary frequency and low back pain.   Physical Exam   Triage Vital Signs: ED Triage Vitals [01/31/24 1925]  Encounter Vitals Group     BP (!) 159/84     Systolic BP Percentile      Diastolic BP Percentile      Pulse Rate 73     Resp 18     Temp 98 F (36.7 C)     Temp src      SpO2 100 %     Weight 180 lb (81.6 kg)     Height 5' (1.524 m)     Head Circumference      Peak Flow      Pain Score 4     Pain Loc      Pain Education      Exclude from Growth Chart     Most recent vital signs: Vitals:   01/31/24 1925  BP: (!) 159/84  Pulse: 73  Resp: 18  Temp: 98 F (36.7 C)  SpO2: 100%     General: Awake, no distress.  CV:  Good peripheral perfusion.  Resp:  Normal effort.  Abd:  No distention.  Soft with mild diffuse discomfort but no focal tenderness. Other:  No jaundice or scleral icterus.  Rectal exam deferred   ED Results / Procedures / Treatments   Labs (all labs ordered are listed, but only abnormal results are displayed) Labs Reviewed  COMPREHENSIVE  METABOLIC PANEL WITH GFR - Abnormal; Notable for the following components:      Result Value   Glucose, Bld 135 (*)    All other components within normal limits  URINALYSIS, ROUTINE W REFLEX MICROSCOPIC - Abnormal; Notable for the following components:   Color, Urine YELLOW (*)    APPearance HAZY (*)    Leukocytes,Ua SMALL (*)    All other components within normal limits  CBC WITH DIFFERENTIAL/PLATELET - Abnormal; Notable for the following components:   HCT 35.9 (*)    All other components within normal limits  LIPASE, BLOOD     EKG     RADIOLOGY  CT abdomen/pelvis: I independently viewed and interpreted the images; there are no dilated bowel loops or any free air or free fluid.  IMPRESSION:  1. Small hiatal hernia.  2. Sigmoid diverticulosis.     PROCEDURES:  Critical Care performed: No  Procedures   MEDICATIONS ORDERED IN ED: Medications  ciprofloxacin  (CIPRO ) tablet 500 mg (has no administration in time range)  sodium chloride  0.9 % bolus 500 mL (  0 mLs Intravenous Stopped 01/31/24 2151)     IMPRESSION / MDM / ASSESSMENT AND PLAN / ED COURSE  I reviewed the triage vital signs and the nursing notes.  58 year old female with PMH as noted above presents with diarrhea for the last 4 days with a small amount of blood noticed in the last 1 to 2 days, as well as abdominal discomfort and lightheadedness.  On exam the patient is well-appearing.  She is hypertensive with otherwise normal vital signs.  The abdomen is soft with no focal tenderness.  Differential diagnosis includes, but is not limited to, traveler's diarrhea, other foodborne illness, viral gastroenteritis, colitis, diverticulitis, hemorrhoidal bleeding.  At this time given the small amount of blood and overall reassuring exam there is no indication for a rectal exam.  CBC shows normal hemoglobin.  Urinalysis is negative.  CMP shows no acute findings.  Will obtain a CT for further evaluation and give an IV fluid  bolus.  Patient's presentation is most consistent with acute complicated illness / injury requiring diagnostic workup.  ----------------------------------------- 9:51 PM on 01/31/2024 -----------------------------------------  CT is negative.  Overall presentation is consistent with traveler's diarrhea or gastroenteritis.  I counseled the patient on the results of the workup.  She is stable for discharge home at this time.  I will prescribe a 3-day course of Cipro .  I gave strict return precautions and she expressed understanding.   FINAL CLINICAL IMPRESSION(S) / ED DIAGNOSES   Final diagnoses:  Traveler's diarrhea     Rx / DC Orders   ED Discharge Orders          Ordered    ciprofloxacin  (CIPRO ) 500 MG tablet  2 times daily        01/31/24 2150             Note:  This document was prepared using Dragon voice recognition software and may include unintentional dictation errors.    Lind Repine, MD 01/31/24 2152

## 2024-01-31 NOTE — ED Notes (Signed)
 Patient transported to CT

## 2024-01-31 NOTE — ED Triage Notes (Signed)
 Pt presents to the ED via POV with complaints of lower abdominal pain, diarrhea, and some blood when wiping after a BM. Hx of hemorrhoids. She notes going out of the country ([place]) for 10 days and upon returning she developed diarrhea with associated abdominal pain. A&Ox4 at this time. Denies CP or SOB.

## 2024-02-14 ENCOUNTER — Other Ambulatory Visit: Payer: Self-pay

## 2024-02-14 ENCOUNTER — Ambulatory Visit (INDEPENDENT_AMBULATORY_CARE_PROVIDER_SITE_OTHER): Admitting: Gastroenterology

## 2024-02-14 ENCOUNTER — Other Ambulatory Visit
Admission: RE | Admit: 2024-02-14 | Discharge: 2024-02-14 | Disposition: A | Discharge status: Home / Self Care | Source: Ambulatory Visit | Attending: Gastroenterology | Admitting: Gastroenterology

## 2024-02-14 ENCOUNTER — Encounter: Payer: Self-pay | Admitting: Gastroenterology

## 2024-02-14 VITALS — BP 129/78 | HR 65 | Temp 97.8°F | Ht 60.0 in | Wt 181.4 lb

## 2024-02-14 DIAGNOSIS — R140 Abdominal distension (gaseous): Secondary | ICD-10-CM | POA: Diagnosis not present

## 2024-02-14 DIAGNOSIS — R1013 Epigastric pain: Secondary | ICD-10-CM | POA: Diagnosis present

## 2024-02-14 DIAGNOSIS — K219 Gastro-esophageal reflux disease without esophagitis: Secondary | ICD-10-CM

## 2024-02-14 DIAGNOSIS — K649 Unspecified hemorrhoids: Secondary | ICD-10-CM

## 2024-02-14 DIAGNOSIS — R109 Unspecified abdominal pain: Secondary | ICD-10-CM | POA: Diagnosis not present

## 2024-02-14 DIAGNOSIS — R110 Nausea: Secondary | ICD-10-CM

## 2024-02-14 DIAGNOSIS — Z8619 Personal history of other infectious and parasitic diseases: Secondary | ICD-10-CM

## 2024-02-14 MED ORDER — OMEPRAZOLE 40 MG PO CPDR: 40.0000 mg | DELAYED_RELEASE_CAPSULE | Freq: Two times a day (BID) | ORAL | 0 refills | Status: DC

## 2024-02-14 NOTE — Progress Notes (Addendum)
 Karma Oz, MD [address]  [address]  [place], [place] [address]  Main: [phone]  Fax: [phone]    Gastroenterology Consultation  Referring Provider:     Lorina Roosevelt, MD Primary Care Physician:  Lorina Roosevelt, MD Primary Gastroenterologist:  Dr. Karma Oz Reason for Consultation:   Epigastric pain, abdominal bloating, flareup of GERD, hemorrhoidal symptoms        HPI:   ANUSKA ELSER is a 58 y.o. female referred by Dr. Lorina Roosevelt, MD  for consultation & management of chronic abdominal bloating.  Patient was diagnosed with H. pylori infection based on positive H. pylori stool antigen in 03/2020, s/p treatment for 2 weeks.  She has been experiencing severe abdominal bloating for several months which is not improved.  She had a history of constipation, which is partially relieved with eating flaxseed daily.  However, she does have to strain during bowel movement.  She is trying to follow healthy diet and incorporate some exercise, trying to lose weight.  She is taking Pepcid  as needed for heartburn.  She is also found to have fatty liver as well as hiatal hernia which were incidentally found on the coronary calcium  CT scan Patient is taking a lot of supplements including probiotics Patient denies smoking.  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  Her symptoms have modestly improved post treatment.  She reported worsening of acid reflux, started her on Protonix  40 mg twice daily which helps.  She has been experiencing severe constipation, has been gaining weight.  Continues to have abdominal bloating and reflux  Follow-up visit 03/23/2021 Patient continues to have ongoing abdominal bloating.  Her repeat H. pylori breath test came back positive and I prescribed  Pylera .  She could not afford Pylera  and lost her insurance.  Currently she has new insurance and is requesting for H. pylori treatment with Pylera .  She does have ongoing constipation with intermittent loose stools.  Follow-up visit 06/23/2021 Patient has recurrent H. pylori infection, status posttreatment with Pylera .  She reports ongoing abdominal bloating.  Her constipation is under control on Linzess 142 MCG daily.  She is trying to follow a high-fiber diet but appears that she has been consuming fried foods, red meat regularly.  She is gaining weight.  She has immediate postprandial bloating.  She does not have any bloating if she does not eat.  Follow-up visit 06/13/2022 The patient returned from [place] about 3 to 4 weeks ago.  While she was in [place], she was ill, developed abdominal pain with diarrhea.  She states that she was treated with 3 days of antibiotics.  Her diarrhea resolved.  After [place] returned to [place]  from [place], she continued to have upper abdominal discomfort associated with abdominal bloating and nausea.  She had history of recurrent H. pylori infection that was treated in the past.  She had seafood yesterday and noticed a dark stools as well as abdominal discomfort.  Follow-up visit 02/14/2024 Ms. Legner is here to discuss about symptoms of abdominal bloating, worsening of acid reflux, epigastric discomfort.  She also had severe diarrhea after she returned from [place] in April associated with rectal bleeding.  She went to the ER, underwent workup which was unrevealing.  CT scan was unremarkable except for diverticulosis.  She was empirically treated  with ciprofloxacin .  She reports that her diarrhea lasted until Saturday.  For last 2 to 3 days she has been having more or less semiformed to formed bowel movements but associated with rectal pressure and discomfort and prolapse of the hemorrhoid whenever she has a bowel movement.  She tried hydrocortisone  per rectum in the past which resulted  in elevated blood pressure.  She is no longer experiencing rectal bleeding.  She takes famotidine  for her heartburn.  She is concerned if she has H. pylori.  She has been gaining weight, not very active, consumes sweets and simple carbs  NSAIDs: None  Antiplts/Anticoagulants/Anti thrombotics: None  GI Procedures: Underwent colonoscopy at age 19, reportedly normal  Upper endoscopy 08/12/20 - Normal duodenal bulb and second portion of the duodenum. - Erythematous mucosa in the gastric fundus and gastric body. Biopsied. - Normal incisura and antrum. Biopsied. - Salmon-colored mucosa suspicious for short-segment Barrett's esophagus. Biopsied. - Small hiatal hernia  DIAGNOSIS:  A. STOMACH, RANDOM; COLD BIOPSY:  - CHRONIC ACTIVE GASTRITIS WITH HELICOBACTER PYLORI TYPE ORGANISMS.  - NEGATIVE FOR DYSPLASIA AND MALIGNANCY.   B. GASTROESOPHAGEAL JUNCTION; COLD BIOPSY:  - SQUAMOUS MUCOSA WITH FEATURES OF REFLUX ESOPHAGITIS.  - NO INCREASE IN INTRAEPITHELIAL EOSINOPHILS (LESS THAN 2 PER HPF).  - NEGATIVE FOR DYSPLASIA AND MALIGNANCY.    Past Medical History:  Diagnosis Date   Anxiety    since surgery, pt has had attacks w/ fear of not being able to move her leg (anxiety attacks occur  3-4x month.)   Diabetes mellitus without complication (HCC)    pre-diabetes   H/O prior ablation treatment    Heart murmur    palpatiations   Hyperlipidemia    Hypertension    SVT (supraventricular tachycardia) (HCC)     Past Surgical History:  Procedure Laterality Date   ABDOMINAL HYSTERECTOMY     BREAST BIOPSY Right    neg,excisional bx   cardial ablation     anxiety attack (heart palpitations HR=210 bpm) the day she was released from the hospital from surgery. Findings were neg .   COLONOSCOPY     ESOPHAGOGASTRODUODENOSCOPY (EGD) WITH PROPOFOL  N/A 08/12/2020   Procedure: ESOPHAGOGASTRODUODENOSCOPY (EGD) WITH PROPOFOL ;  Surgeon: Selena Daily, MD;  Location: [facility] ENDOSCOPY;  Service:  Gastroenterology;  Laterality: N/A;    Current Outpatient Medications:    Ascorbic Acid (VITAMIN C) 100 MG tablet, Take 100 mg by mouth daily., Disp: , Rfl:    aspirin  81 MG chewable tablet, Chew by mouth as needed., Disp: , Rfl:    cholecalciferol (VITAMIN D) 25 MCG (1000 UT) tablet, Take 1,000 Units by mouth daily., Disp: , Rfl:    Cyanocobalamin (VITAMIN B 12 PO), Take 1 mg by mouth every other day. , Disp: , Rfl:    ezetimibe  (ZETIA ) 10 MG tablet, Take 1 tablet (10 mg total) by mouth daily., Disp: 90 tablet, Rfl: 3   famotidine  (PEPCID ) 20 MG tablet, Take 20 mg by mouth 2 (two) times daily., Disp: , Rfl:    Magnesium  Citrate 200 MG TABS, Take by mouth., Disp: , Rfl:    nebivolol  (BYSTOLIC ) 2.5 MG tablet, Take 2.5 mg by mouth in the morning, at noon, and at bedtime., Disp: , Rfl:    Omega-3 1000 MG CAPS, Take 2 g by mouth., Disp: , Rfl:    NIFEdipine  (PROCARDIA -XL/NIFEDICAL-XL) 30 MG 24 hr tablet, TAKE 1 TABLET BY MOUTH DAILY AS NEEDED (HIGH BLOOD PRESSURE). (Patient not taking: Reported on 02/14/2024), Disp: 90 tablet, Rfl: 1 No  current facility-administered medications for this visit.  Facility-Administered Medications Ordered in Other Visits:    albuterol  (PROVENTIL ) (2.5 MG/3ML) 0.083% nebulizer solution 2.5 mg, 2.5 mg, Nebulization, Once, Cobb, Mariah Shines, NP   Family History  Problem Relation Age of Onset   Heart disease Mother    Diabetes Mother    Hypertension Mother    Heart attack Father    Diabetes Father    Hypertension Father    Breast cancer Neg Hx      Social History   Tobacco Use   Smoking status: Former    Current packs/day: 0.00    Average packs/day: 0.5 packs/day for 20.0 years (10.0 ttl pk-yrs)    Types: Cigarettes    Start date: 03/02/1989    Quit date: 03/02/2009    Years since quitting: 14.9   Smokeless tobacco: Never  Vaping Use   Vaping status: Never Used  Substance Use Topics   Alcohol use: Yes    Comment: occ   Drug use: No    Allergies as  of 02/14/2024 - Review Complete 02/14/2024  Allergen Reaction Noted   Iodinated contrast media Hives 06/04/2020    Review of Systems:    All systems reviewed and negative except where noted in HPI.   Physical Exam:  BP 129/78 (BP Location: Left Arm, Patient Position: Sitting, Cuff Size: Normal)   Pulse 65   Temp 97.8 F (36.6 C) (Oral)   Ht 5' (1.524 m)   Wt 181 lb 6 oz (82.3 kg)   BMI 35.42 kg/m  No LMP recorded. Patient has had a hysterectomy.  General:   Alert,  Well-developed, well-nourished, pleasant and cooperative in NAD Head:  Normocephalic and atraumatic. Eyes:  Sclera clear, no icterus.   Conjunctiva pink. Ears:  Normal auditory acuity. Nose:  No deformity, discharge, or lesions. Mouth:  No deformity or lesions,oropharynx pink & moist. Neck:  Supple; no masses or thyromegaly. Lungs:  Respirations even and unlabored.  Clear throughout to auscultation.   No wheezes, crackles, or rhonchi. No acute distress. Heart:  Regular rate and rhythm; no murmurs, clicks, rubs, or gallops. Abdomen:  Normal bowel sounds. Soft, non-tender and moderately distended, tympanic to percussion without masses, hepatosplenomegaly or hernias noted.  No guarding or rebound tenderness.   Rectal: Perianal skin tags Msk:  Symmetrical without gross deformities. Good, equal movement & strength bilaterally. Pulses:  Normal pulses noted. Extremities:  No clubbing or edema.  No cyanosis. Neurologic:  Alert and oriented x3;  grossly normal neurologically. Skin:  Intact without significant lesions or rashes. No jaundice. Psych:  Alert and cooperative. Normal mood and affect.  Imaging Studies: Reviewed  Assessment and Plan:   BARBARAJO JAGGER is a 58 y.o. Hispanic female from [place] with obesity, metabolic syndrome is seen in consultation for chronic abdominal bloating and constipation, history of H. pylori infection s/p treatment with triple therapy as well as Pylera , hiatal hernia and fatty  liver  Abdominal discomfort with abdominal bloating and nausea Patient had history of recurrent H. pylori infections treated in the past Recent travel to [place] and recurrence of symptoms Recommend H. pylori breath test and treat if needed  Flareup of GERD Recommend omeprazole  40 mg 1-2 times daily before meals  Symptomatic hemorrhoid Discussed about conservative measures  Metabolic syndrome Discussed with patient regarding trial of weight loss medication after discussing with PCP Reiterated on healthy lifestyle, healthy eating habits, significantly cut back on simple carbs moderate physical activity daily, 10,000 steps daily  Follow up as  needed   Karma Oz, MD

## 2024-02-15 LAB — H. PYLORI BREATH TEST: H. pylori UBiT: NEGATIVE

## 2024-03-18 ENCOUNTER — Emergency Department
Admission: EM | Admit: 2024-03-18 | Discharge: 2024-03-18 | Disposition: A | Discharge status: Home / Self Care | Attending: Emergency Medicine | Admitting: Emergency Medicine

## 2024-03-18 ENCOUNTER — Encounter: Payer: Self-pay | Admitting: Emergency Medicine

## 2024-03-18 ENCOUNTER — Emergency Department

## 2024-03-18 ENCOUNTER — Other Ambulatory Visit: Payer: Self-pay

## 2024-03-18 DIAGNOSIS — I10 Essential (primary) hypertension: Secondary | ICD-10-CM | POA: Diagnosis not present

## 2024-03-18 DIAGNOSIS — E119 Type 2 diabetes mellitus without complications: Secondary | ICD-10-CM | POA: Insufficient documentation

## 2024-03-18 DIAGNOSIS — Z7982 Long term (current) use of aspirin: Secondary | ICD-10-CM | POA: Diagnosis not present

## 2024-03-18 DIAGNOSIS — R002 Palpitations: Secondary | ICD-10-CM | POA: Diagnosis not present

## 2024-03-18 DIAGNOSIS — Z79899 Other long term (current) drug therapy: Secondary | ICD-10-CM | POA: Diagnosis not present

## 2024-03-18 DIAGNOSIS — N644 Mastodynia: Secondary | ICD-10-CM | POA: Diagnosis present

## 2024-03-18 DIAGNOSIS — N61 Mastitis without abscess: Secondary | ICD-10-CM | POA: Insufficient documentation

## 2024-03-18 LAB — CBC WITH DIFFERENTIAL/PLATELET
Abs Immature Granulocytes: 0.04 10*3/uL (ref 0.00–0.07)
Basophils Absolute: 0 10*3/uL (ref 0.0–0.1)
Basophils Relative: 0 %
Eosinophils Absolute: 0.3 10*3/uL (ref 0.0–0.5)
Eosinophils Relative: 3 %
HCT: 39.1 % (ref 36.0–46.0)
Hemoglobin: 13 g/dL (ref 12.0–15.0)
Immature Granulocytes: 0 %
Lymphocytes Relative: 51 %
Lymphs Abs: 4.7 10*3/uL — ABNORMAL HIGH (ref 0.7–4.0)
MCH: 29.7 pg (ref 26.0–34.0)
MCHC: 33.2 g/dL (ref 30.0–36.0)
MCV: 89.3 fL (ref 80.0–100.0)
Monocytes Absolute: 0.8 10*3/uL (ref 0.1–1.0)
Monocytes Relative: 8 %
Neutro Abs: 3.5 10*3/uL (ref 1.7–7.7)
Neutrophils Relative %: 38 %
Platelets: 227 10*3/uL (ref 150–400)
RBC: 4.38 MIL/uL (ref 3.87–5.11)
RDW: 12.6 % (ref 11.5–15.5)
WBC: 9.2 10*3/uL (ref 4.0–10.5)
nRBC: 0 % (ref 0.0–0.2)

## 2024-03-18 LAB — COMPREHENSIVE METABOLIC PANEL WITH GFR
ALT: 20 U/L (ref 0–44)
AST: 18 U/L (ref 15–41)
Albumin: 4.2 g/dL (ref 3.5–5.0)
Alkaline Phosphatase: 53 U/L (ref 38–126)
Anion gap: 8 (ref 5–15)
BUN: 22 mg/dL — ABNORMAL HIGH (ref 6–20)
CO2: 24 mmol/L (ref 22–32)
Calcium: 9.5 mg/dL (ref 8.9–10.3)
Chloride: 108 mmol/L (ref 98–111)
Creatinine, Ser: 0.7 mg/dL (ref 0.44–1.00)
GFR, Estimated: >60 mL/min (ref >60)
Glucose, Bld: 129 mg/dL — ABNORMAL HIGH (ref 70–99)
Potassium: 4.9 mmol/L (ref 3.5–5.1)
Sodium: 140 mmol/L (ref 135–145)
Total Bilirubin: 0.7 mg/dL (ref 0.0–1.2)
Total Protein: 7.5 g/dL (ref 6.5–8.1)

## 2024-03-18 LAB — TSH: TSH: 4.177 u[IU]/mL (ref 0.350–4.500)

## 2024-03-18 LAB — TROPONIN I (HIGH SENSITIVITY)
Troponin I (High Sensitivity): 6 ng/L (ref <18)
Troponin I (High Sensitivity): 7 ng/L (ref <18)

## 2024-03-18 LAB — D-DIMER, QUANTITATIVE: D-Dimer, Quant: <0.27 ug{FEU}/mL (ref 0.00–0.50)

## 2024-03-18 LAB — MAGNESIUM: Magnesium: 2 mg/dL (ref 1.7–2.4)

## 2024-03-18 MED ORDER — CEPHALEXIN 500 MG PO CAPS
500.0000 mg | ORAL_CAPSULE | Freq: Once | ORAL | Status: AC
  Administered 2024-03-18: 500 mg via ORAL
  Filled 2024-03-18: qty 1

## 2024-03-18 MED ORDER — OXYCODONE-ACETAMINOPHEN 5-325 MG PO TABS
2.0000 | ORAL_TABLET | Freq: Once | ORAL | Status: DC
  Filled 2024-03-18: qty 2

## 2024-03-18 MED ORDER — ONDANSETRON 4 MG PO TBDP: 4.0000 mg | ORAL_TABLET | Freq: Four times a day (QID) | ORAL | 0 refills | Status: DC | PRN

## 2024-03-18 MED ORDER — IBUPROFEN 800 MG PO TABS: 800.0000 mg | ORAL_TABLET | Freq: Three times a day (TID) | ORAL | 0 refills | Status: DC | PRN

## 2024-03-18 MED ORDER — CEPHALEXIN 500 MG PO CAPS: 500.0000 mg | ORAL_CAPSULE | Freq: Four times a day (QID) | ORAL | 0 refills | Status: AC

## 2024-03-18 MED ORDER — OXYCODONE-ACETAMINOPHEN 5-325 MG PO TABS: 2.0000 | ORAL_TABLET | Freq: Three times a day (TID) | ORAL | 0 refills | Status: DC | PRN

## 2024-03-18 MED ORDER — ONDANSETRON 4 MG PO TBDP
4.0000 mg | ORAL_TABLET | Freq: Once | ORAL | Status: DC
  Filled 2024-03-18: qty 1

## 2024-03-18 MED ORDER — DOXYCYCLINE HYCLATE 100 MG PO TABS: 100.0000 mg | ORAL_TABLET | Freq: Two times a day (BID) | ORAL | 0 refills | Status: DC

## 2024-03-18 MED ORDER — DOXYCYCLINE HYCLATE 100 MG PO TABS
100.0000 mg | ORAL_TABLET | Freq: Once | ORAL | Status: AC
  Administered 2024-03-18: 100 mg via ORAL
  Filled 2024-03-18: qty 1

## 2024-03-18 NOTE — ED Notes (Signed)
 Pt presented to ED with c/o left breast pain and redness starting yesterday. States pain has resolved at this time. A&Ox4. Denies fevers, n/v, abd pain.

## 2024-03-18 NOTE — ED Triage Notes (Signed)
 To ER from home with report of 3 days of left arm pain and left breast redness and bump. Tonight at rest developed tachycardia. She bared down on the way here and tachycardiac resolved.   History of SVT and ablation.

## 2024-03-18 NOTE — ED Notes (Signed)
 Discoloration to left breast at 3 oclock position with a lump present. Negative mammogram in February. Denies any fevers or hot to touch.

## 2024-03-18 NOTE — ED Provider Notes (Signed)
 [facility] Provider Note    Event Date/Time   First MD Initiated Contact with Patient 03/18/24 [phone]     (approximate)   History   Arm Pain (Left) and Breast Pain (Left, redness)   HPI  Felicia Barnett is a 58 y.o. female with history of hypertension, diabetes, hyperlipidemia, SVT who presents to the emergency department with pain, redness and warmth to the left breast.  She feels like there is a mass underneath this area of redness.  No fevers.  She states prior to arrival she felt like her heart was racing.  She states she was able to perform vagal maneuvers and this resolved.  No vomiting, diarrhea.  Last mammogram was in February and was normal.  No prior history of breast cancer.  She does report recent travel to [place] with 4-hour flight in April.  No calf tenderness or calf swelling.  She denies prior history of PE, DVT.   History provided by patient, family.    Past Medical History:  Diagnosis Date   Anxiety    since surgery, pt has had attacks w/ fear of not being able to move her leg (anxiety attacks occur  3-4x month.)   Diabetes mellitus without complication (HCC)    pre-diabetes   H/O prior ablation treatment    Heart murmur    palpatiations   Hyperlipidemia    Hypertension    SVT (supraventricular tachycardia) (HCC)     Past Surgical History:  Procedure Laterality Date   ABDOMINAL HYSTERECTOMY     BREAST BIOPSY Right    neg,excisional bx   cardial ablation     anxiety attack (heart palpitations HR=210 bpm) the day she was released from the hospital from surgery. Findings were neg .   COLONOSCOPY     ESOPHAGOGASTRODUODENOSCOPY (EGD) WITH PROPOFOL  N/A 08/12/2020   Procedure: ESOPHAGOGASTRODUODENOSCOPY (EGD) WITH PROPOFOL ;  Surgeon: Selena Daily, MD;  Location: [facility] ENDOSCOPY;  Service: Gastroenterology;  Laterality: N/A;    MEDICATIONS:  Prior to Admission medications   Medication Sig Start Date End Date Taking?  Authorizing Provider  Ascorbic Acid (VITAMIN C) 100 MG tablet Take 100 mg by mouth daily.    [provider]  aspirin  81 MG chewable tablet Chew by mouth as needed.    [provider]  cholecalciferol (VITAMIN D) 25 MCG (1000 UT) tablet Take 1,000 Units by mouth daily.    [provider]  Cyanocobalamin (VITAMIN B 12 PO) Take 1 mg by mouth every other day.     [provider]  ezetimibe  (ZETIA ) 10 MG tablet Take 1 tablet (10 mg total) by mouth daily. 05/29/23 02/14/24  Gollan, Timothy J, MD  famotidine  (PEPCID ) 20 MG tablet Take 20 mg by mouth 2 (two) times daily. 10/09/20   [provider]  Magnesium  Citrate 200 MG TABS Take by mouth.    [provider]  nebivolol  (BYSTOLIC ) 2.5 MG tablet Take 2.5 mg by mouth in the morning, at noon, and at bedtime.    [provider]  NIFEdipine  (PROCARDIA -XL/NIFEDICAL-XL) 30 MG 24 hr tablet TAKE 1 TABLET BY MOUTH DAILY AS NEEDED (HIGH BLOOD PRESSURE). Patient not taking: Reported on 02/14/2024 07/05/23   Gollan, Timothy J, MD  Omega-3 1000 MG CAPS Take 2 g by mouth.    [provider]  omeprazole  (PRILOSEC) 40 MG capsule Take 1 capsule (40 mg total) by mouth 2 (two) times daily before a meal. 02/14/24   Vanga, Elson Halon, MD  Physical Exam   Triage Vital Signs: ED Triage Vitals  Encounter Vitals Group     BP 03/18/24 0107 (!) 173/106     Systolic BP Percentile --      Diastolic BP Percentile --      Pulse Rate 03/18/24 0107 71     Resp 03/18/24 0107 17     Temp 03/18/24 0107 98.8 F (37.1 C)     Temp Source 03/18/24 0107 Oral     SpO2 03/18/24 0107 100 %     Weight 03/18/24 0106 180 lb (81.6 kg)     Height 03/18/24 0106 5' (1.524 m)     Head Circumference --      Peak Flow --      Pain Score 03/18/24 0106 8     Pain Loc --      Pain Education --      Exclude from Growth Chart --     Most recent vital signs: Vitals:   03/18/24 0107 03/18/24 0444  BP: (!) 173/106 (!) 140/85   Pulse: 71 (!) 55  Resp: 17 18  Temp: 98.8 F (37.1 C) (!) 97.5 F (36.4 C)  SpO2: 100% 100%    CONSTITUTIONAL: Alert, responds appropriately to questions. Well-appearing; well-nourished HEAD: Normocephalic, atraumatic EYES: Conjunctivae clear, pupils appear equal, sclera nonicteric ENT: normal nose; moist mucous membranes NECK: Supple, normal ROM BREAST: Patient has a 4 cm by 4 cm area of redness and warmth to the left breast at the 3 o'clock position.  I do not appreciate any underlying mass, fluctuance, induration.  There is no nipple discharge.  No axillary lymphadenopathy. CARD: RRR; S1 and S2 appreciated RESP: Normal chest excursion without splinting or tachypnea; breath sounds clear and equal bilaterally; no wheezes, no rhonchi, no rales, no hypoxia or respiratory distress, speaking full sentences ABD/GI: Non-distended; soft, non-tender, no rebound, no guarding, no peritoneal signs BACK: The back appears normal EXT: Normal ROM in all joints; no deformity noted, no edema, no calf tenderness or calf swelling SKIN: Normal color for age and race; warm; no rash on exposed skin NEURO: Moves all extremities equally, normal speech PSYCH: The patient's mood and manner are appropriate.   ED Results / Procedures / Treatments   LABS: (all labs ordered are listed, but only abnormal results are displayed) Labs Reviewed  CBC WITH DIFFERENTIAL/PLATELET - Abnormal; Notable for the following components:      Result Value   Lymphs Abs 4.7 (*)    All other components within normal limits  COMPREHENSIVE METABOLIC PANEL WITH GFR - Abnormal; Notable for the following components:   Glucose, Bld 129 (*)    BUN 22 (*)    All other components within normal limits  TSH  MAGNESIUM   D-DIMER, QUANTITATIVE  TROPONIN I (HIGH SENSITIVITY)  TROPONIN I (HIGH SENSITIVITY)     EKG:  EKG Interpretation Date/Time:  Monday March 18 2024 01:10:49 EDT Ventricular Rate:  71 PR Interval:  146 QRS  Duration:  82 QT Interval:  370 QTC Calculation: 402 R Axis:   -21  Text Interpretation: Normal sinus rhythm Normal ECG When compared with ECG of 10-Oct-2023 00:51, No significant change was found Confirmed by Verneda Golder [phone]) on 03/18/2024 4:44:41 AM         RADIOLOGY: My personal review and interpretation of imaging: Ultrasound shows no abscess or other acute abnormality.  I have personally reviewed all radiology reports.   US  LIMITED ULTRASOUND INCLUDING AXILLA LEFT BREAST  Result Date: 03/18/2024 CLINICAL DATA:  Left breast redness and swelling, initial encounter EXAM: ULTRASOUND OF THE LEFT BREAST COMPARISON:  None available. IMPRESSION: Scanning in the area of clinical concern shows no focal fluid collection or mass lesion to correspond with the area of redness and pain. This likely represents focal cellulitis. Electronically Signed   By: Violeta Grey M.D.   On: 03/18/2024 02:28     PROCEDURES:  Critical Care performed: No     Procedures    IMPRESSION / MDM / ASSESSMENT AND PLAN / ED COURSE  I reviewed the triage vital signs and the nursing notes.    Patient here with area of cellulitis to the left breast.  Also had palpitations prior to arrival.  History of SVT.  The patient is on the cardiac monitor to evaluate for evidence of arrhythmia and/or significant heart rate changes.   DIFFERENTIAL DIAGNOSIS (includes but not limited to):   Cellulitis, abscess, breast mass, malignancy, anemia, electrolyte derangement, thyroid  dysfunction, SVT, PE, less likely ACS   Patient's presentation is most consistent with acute presentation with potential threat to life or bodily function.   PLAN: Workup initiated from triage.  No leukocytosis.  Normal electrolytes, creatinine.  First troponin negative.  Second troponin pending.  Will also obtain D-dimer given recent prolonged travel to rule out PE given racing heart tonight.  She is in a sinus rhythm currently.  Ultrasound  of the breast reviewed and interpreted by myself and the radiologist and shows no abscess, mass.  Did recommend that she follow-up with her PCP closely.  Will start her on Keflex and doxycycline for broad coverage for cellulitis.  Will give pain medication here.   MEDICATIONS GIVEN IN ED: Medications  oxyCODONE-acetaminophen  (PERCOCET/ROXICET) 5-325 MG per tablet 2 tablet (2 tablets Oral Not Given 03/18/24 0531)  ondansetron  (ZOFRAN -ODT) disintegrating tablet 4 mg (4 mg Oral Not Given 03/18/24 0535)  cephALEXin (KEFLEX) capsule 500 mg (500 mg Oral Given 03/18/24 0448)  doxycycline (VIBRA-TABS) tablet 100 mg (100 mg Oral Given 03/18/24 0448)     ED COURSE: Second troponin negative.  D-dimer negative.  TSH normal.  Patient remains hemodynamically stable.  She is comfortable plan for discharge home with antibiotics, pain medication and PCP follow-up.  At this time, I do not feel there is any life-threatening condition present. I reviewed all nursing notes, vitals, pertinent previous records.  All lab and urine results, EKGs, imaging ordered have been independently reviewed and interpreted by myself.  I reviewed all available radiology reports from any imaging ordered this visit.  Based on my assessment, I feel the patient is safe to be discharged home without further emergent workup and can continue workup as an outpatient as needed. Discussed all findings, treatment plan as well as usual and customary return precautions.  They verbalize understanding and are comfortable with this plan.  Outpatient follow-up has been provided as needed.  All questions have been answered.    CONSULTS: Admission considered but patient's workup is reassuring.  She does not meet septic criteria.  She is appropriate for outpatient management of cellulitis.   OUTSIDE RECORDS REVIEWED: Reviewed patient's last pulmonology and cardiology notes.       FINAL CLINICAL IMPRESSION(S) / ED DIAGNOSES   Final diagnoses:   Cellulitis of left breast  Palpitations     Rx / DC Orders   ED Discharge Orders          Ordered    doxycycline (VIBRA-TABS) 100 MG tablet  2 times daily  03/18/24 0523    cephALEXin (KEFLEX) 500 MG capsule  4 times daily        03/18/24 0523    oxyCODONE-acetaminophen  (PERCOCET) 5-325 MG tablet  Every 8 hours PRN        03/18/24 0523    ondansetron  (ZOFRAN -ODT) 4 MG disintegrating tablet  Every 6 hours PRN        03/18/24 0523    ibuprofen  (ADVIL ) 800 MG tablet  Every 8 hours PRN        03/18/24 0523             Note:  This document was prepared using Dragon voice recognition software and may include unintentional dictation errors.   Daanya Lanphier, Clover Dao, DO 03/18/24 [phone]

## 2024-03-18 NOTE — Discharge Instructions (Addendum)
 Your blood counts today were normal as were your electrolytes, cardiac labs and a blood test called a D-dimer was negative which rules out blood clots.  Ultrasound of your breast showed no acute abnormality.  I suspect that you have a soft tissue infection called cellulitis.  Please take your antibiotics until complete and follow-up closely with your doctor.

## 2024-03-31 NOTE — Progress Notes (Unsigned)
 Date:  04/01/2024   ID:  Felicia Barnett, DOB: 02/01/1966, MRN: [id number]  PCP:  [name] LABOR, MD   Chief Complaint  Patient presents with   [facility] ER     Patient was at [facility] ER with palpitations & cellulitis in left breast. Patient c/o chest discomfort with discomfort down her left arm and racing irregular heart beats.    HPI:  Felicia Barnett is a 58 y.o. female with a history of: Hypertension Diabetes Palpitations Hyperlipidemia  Anxiety about health Smoker, quit 58 yo OSA on CPAP Covid Jan 2022 Cardiac CTA August 2021 no coronary disease Chronic chest pain Who presents to the office today for dizziness, concern about her blood pressure  Last seen by myself in clinic 8/24 In the emergency room March 18, 2024 left arm pain breast pain  Seen in the ER January 31, 2024 abdominal pain  Seen in urgent care December 31, 2023 generalized bodyaches  ER December 2024 short of breath and chest tightness, high blood pressure  In follow-up today reports several issues: Tinnitus b/l of unclear etiology Right eye muscle fasciculations Left shoulder pain, musculoskeletal Atypical pain in left shoulder left chest when laying on her left side, chronic issue  CT scan abdomen pelvis pulled up and reviewed, very mild diffuse aortic atherosclerosis, no significant coronary calcification  No recent lipid panel available  EKG personally reviewed by myself on todays visit EKG Interpretation Date/Time:  Monday April 01 2024 09:57:42 EDT Ventricular Rate:  61 PR Interval:  150 QRS Duration:  80 QT Interval:  406 QTC Calculation: 408 R Axis:   -6  Text Interpretation: Normal sinus rhythm Possible Left atrial enlargement When compared with ECG of 18-Mar-2024 01:10, No significant change was found Confirmed by Perla Lye [phone]) on 04/01/2024 10:07:04 AM   Seen in the emergency room May 21, 2023 short of breath with palpitations Blood pressure 180/110, trended down in the ER ER physician  concerned about anxiety  Side effect on  Losartan: facial flushing Chlorthalidone: low potassium, dehydration Amlodipine : leg swelling, facial swelling, depression  In the ER 01/04/22 Reported having symptoms of dizziness chronic urinary frequency without dysuria that she attributes to her chlorthalidone. Feels the chlorthalidone was affecting her electrolytes BMP reviewed, normal numbers  Continues to use CPAP for sleep apnea  Cardiac CTA August 2021,  No coronary calcification, no coronary disease  Seen in the emergency room January 31, 2021 for various issues, diarrhea, palpitations  zio monitor  for significant arrhythmia, reviewed Rare short runs SVT/atrial tachycardia longest was 7 beatsSelect patient triggered events (2) associated with short runs of atrial tachycardia, most triggered events associated with normal sinus rhythm.  CT abdomen 2019 reviewed with minimal aortic atherosclerosis Prior CT chest 2017 with minimal coronary calcification  PMH:   has a past medical history of Anxiety, Diabetes mellitus without complication (HCC), H/O prior ablation treatment, Heart murmur, Hyperlipidemia, Hypertension, and SVT (supraventricular tachycardia) (HCC).  PSH:    Past Surgical History:  Procedure Laterality Date   ABDOMINAL HYSTERECTOMY     BREAST BIOPSY Right    neg,excisional bx   cardial ablation     anxiety attack (heart palpitations HR=210 bpm) the day she was released from the hospital from surgery. Findings were neg .   COLONOSCOPY     ESOPHAGOGASTRODUODENOSCOPY (EGD) WITH PROPOFOL  N/A 08/12/2020   Procedure: ESOPHAGOGASTRODUODENOSCOPY (EGD) WITH PROPOFOL ;  Surgeon: Unk Corinn Skiff, MD;  Location: [facility] ENDOSCOPY;  Service: Gastroenterology;  Laterality: N/A;    Current Outpatient  Medications  Medication Sig Dispense Refill   Ascorbic Acid (VITAMIN C) 100 MG tablet Take 100 mg by mouth daily.     aspirin  81 MG chewable tablet Chew by mouth as needed.      cholecalciferol (VITAMIN D) 25 MCG (1000 UT) tablet Take 1,000 Units by mouth daily.     Cyanocobalamin (VITAMIN B 12 PO) Take 1 mg by mouth every other day.      ezetimibe  (ZETIA ) 10 MG tablet Take 1 tablet (10 mg total) by mouth daily. 90 tablet 3   famotidine  (PEPCID ) 20 MG tablet Take 20 mg by mouth 2 (two) times daily.     ibuprofen  (ADVIL ) 800 MG tablet Take 1 tablet (800 mg total) by mouth every 8 (eight) hours as needed. 30 tablet 0   Magnesium  Citrate 200 MG TABS Take by mouth.     nebivolol  (BYSTOLIC ) 2.5 MG tablet Take 2.5 mg by mouth in the morning, at noon, and at bedtime.     Omega-3 1000 MG CAPS Take 2 g by mouth.     doxycycline  (VIBRA -TABS) 100 MG tablet Take 1 tablet (100 mg total) by mouth 2 (two) times daily. (Patient not taking: Reported on 04/01/2024) 14 tablet 0   NIFEdipine  (PROCARDIA -XL/NIFEDICAL-XL) 30 MG 24 hr tablet TAKE 1 TABLET BY MOUTH DAILY AS NEEDED (HIGH BLOOD PRESSURE). (Patient not taking: Reported on 04/01/2024) 90 tablet 1   omeprazole  (PRILOSEC) 40 MG capsule Take 1 capsule (40 mg total) by mouth 2 (two) times daily before a meal. (Patient not taking: Reported on 04/01/2024) 60 capsule 0   ondansetron  (ZOFRAN -ODT) 4 MG disintegrating tablet Take 1 tablet (4 mg total) by mouth every 6 (six) hours as needed for nausea or vomiting. (Patient not taking: Reported on 04/01/2024) 20 tablet 0   oxyCODONE -acetaminophen  (PERCOCET) 5-325 MG tablet Take 2 tablets by mouth every 8 (eight) hours as needed. (Patient not taking: Reported on 04/01/2024) 14 tablet 0   No current facility-administered medications for this visit.   Facility-Administered Medications Ordered in Other Visits  Medication Dose Route Frequency Provider Last Rate Last Admin   albuterol  (PROVENTIL ) (2.5 MG/3ML) 0.083% nebulizer solution 2.5 mg  2.5 mg Nebulization Once Cobb, Comer GAILS, NP       ALLERGIES:   Iodinated contrast media   SOCIAL HISTORY:  The patient  reports that she quit smoking about 15  years ago. Her smoking use included cigarettes. She started smoking about 35 years ago. She has a 10 pack-year smoking history. She has never used smokeless tobacco. She reports current alcohol use. She reports that she does not use drugs.   FAMILY HISTORY:   family history includes Diabetes in her father and mother; Heart attack in her father; Heart disease in her mother; Hypertension in her father and mother.    REVIEW OF SYSTEMS: Review of Systems  Constitutional: Negative.   HENT: Negative.    Eyes: Negative.   Respiratory: Negative.    Cardiovascular: Negative.  Negative for leg swelling.  Gastrointestinal: Negative.   Genitourinary: Negative.   Musculoskeletal: Negative.   Neurological: Negative.   Psychiatric/Behavioral: Negative.    All other systems reviewed and are negative.   PHYSICAL EXAM: VS:  BP (!) 140/60 (BP Location: Left Arm, Patient Position: Sitting, Cuff Size: Normal)   Pulse 61   Ht 5' (1.524 m)   Wt 182 lb 2 oz (82.6 kg)   SpO2 98%   BMI 35.57 kg/m  , BMI Body mass index is 35.57 kg/m. Constitutional:  oriented to person, place, and time. No distress.  HENT:  Head: Grossly normal Eyes:  no discharge. No scleral icterus.  Neck: No JVD, no carotid bruits  Cardiovascular: Regular rate and rhythm, no murmurs appreciated Pulmonary/Chest: Clear to auscultation bilaterally, no wheezes or rails Abdominal: Soft.  no distension.  no tenderness.  Musculoskeletal: Normal range of motion Neurological:  normal muscle tone. Coordination normal. No atrophy Skin: Skin warm and dry Psychiatric: normal affect, pleasant  RECENT LABS: 03/18/2024: ALT 20; BUN 22; Creatinine, Ser 0.70; Hemoglobin 13.0; Magnesium  2.0; Platelets 227; Potassium 4.9; Sodium 140; TSH 4.177    LIPID PANEL: Lab Results  Component Value Date   CHOL 236 (H) 10/23/2019   HDL 41 10/23/2019   LDLCALC 144 (H) 10/23/2019   TRIG 282 (H) 10/23/2019     WEIGHT: Wt Readings from Last 3 Encounters:   04/01/24 182 lb 2 oz (82.6 kg)  03/18/24 180 lb (81.6 kg)  02/14/24 181 lb 6 oz (82.3 kg)     ASSESSMENT AND PLAN:  Essential hypertension Symptoms controlled on current regimen of beta-blocker On bystolic  2.5 mg 3 times a day,   nifedipine   as needed if blood pressure runs high  Paroxysmal tachycardia Prior monitor with no significant arrhythmia May need to take with hydroxyzine  for anxiety Symptoms well-controlled on Bystolic  2.5 3 times daily  Insomnia History of sleep apnea on CPAP Exacerbated by weight gain  Atypical chest pain Cardiac CTA previously ordered, no coronary calcification, normal coronary arteries,  Recent CT scan April 2025 with no coronary calcification  Anxiety concerning health Reassurance provided Will defer management of anxiety to primary care  Leg swelling Reports plan to travel to [place], typically has significant leg swelling with traveling Likely secondary to dependent edema Recommended compression hose, she could take Lasix sparingly  Hyperlipidemia Given very mild diffuse aortic atherosclerosis on CT scan, she prefers aggressive cholesterol management Goal LDL less than 70, we will start Crestor  5 mg daily with slow titration upwards Repeat lipid panel today   Signed, Velinda Lunger, M.D., Ph.D. 04/01/2024  [facility] [place], [place] [phone]

## 2024-04-01 ENCOUNTER — Ambulatory Visit: Attending: Cardiovascular Disease | Admitting: Cardiovascular Disease

## 2024-04-01 VITALS — BP 140/60 | HR 61 | Ht 60.0 in | Wt 182.1 lb

## 2024-04-01 DIAGNOSIS — I10 Essential (primary) hypertension: Secondary | ICD-10-CM

## 2024-04-01 DIAGNOSIS — R002 Palpitations: Secondary | ICD-10-CM | POA: Diagnosis not present

## 2024-04-01 DIAGNOSIS — I479 Paroxysmal tachycardia, unspecified: Secondary | ICD-10-CM | POA: Diagnosis not present

## 2024-04-01 DIAGNOSIS — G4733 Obstructive sleep apnea (adult) (pediatric): Secondary | ICD-10-CM

## 2024-04-01 DIAGNOSIS — R079 Chest pain, unspecified: Secondary | ICD-10-CM

## 2024-04-01 DIAGNOSIS — E782 Mixed hyperlipidemia: Secondary | ICD-10-CM

## 2024-04-01 DIAGNOSIS — Z79899 Other long term (current) drug therapy: Secondary | ICD-10-CM

## 2024-04-01 MED ORDER — ROSUVASTATIN CALCIUM 5 MG PO TABS: 5.0000 mg | ORAL_TABLET | Freq: Every day | ORAL | 3 refills | Status: AC

## 2024-04-01 MED ORDER — FUROSEMIDE 20 MG PO TABS: 20.0000 mg | ORAL_TABLET | Freq: Every day | ORAL | 1 refills | Status: DC | PRN

## 2024-04-01 MED ORDER — EZETIMIBE 10 MG PO TABS: 10.0000 mg | ORAL_TABLET | Freq: Every day | ORAL | 3 refills | Status: AC

## 2024-04-01 NOTE — Patient Instructions (Addendum)
 Medication Instructions:   Lasix 20 as needed for leg swelling Please start crestor  5 mg daily  If you need a refill on your cardiac medications before your next appointment, please call your pharmacy.   Lab work: Lipids, B12, Vit D, A1C  Testing/Procedures: No new testing needed  Follow-Up: At [facility], you and your health needs are our priority.  As part of our continuing mission to provide you with exceptional heart care, we have created designated Provider Care Teams.  These Care Teams include your primary Cardiologist (physician) and Advanced Practice Providers (APPs -  Physician Assistants and Nurse Practitioners) who all work together to provide you with the care you need, when you need it.  You will need a follow up appointment in 12 months  Providers on your designated Care Team:   Lonni Meager, NP Bernardino Bring, PA-C Cadence [place], [place]  COVID-19 Vaccine Information can be found at: [email or web address] For questions related to vaccine distribution or appointments, please email [email or web address] or call [phone].

## 2024-04-02 ENCOUNTER — Other Ambulatory Visit: Payer: Self-pay | Admitting: Emergency Medicine

## 2024-04-02 ENCOUNTER — Ambulatory Visit: Payer: Self-pay | Admitting: Cardiovascular Disease

## 2024-04-02 DIAGNOSIS — E785 Hyperlipidemia, unspecified: Secondary | ICD-10-CM

## 2024-04-02 LAB — VITAMIN B12: Vitamin B-12: 271 pg/mL (ref 232–1245)

## 2024-04-02 LAB — LIPID PANEL
Chol/HDL Ratio: 5.8 ratio — ABNORMAL HIGH (ref 0.0–4.4)
Cholesterol, Total: 248 mg/dL — ABNORMAL HIGH (ref 100–199)
HDL: 43 mg/dL (ref >39)
LDL Chol Calc (NIH): 173 mg/dL — ABNORMAL HIGH (ref 0–99)
Triglycerides: 174 mg/dL — ABNORMAL HIGH (ref 0–149)
VLDL Cholesterol Cal: 32 mg/dL (ref 5–40)

## 2024-04-02 LAB — VITAMIN D 25 HYDROXY (VIT D DEFICIENCY, FRACTURES): Vit D, 25-Hydroxy: 27.7 ng/mL — ABNORMAL LOW (ref 30.0–100.0)

## 2024-04-02 LAB — HEMOGLOBIN A1C
Est. average glucose Bld gHb Est-mCnc: 114 mg/dL
Hgb A1c MFr Bld: 5.6 % (ref 4.8–5.6)

## 2024-04-08 ENCOUNTER — Encounter: Payer: Self-pay | Admitting: Nurse Practitioner

## 2024-04-08 ENCOUNTER — Ambulatory Visit: Admitting: Nurse Practitioner

## 2024-04-08 VITALS — BP 122/82 | HR 59 | Temp 97.1°F | Ht 60.0 in | Wt 179.8 lb

## 2024-04-08 DIAGNOSIS — G4733 Obstructive sleep apnea (adult) (pediatric): Secondary | ICD-10-CM | POA: Diagnosis not present

## 2024-04-08 DIAGNOSIS — R918 Other nonspecific abnormal finding of lung field: Secondary | ICD-10-CM | POA: Diagnosis not present

## 2024-04-08 DIAGNOSIS — Z87891 Personal history of nicotine dependence: Secondary | ICD-10-CM | POA: Diagnosis not present

## 2024-04-08 NOTE — Progress Notes (Signed)
 @Patient  ID: Felicia Barnett, female    DOB: 1966-02-28, 58 y.o.   MRN: [id number]  Chief Complaint  Patient presents with   Follow-up    CPAP compliance. Still feels tired. Wakes up and mouth is dry. Face mask is too tight.     Referring provider: [name] LABOR, MD  HPI: 58 year old female, former smoker followed for OSA on CPAP and lung nodules, considered benign.  Past medical history significant for OSA on CPAP, hypertension, anxiety, HLD.  TEST/EVENTS:  10/2016 PSG: AHI 9.5/h, SpO2 low 82% 06/04/2020 cardiac CT: Small pulmonary nodules in the left lung, largest 5 mm.  Statistically likely benign.  Hepatic steatosis.  Small hiatal hernia. 09/26/2022 CXR: No acute process.  Lungs are clear.  07/31/2023: OV with Jane Broughton NP for sleep consult referred by Dr. Lorel.  She has a history of mild sleep apnea, originally diagnosed in 2018.  She has been on CPAP since.  She sleeps well with it at night.  Does still have some daytime fatigue some days. She is due for a new CPAP. Without her CPAP, she has difficulties with waking up frequently.  She also would stop breathing when she slept at night.  She would also wake up choking.  No current issues with drowsy driving, morning headaches, sleep parasomnia/paralysis.  She does still have trouble falling asleep most nights..  She wanted to try melatonin but was worried that she should not do it because of her sleep apnea.  She has never had any prescription medications.  She goes to bed between 11 PM and 1 AM.  Some nights takes her a long time to fall asleep.  Has seemed to get worse with her going through menopause.  Use he can stay asleep once she falls asleep.  Gets up around 6:30 AM.  She has had 20 pound weight gain over the last 2 years.  Last sleep study was in 2018.  She is on CPAP 10 cm of water.  Does not wear any supplemental oxygen.  Wearing a fullface mask.  Having issues with mask fit and feeling like it is too tight since she has gained the  weight. She has a history of high blood pressure.  No history of stroke or diabetes.  She is a former smoker.  10-pack-year history.  Quit in 2010.  No excessive alcohol or caffeine intake.  Lives with her husband.  She works at [employer].  Does not operate any heavy machinery in her job Animal nutritionist.  No significant family history reported. She also been having some difficulties with shortness of breath over the past year or so.  She only notices it with exercise and dancing.  It did start after she had gained her 20 pounds.  She denies any cough, wheezing, chest congestion, leg swelling, orthopnea, PND. No hx of asthma or lung disease. No known exposures.  She does have a history of lung nodules that were incidentally found on cardiac CT in 2021.  Never had follow-up regarding these.  Largest was a 5 mm.  She is concerned because she does have a history of smoking.  Denies any hemoptysis, weight loss, anorexia, night sweats.  No issues with fevers or chills. [place] 4  04/08/2024: Today - follow up Discussed the use of AI scribe software for clinical note transcription with the patient, who gave verbal consent to proceed.  History of Present Illness   Felicia Barnett is a 58 year old female with sleep apnea who  presents with CPAP mask issues and dry mouth.  She experiences ongoing issues with her CPAP mask, specifically discomfort from the strap that's at the base of her head, and mouth dryness. She currently uses a full face mask but finds it uncomfortable, particularly at the back of her head, leading her to loosen it, which causes air leaks.  She uses her CPAP machine consistently, missing only one day due to illness. She does feel better rested with her CPAP. No issues with drowsy driving or sleep parasomnias/paralysis.   She plans to travel to [place] for fifteen days for her mother's open heart surgery and intends to take her CPAP machine with her.   She has a history of smoking, having  quit at age 89 after smoking from age 62. She smoked about five to six cigarettes a day, with rare increases during stressful periods. 10.5 pack year history. Last CT chest with stable, small nodules, considered benign. No cough, wheezing, hemoptysis, anorexia, weight loss.   She underwent a breathing test, which returned normal results. Still has some shortness of breath with more strenuous activities. Trying to work on activity.   She is working on weight loss. Her PCP prescribed Zepbound, which they are trying to get covered.   03/06/2024-04/04/2024: CPAP 10-15 cmH2O 28/30 days; 93% >4 hr; average use 5 hr 57 min Leaks 95th 9 Pressure 95th 14.5 AHI 1.9      Allergies  Allergen Reactions   Iodinated Contrast Media Hives    Pt was injected with 85mL of Omnipaque  350 and approximately 15 minutes after injection she developed 5-6 hives all over. She was given 50mg  PO Benadryl  and Dr. Walden, our Radiologist came and evaluated her. She was observed for 1 hour and released without complications. SPM Pt was injected with 85mL of Omnipaque  350 and approximately 15 minutes after injection she developed 5-6 hives all over. She was given 50mg  PO Benadryl  and Dr. Walden, our Radiologist came and evaluated her. She was observed for 1 hour and released without complications. SPM    Immunization History  Administered Date(s) Administered   Influenza-Unspecified 07/10/2013   PFIZER Comirnaty(Gray Top)Covid-19 Tri-Sucrose Vaccine 12/16/2019, 01/16/2020   PFIZER(Purple Top)SARS-COV-2 Vaccination 12/16/2019, 01/16/2020    Past Medical History:  Diagnosis Date   Anxiety    since surgery, pt has had attacks w/ fear of not being able to move her leg (anxiety attacks occur  3-4x month.)   Diabetes mellitus without complication (HCC)    pre-diabetes   H/O prior ablation treatment    Heart murmur    palpatiations   Hyperlipidemia    Hypertension    SVT (supraventricular tachycardia) (HCC)      Tobacco History: Social History   Tobacco Use  Smoking Status Former   Current packs/day: 0.00   Average packs/day: 0.3 packs/day for 42.0 years (10.5 ttl pk-yrs)   Types: Cigarettes   Start date: 03/02/1972   Quit date: 03/02/2014   Years since quitting: 10.1  Smokeless Tobacco Never   Counseling given: Not Answered   Outpatient Medications Prior to Visit  Medication Sig Dispense Refill   Ascorbic Acid (VITAMIN C) 100 MG tablet Take 100 mg by mouth daily.     aspirin  81 MG chewable tablet Chew by mouth as needed.     cholecalciferol (VITAMIN D ) 25 MCG (1000 UT) tablet Take 1,000 Units by mouth daily.     Cyanocobalamin (VITAMIN B 12 PO) Take 1 mg by mouth every other day.  ezetimibe  (ZETIA ) 10 MG tablet Take 1 tablet (10 mg total) by mouth daily. 90 tablet 3   famotidine  (PEPCID ) 20 MG tablet Take 20 mg by mouth 2 (two) times daily.     furosemide  (LASIX ) 20 MG tablet Take 1 tablet (20 mg total) by mouth daily as needed. 30 tablet 1   ibuprofen  (ADVIL ) 800 MG tablet Take 1 tablet (800 mg total) by mouth every 8 (eight) hours as needed. 30 tablet 0   Magnesium  Citrate 200 MG TABS Take by mouth.     nebivolol  (BYSTOLIC ) 2.5 MG tablet Take 2.5 mg by mouth in the morning, at noon, and at bedtime.     Omega-3 1000 MG CAPS Take 2 g by mouth.     rosuvastatin  (CRESTOR ) 5 MG tablet Take 1 tablet (5 mg total) by mouth daily. 90 tablet 3   doxycycline  (VIBRA -TABS) 100 MG tablet Take 1 tablet (100 mg total) by mouth 2 (two) times daily. (Patient not taking: Reported on 04/08/2024) 14 tablet 0   NIFEdipine  (PROCARDIA -XL/NIFEDICAL-XL) 30 MG 24 hr tablet TAKE 1 TABLET BY MOUTH DAILY AS NEEDED (HIGH BLOOD PRESSURE). (Patient not taking: Reported on 04/08/2024) 90 tablet 1   omeprazole  (PRILOSEC) 40 MG capsule Take 1 capsule (40 mg total) by mouth 2 (two) times daily before a meal. (Patient not taking: Reported on 04/08/2024) 60 capsule 0   ondansetron  (ZOFRAN -ODT) 4 MG disintegrating tablet  Take 1 tablet (4 mg total) by mouth every 6 (six) hours as needed for nausea or vomiting. (Patient not taking: Reported on 04/08/2024) 20 tablet 0   oxyCODONE -acetaminophen  (PERCOCET) 5-325 MG tablet Take 2 tablets by mouth every 8 (eight) hours as needed. (Patient not taking: Reported on 04/08/2024) 14 tablet 0   Facility-Administered Medications Prior to Visit  Medication Dose Route Frequency Provider Last Rate Last Admin   albuterol  (PROVENTIL ) (2.5 MG/3ML) 0.083% nebulizer solution 2.5 mg  2.5 mg Nebulization Once Kye Silverstein, Comer GAILS, NP         Review of Systems:   Constitutional: No night sweats, fevers, chills,  or lassitude. +fatigue (improved) HEENT: No headaches, difficulty swallowing, tooth/dental problems, or sore throat. No sneezing, itching, ear ache, nasal congestion, or post nasal drip CV:  No chest pain, orthopnea, PND, swelling in lower extremities, anasarca, dizziness, palpitations, syncope Resp: +shortness of breath with exertion (baseline). No excess mucus or change in color of mucus. No productive or non-productive. No hemoptysis. No wheezing.  No chest wall deformity GI:  No heartburn, indigestion GU: No  nocturia  Skin: No rash, lesions, ulcerations MSK:  No joint pain or swelling.   Neuro: No dizziness or lightheadedness.  Psych: No depression or anxiety. Mood stable.    Physical Exam:  BP 122/82 (BP Location: Right Arm, Cuff Size: Normal)   Pulse (!) 59   Temp (!) 97.1 F (36.2 C)   Ht 5' (1.524 m)   Wt 179 lb 12.8 oz (81.6 kg)   SpO2 97%   BMI 35.11 kg/m   GEN: Pleasant, interactive, well-appearing; obese; in no acute distress. HEENT:  Normocephalic and atraumatic. PERRLA. Sclera white. Nasal turbinates pink, moist and patent bilaterally. No rhinorrhea present. Oropharynx pink and moist, without exudate or edema. No lesions, ulcerations, or postnasal drip. Mallampati III NECK:  Supple w/ fair ROM. No lymphadenopathy.   CV: RRR, no m/r/g, no peripheral  edema. Pulses intact, +2 bilaterally. No cyanosis, pallor or clubbing. PULMONARY:  Unlabored, regular breathing. Clear bilaterally A&P w/o wheezes/rales/rhonchi. No accessory muscle use.  GI:  BS present and normoactive. Soft, non-tender to palpation. No organomegaly or masses detected.  MSK: No erythema, warmth or tenderness. Cap refil <2 sec all extrem. No deformities or joint swelling noted.  Neuro: A/Ox3. No focal deficits noted.   Skin: Warm, no lesions or rashe Psych: Normal affect and behavior. Judgement and thought content appropriate.     Lab Results:  CBC    Component Value Date/Time   WBC 9.2 03/18/2024 0113   RBC 4.38 03/18/2024 0113   HGB 13.0 03/18/2024 0113   HGB 12.6 06/14/2022 1533   HCT 39.1 03/18/2024 0113   HCT 36.3 06/14/2022 1533   PLT 227 03/18/2024 0113   PLT 220 06/14/2022 1533   MCV 89.3 03/18/2024 0113   MCV 86 06/14/2022 1533   MCV 93 10/08/2014 0241   MCH 29.7 03/18/2024 0113   MCHC 33.2 03/18/2024 0113   RDW 12.6 03/18/2024 0113   RDW 12.9 06/14/2022 1533   RDW 13.1 10/08/2014 0241   LYMPHSABS 4.7 (H) 03/18/2024 0113   LYMPHSABS 2.3 10/08/2013 1344   MONOABS 0.8 03/18/2024 0113   MONOABS 0.8 10/08/2013 1344   EOSABS 0.3 03/18/2024 0113   EOSABS 0.1 10/08/2013 1344   BASOSABS 0.0 03/18/2024 0113   BASOSABS 0.0 10/08/2013 1344    BMET    Component Value Date/Time   NA 140 03/18/2024 0113   NA 141 10/23/2019 1051   NA 139 10/08/2014 0241   K 4.9 03/18/2024 0113   K 3.8 10/08/2014 0241   CL 108 03/18/2024 0113   CL 105 10/08/2014 0241   CO2 24 03/18/2024 0113   CO2 25 10/08/2014 0241   GLUCOSE 129 (H) 03/18/2024 0113   GLUCOSE 98 10/08/2014 0241   BUN 22 (H) 03/18/2024 0113   BUN 10 10/23/2019 1051   BUN 15 10/08/2014 0241   CREATININE 0.70 03/18/2024 0113   CREATININE 0.76 10/08/2014 0241   CALCIUM  9.5 03/18/2024 0113   CALCIUM  8.8 10/08/2014 0241   GFRNONAA >60 03/18/2024 0113   GFRNONAA >60 10/08/2014 0241   GFRNONAA >60  04/15/2014 0027   GFRAA >60 05/05/2020 0112   GFRAA >60 10/08/2014 0241   GFRAA >60 04/15/2014 0027    BNP    Component Value Date/Time   BNP 239 (H) 10/07/2013 2149     Imaging:  US  LIMITED ULTRASOUND INCLUDING AXILLA LEFT BREAST  Result Date: 03/18/2024 CLINICAL DATA:  Left breast redness and swelling, initial encounter EXAM: ULTRASOUND OF THE LEFT BREAST COMPARISON:  None available. IMPRESSION: Scanning in the area of clinical concern shows no focal fluid collection or mass lesion to correspond with the area of redness and pain. This likely represents focal cellulitis. Electronically Signed   By: Oneil Devonshire M.D.   On: 03/18/2024 02:28    Administration History     None          Latest Ref Rng & Units 10/17/2023    3:48 PM  PFT Results  FVC-Pre L 2.44   FVC-Predicted Pre % 85   FVC-Post L 2.45   FVC-Predicted Post % 85   Pre FEV1/FVC % % 82   Post FEV1/FCV % % 86   FEV1-Pre L 1.99   FEV1-Predicted Pre % 90   FEV1-Post L 2.10   DLCO uncorrected ml/min/mmHg 16.87   DLCO UNC% % 95   DLVA Predicted % 91   TLC L 3.73   TLC % Predicted % 83   RV % Predicted % 72     No results found for:  NITRICOXIDE      Assessment & Plan:   OSA on CPAP Mild OSA on CPAP. Excellent compliance and control. Receives benefit from use. Having some difficulties with the headstrap. Provided her with sample of the AirFit P10 mask - she will obtain a chin strap to use with this. Felt this was comfortable. Adjusted tube temperature to 78 degrees and increased humidity to level 5. Aware of proper care/use of device. Minimal cardiovascular risks associated with mild OSA but receives benefit from use. Encouraged to continue nightly. Safe driving practices reviewed. Healthy weight loss encouraged.  Patient Instructions  Continue to use CPAP every night, minimum of 4-6 hours a night.  Change equipment as directed. Wash your tubing with warm soap and water daily, hang to dry. Wash humidifier  portion weekly. Use bottled, distilled water and change daily Be aware of reduced alertness and do not drive or operate heavy machinery if experiencing this or drowsiness.  Exercise encouraged, as tolerated. Healthy weight management discussed.  Avoid or decrease alcohol consumption and medications that make you more sleepy, if possible. Notify if persistent daytime sleepiness occurs even with consistent use of PAP therapy.  I adjusted your humidity settings  See how new mask works and get a chin strap off of [employer]   Follow up in 1 year with Katie Lesbia Ottaway,NP, or sooner, if needed    Lung nodules Small nodules, considered benign. No dedicated follow up necessary. 10.5 pack year hx; not a candidate for lung cancer screening program.     I spent 35 minutes of dedicated to the care of this patient on the date of this encounter to include pre-visit review of records, face-to-face time with the patient discussing conditions above, post visit ordering of testing, clinical documentation with the electronic health record, making appropriate referrals as documented, and communicating necessary findings to members of the patients care team.  Comer LULLA Rouleau, NP 04/08/2024  Pt aware and understands NP's role.

## 2024-04-08 NOTE — Assessment & Plan Note (Addendum)
 Mild OSA on CPAP. Excellent compliance and control. Receives benefit from use. Having some difficulties with the headstrap. Provided her with sample of the AirFit P10 mask - she will obtain a chin strap to use with this. Felt this was comfortable. Adjusted tube temperature to 78 degrees and increased humidity to level 5. Aware of proper care/use of device. Minimal cardiovascular risks associated with mild OSA but receives benefit from use. Encouraged to continue nightly. Safe driving practices reviewed. Healthy weight loss encouraged.  Patient Instructions  Continue to use CPAP every night, minimum of 4-6 hours a night.  Change equipment as directed. Wash your tubing with warm soap and water daily, hang to dry. Wash humidifier portion weekly. Use bottled, distilled water and change daily Be aware of reduced alertness and do not drive or operate heavy machinery if experiencing this or drowsiness.  Exercise encouraged, as tolerated. Healthy weight management discussed.  Avoid or decrease alcohol consumption and medications that make you more sleepy, if possible. Notify if persistent daytime sleepiness occurs even with consistent use of PAP therapy.  I adjusted your humidity settings  See how new mask works and get a chin strap off of [employer]   Follow up in 1 year with Felicia Azari Janssens,NP, or sooner, if needed

## 2024-04-08 NOTE — Assessment & Plan Note (Signed)
 Small nodules, considered benign. No dedicated follow up necessary. 10.5 pack year hx; not a candidate for lung cancer screening program.

## 2024-04-08 NOTE — Patient Instructions (Signed)
 Continue to use CPAP every night, minimum of 4-6 hours a night.  Change equipment as directed. Wash your tubing with warm soap and water daily, hang to dry. Wash humidifier portion weekly. Use bottled, distilled water and change daily Be aware of reduced alertness and do not drive or operate heavy machinery if experiencing this or drowsiness.  Exercise encouraged, as tolerated. Healthy weight management discussed.  Avoid or decrease alcohol consumption and medications that make you more sleepy, if possible. Notify if persistent daytime sleepiness occurs even with consistent use of PAP therapy.  I adjusted your humidity settings  See how new mask works and get a chin strap off of [employer]   Follow up in 1 year with Felicia Jerome Viglione,NP, or sooner, if needed

## 2024-04-23 ENCOUNTER — Other Ambulatory Visit: Payer: Self-pay | Admitting: Cardiovascular Disease

## 2024-06-03 ENCOUNTER — Ambulatory Visit
Admission: EM | Admit: 2024-06-03 | Discharge: 2024-06-03 | Disposition: A | Discharge status: Home / Self Care | Attending: Emergency Medicine | Admitting: Emergency Medicine

## 2024-06-03 ENCOUNTER — Encounter: Payer: Self-pay | Admitting: Emergency Medicine

## 2024-06-03 DIAGNOSIS — R350 Frequency of micturition: Secondary | ICD-10-CM | POA: Diagnosis present

## 2024-06-03 LAB — POCT URINE DIPSTICK
Bilirubin, UA: NEGATIVE
Glucose, UA: NEGATIVE mg/dL
Ketones, POC UA: NEGATIVE mg/dL
Nitrite, UA: NEGATIVE
POC PROTEIN,UA: 30 — AB
Spec Grav, UA: 1.01 (ref 1.010–1.025)
Urobilinogen, UA: 0.2 U/dL
pH, UA: 6 (ref 5.0–8.0)

## 2024-06-03 MED ORDER — VALACYCLOVIR HCL 500 MG PO TABS: 500.0000 mg | ORAL_TABLET | ORAL | 0 refills | Status: AC | PRN

## 2024-06-03 MED ORDER — CEPHALEXIN 500 MG PO CAPS: 500.0000 mg | ORAL_CAPSULE | Freq: Two times a day (BID) | ORAL | 0 refills | Status: AC

## 2024-06-03 NOTE — Discharge Instructions (Addendum)
 Your urinalysis shows Felicia Barnett blood cells and blood but that is this time does not show bacteria , your urine will be sent to the lab to determine exactly which bacteria is present, if any changes need to be made to your medications you will be notified  Begin use of cephalexin  twice daily for 5 days  You may use over-the-counter Azo to help minimize your symptoms until antibiotic removes bacteria, this medication will turn your urine orange  Increase your fluid intake through use of water  As always practice good hygiene, wiping front to back and avoidance of scented vaginal products to prevent further irritation  If symptoms continue to persist after use of medication or recur please follow-up with urgent care or your primary doctor as needed

## 2024-06-03 NOTE — ED Triage Notes (Signed)
 Patient reports painful urination and blood in urine that started yesterday. Rates pain 10/10

## 2024-06-04 NOTE — ED Provider Notes (Addendum)
 CAY RALPH PELT    CSN: [id number] Arrival date & time: 06/03/24  1902      History   Chief Complaint Chief Complaint  Patient presents with   Hematuria   Dysuria    HPI Felicia Barnett is a 58 y.o. female.   Patient presents for evaluation of urinary frequency, dysuria and hematuria beginning 1 day ago.  Associated lower abdominal pressure occurring intermittently.  Has not attempted treatment.  Denies fever, flank pain, vaginal discharge, itching or odor.  History of a hysterectomy.  Resting refill for Valtrex , history of herpes genitalia, unsure present outbreak.  Past Medical History:  Diagnosis Date   Anxiety    since surgery, pt has had attacks w/ fear of not being able to move her leg (anxiety attacks occur  3-4x month.)   Diabetes mellitus without complication (HCC)    pre-diabetes   H/O prior ablation treatment    Heart murmur    palpatiations   Hyperlipidemia    Hypertension    SVT (supraventricular tachycardia) (HCC)     Patient Active Problem List   Diagnosis Date Noted   OSA on CPAP 07/31/2023   DOE (dyspnea on exertion) 07/31/2023   Obesity (BMI 30-39.9) 07/31/2023   Lung nodules 07/31/2023   Insomnia 07/31/2023   History of Helicobacter pylori infection    Paroxysmal tachycardia (HCC) 12/24/2018   Chest pain 12/24/2018   Palpitations 12/24/2018   Essential hypertension 12/24/2018   Low potassium syndrome 12/24/2018   PVC's (premature ventricular contractions) 12/24/2018   Other fatigue 12/24/2018   Lower extremity neuropathy 08/07/2015   Paresthesia of left leg 07/14/2015   Anxiety 10/29/2013   Hyperlipidemia 10/29/2013    Past Surgical History:  Procedure Laterality Date   ABDOMINAL HYSTERECTOMY     BREAST BIOPSY Right    neg,excisional bx   cardial ablation     anxiety attack (heart palpitations HR=210 bpm) the day she was released from the hospital from surgery. Findings were neg .   COLONOSCOPY      ESOPHAGOGASTRODUODENOSCOPY (EGD) WITH PROPOFOL  N/A 08/12/2020   Procedure: ESOPHAGOGASTRODUODENOSCOPY (EGD) WITH PROPOFOL ;  Surgeon: Unk Corinn Skiff, MD;  Location: [facility] ENDOSCOPY;  Service: Gastroenterology;  Laterality: N/A;    OB History   No obstetric history on file.      Home Medications    Prior to Admission medications   Medication Sig Start Date End Date Taking? Authorizing Provider  cephALEXin  (KEFLEX ) 500 MG capsule Take 1 capsule (500 mg total) by mouth 2 (two) times daily for 5 days. 06/03/24 06/08/24 Yes Burlon Centrella, Shelba SAUNDERS, NP  Ascorbic Acid (VITAMIN C) 100 MG tablet Take 100 mg by mouth daily.    [provider]  aspirin  81 MG chewable tablet Chew by mouth as needed.    [provider]  cholecalciferol (VITAMIN D ) 25 MCG (1000 UT) tablet Take 1,000 Units by mouth daily.    [provider]  Cyanocobalamin (VITAMIN B 12 PO) Take 1 mg by mouth every other day.     [provider]  doxycycline  (VIBRA -TABS) 100 MG tablet Take 1 tablet (100 mg total) by mouth 2 (two) times daily. Patient not taking: Reported on 04/08/2024 03/18/24   Ward, Josette SAILOR, DO  ezetimibe  (ZETIA ) 10 MG tablet Take 1 tablet (10 mg total) by mouth daily. 04/01/24 06/30/24  Gollan, Timothy J, MD  famotidine  (PEPCID ) 20 MG tablet Take 20 mg by mouth 2 (two) times daily. 10/09/20   [provider]  furosemide  (LASIX ) 20  MG tablet TAKE 1 TABLET BY MOUTH EVERY DAY AS NEEDED 04/23/24   Gollan, Timothy J, MD  ibuprofen  (ADVIL ) 800 MG tablet Take 1 tablet (800 mg total) by mouth every 8 (eight) hours as needed. 03/18/24   Ward, Josette SAILOR, DO  Magnesium  Citrate 200 MG TABS Take by mouth.    [provider]  nebivolol  (BYSTOLIC ) 2.5 MG tablet Take 2.5 mg by mouth in the morning, at noon, and at bedtime.    [provider]  NIFEdipine  (PROCARDIA -XL/NIFEDICAL-XL) 30 MG 24 hr tablet TAKE 1 TABLET BY MOUTH DAILY AS NEEDED (HIGH BLOOD PRESSURE). Patient not taking:  Reported on 04/08/2024 07/05/23   Gollan, Timothy J, MD  Omega-3 1000 MG CAPS Take 2 g by mouth.    [provider]  omeprazole  (PRILOSEC) 40 MG capsule Take 1 capsule (40 mg total) by mouth 2 (two) times daily before a meal. Patient not taking: Reported on 04/08/2024 02/14/24   Unk Corinn Skiff, MD  ondansetron  (ZOFRAN -ODT) 4 MG disintegrating tablet Take 1 tablet (4 mg total) by mouth every 6 (six) hours as needed for nausea or vomiting. Patient not taking: Reported on 04/08/2024 03/18/24   Ward, Josette SAILOR, DO  oxyCODONE -acetaminophen  (PERCOCET) 5-325 MG tablet Take 2 tablets by mouth every 8 (eight) hours as needed. Patient not taking: Reported on 04/08/2024 03/18/24 03/18/25  Ward, Josette SAILOR, DO  rosuvastatin  (CRESTOR ) 5 MG tablet Take 1 tablet (5 mg total) by mouth daily. 04/01/24 06/30/24  Gollan, Timothy J, MD  valACYclovir  (VALTREX ) 500 MG tablet Take 1 tablet (500 mg total) by mouth as needed for up to 10 days. 06/03/24 06/13/24  Teresa Shelba SAUNDERS, NP    Family History Family History  Problem Relation Age of Onset   Heart disease Mother    Diabetes Mother    Hypertension Mother    Heart attack Father    Diabetes Father    Hypertension Father    Breast cancer Neg Hx     Social History Social History   Tobacco Use   Smoking status: Former    Current packs/day: 0.00    Average packs/day: 0.3 packs/day for 42.0 years (10.5 ttl pk-yrs)    Types: Cigarettes    Start date: 03/02/1972    Quit date: 03/02/2014    Years since quitting: 10.2   Smokeless tobacco: Never  Vaping Use   Vaping status: Never Used  Substance Use Topics   Alcohol use: Yes    Comment: occ   Drug use: No     Allergies   Iodinated contrast media   Review of Systems Review of Systems  Genitourinary:  Positive for dysuria and hematuria.     Physical Exam Triage Vital Signs ED Triage Vitals  Encounter Vitals Group     BP 06/03/24 1932 125/83     Girls Systolic BP Percentile --      Girls  Diastolic BP Percentile --      Boys Systolic BP Percentile --      Boys Diastolic BP Percentile --      Pulse Rate 06/03/24 1932 71     Resp 06/03/24 1932 20     Temp 06/03/24 1932 98 F (36.7 C)     Temp src --      SpO2 06/03/24 1932 100 %     Weight --      Height --      Head Circumference --      Peak Flow --  Pain Score 06/03/24 1935 10     Pain Loc --      Pain Education --      Exclude from Growth Chart --    No data found.  Updated Vital Signs BP 125/83 (BP Location: Left Arm)   Pulse 71   Temp 98 F (36.7 C)   Resp 20   SpO2 100%   Visual Acuity Right Eye Distance:   Left Eye Distance:   Bilateral Distance:    Right Eye Near:   Left Eye Near:    Bilateral Near:     Physical Exam Constitutional:      Appearance: Normal appearance.  Eyes:     Extraocular Movements: Extraocular movements intact.  Pulmonary:     Effort: Pulmonary effort is normal.  Abdominal:     Tenderness: There is no abdominal tenderness. There is no right CVA tenderness, left CVA tenderness or guarding.  Genitourinary:    Comments: deferred Neurological:     Mental Status: She is alert and oriented to person, place, and time. Mental status is at baseline.      UC Treatments / Results  Labs (all labs ordered are listed, but only abnormal results are displayed) Labs Reviewed  POCT URINE DIPSTICK - Abnormal; Notable for the following components:      Result Value   Clarity, UA cloudy (*)    Blood, UA large (*)    POC PROTEIN,UA =30 (*)    Leukocytes, UA Moderate (2+) (*)    All other components within normal limits  URINE CULTURE    EKG   Radiology No results found.  Procedures Procedures (including critical care time)  Medications Ordered in UC Medications - No data to display  Initial Impression / Assessment and Plan / UC Course  I have reviewed the triage vital signs and the nursing notes.  Pertinent labs & imaging results that were available during my  care of the patient were reviewed by me and considered in my medical decision making (see chart for details).  Urinary frequency  Urinalysis showing hemoglobin and leukocytes, negative for nitrates, sent for culture, discussed findings with patient empirically placed on cephalexin  based on prior culture results are Proteus Mirabella's and E. coli, I recommend over-the-counter medications and nonpharmacological supportive care and advised follow-up if symptoms persist worsen or recur Final Clinical Impressions(s) / UC Diagnoses   Final diagnoses:  Urinary frequency     Discharge Instructions      Your urinalysis shows Mitchelle Sultan blood cells and blood but that is this time does not show bacteria , your urine will be sent to the lab to determine exactly which bacteria is present, if any changes need to be made to your medications you will be notified  Begin use of cephalexin  twice daily for 5 days  You may use over-the-counter Azo to help minimize your symptoms until antibiotic removes bacteria, this medication will turn your urine orange  Increase your fluid intake through use of water  As always practice good hygiene, wiping front to back and avoidance of scented vaginal products to prevent further irritation  If symptoms continue to persist after use of medication or recur please follow-up with urgent care or your primary doctor as needed    ED Prescriptions     Medication Sig Dispense Auth. Provider   cephALEXin  (KEFLEX ) 500 MG capsule Take 1 capsule (500 mg total) by mouth 2 (two) times daily for 5 days. 10 capsule Tyhir Schwan R, NP   valACYclovir  (  VALTREX ) 500 MG tablet Take 1 tablet (500 mg total) by mouth as needed for up to 10 days. 10 tablet Sholonda Jobst R, NP      PDMP not reviewed this encounter.   Teresa Shelba SAUNDERS, NP 06/04/24 0821    Teresa Shelba SAUNDERS, NP 06/04/24 [phone]

## 2024-06-06 ENCOUNTER — Ambulatory Visit (HOSPITAL_COMMUNITY): Payer: Self-pay

## 2024-06-06 LAB — URINE CULTURE: Culture: 60000 — AB

## 2024-06-20 ENCOUNTER — Emergency Department

## 2024-06-20 ENCOUNTER — Other Ambulatory Visit: Payer: Self-pay

## 2024-06-20 ENCOUNTER — Emergency Department
Admission: EM | Admit: 2024-06-20 | Discharge: 2024-06-20 | Disposition: A | Discharge status: Home / Self Care | Attending: Emergency Medicine | Admitting: Emergency Medicine

## 2024-06-20 DIAGNOSIS — R079 Chest pain, unspecified: Secondary | ICD-10-CM | POA: Diagnosis present

## 2024-06-20 DIAGNOSIS — I10 Essential (primary) hypertension: Secondary | ICD-10-CM | POA: Diagnosis not present

## 2024-06-20 DIAGNOSIS — Z79899 Other long term (current) drug therapy: Secondary | ICD-10-CM | POA: Diagnosis not present

## 2024-06-20 DIAGNOSIS — Z7982 Long term (current) use of aspirin: Secondary | ICD-10-CM | POA: Insufficient documentation

## 2024-06-20 LAB — BASIC METABOLIC PANEL WITH GFR
Anion gap: 9 (ref 5–15)
BUN: 16 mg/dL (ref 6–20)
CO2: 24 mmol/L (ref 22–32)
Calcium: 8.9 mg/dL (ref 8.9–10.3)
Chloride: 105 mmol/L (ref 98–111)
Creatinine, Ser: 0.66 mg/dL (ref 0.44–1.00)
GFR, Estimated: >60 mL/min (ref >60)
Glucose, Bld: 125 mg/dL — ABNORMAL HIGH (ref 70–99)
Potassium: 4.2 mmol/L (ref 3.5–5.1)
Sodium: 138 mmol/L (ref 135–145)

## 2024-06-20 LAB — CBC
HCT: 36.4 % (ref 36.0–46.0)
Hemoglobin: 12.6 g/dL (ref 12.0–15.0)
MCH: 30.4 pg (ref 26.0–34.0)
MCHC: 34.6 g/dL (ref 30.0–36.0)
MCV: 87.7 fL (ref 80.0–100.0)
Platelets: 224 K/uL (ref 150–400)
RBC: 4.15 MIL/uL (ref 3.87–5.11)
RDW: 12.4 % (ref 11.5–15.5)
WBC: 5.6 K/uL (ref 4.0–10.5)
nRBC: 0 % (ref 0.0–0.2)

## 2024-06-20 LAB — D-DIMER, QUANTITATIVE: D-Dimer, Quant: 0.33 ug{FEU}/mL (ref 0.00–0.50)

## 2024-06-20 LAB — TROPONIN I (HIGH SENSITIVITY)
Troponin I (High Sensitivity): 7 ng/L (ref <18)
Troponin I (High Sensitivity): 7 ng/L (ref <18)

## 2024-06-20 LAB — RESP PANEL BY RT-PCR (RSV, FLU A&B, COVID)  RVPGX2
Influenza A by PCR: NEGATIVE
Influenza B by PCR: NEGATIVE
Resp Syncytial Virus by PCR: NEGATIVE
SARS Coronavirus 2 by RT PCR: NEGATIVE

## 2024-06-20 LAB — TSH: TSH: 4.122 u[IU]/mL (ref 0.350–4.500)

## 2024-06-20 LAB — MAGNESIUM: Magnesium: 2.1 mg/dL (ref 1.7–2.4)

## 2024-06-20 MED ORDER — PANTOPRAZOLE SODIUM 40 MG PO TBEC
40.0000 mg | DELAYED_RELEASE_TABLET | Freq: Once | ORAL | Status: AC
  Administered 2024-06-20: 40 mg via ORAL
  Filled 2024-06-20: qty 1

## 2024-06-20 MED ORDER — ALUM & MAG HYDROXIDE-SIMETH 200-200-20 MG/5ML PO SUSP
30.0000 mL | Freq: Once | ORAL | Status: AC
  Administered 2024-06-20: 30 mL via ORAL
  Filled 2024-06-20: qty 30

## 2024-06-20 MED ORDER — ACETAMINOPHEN 500 MG PO TABS
1000.0000 mg | ORAL_TABLET | Freq: Once | ORAL | Status: AC
Start: 2024-06-20 — End: 2024-06-20
  Administered 2024-06-20: 1000 mg via ORAL
  Filled 2024-06-20: qty 2

## 2024-06-20 NOTE — Discharge Instructions (Signed)
 You may alternate over the counter Tylenol 1000 mg every 6 hours as needed for pain, fever and Ibuprofen 800 mg every 6-8 hours as needed for pain, fever.  Please take Ibuprofen with food.  Do not take more than 4000 mg of Tylenol (acetaminophen) in a 24 hour period.

## 2024-06-20 NOTE — ED Provider Notes (Signed)
 [facility] Provider Note    Event Date/Time   First MD Initiated Contact with Patient 06/20/24 0036     (approximate)   History   Chest Pain   HPI  Felicia Barnett is a 58 y.o. female with history of palpitations, hypertension, hyperlipidemia, prediabetes who presents to the emergency department with left-sided burning chest pain.  Took Pepcid  at home without much relief.  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  No fevers, cough, congestion.  No shortness of breath, nausea, vomiting, diaphoresis or dizziness.  Occasional palpitations and took one of her beta-blockers prior to arrival.  No calf tenderness or calf swelling.  No history of PE or DVT.  No history of ACS.   History provided by patient.    Past Medical History:  Diagnosis Date   Anxiety    since surgery, pt has had attacks w/ fear of not being able to move her leg (anxiety attacks occur  3-4x month.)   Diabetes mellitus without complication (HCC)    pre-diabetes   H/O prior ablation treatment    Heart murmur    palpatiations   Hyperlipidemia    Hypertension    SVT (supraventricular tachycardia) (HCC)     Past Surgical History:  Procedure Laterality Date   ABDOMINAL HYSTERECTOMY     BREAST BIOPSY Right    neg,excisional bx   cardial ablation     anxiety attack (heart palpitations HR=210 bpm) the day she was released from the hospital from surgery. Findings were neg .   COLONOSCOPY     ESOPHAGOGASTRODUODENOSCOPY (EGD) WITH PROPOFOL  N/A 08/12/2020   Procedure: ESOPHAGOGASTRODUODENOSCOPY (EGD) WITH PROPOFOL ;  Surgeon: Unk Corinn Skiff, MD;  Location: [facility] ENDOSCOPY;  Service: Gastroenterology;  Laterality: N/A;    MEDICATIONS:  Prior to Admission medications   Medication Sig Start Date End Date Taking? Authorizing Provider  Ascorbic Acid (VITAMIN C) 100 MG tablet Take 100 mg by mouth daily.     [provider]  aspirin  81 MG chewable tablet Chew by mouth as needed.    [provider]  cholecalciferol (VITAMIN D ) 25 MCG (1000 UT) tablet Take 1,000 Units by mouth daily.    [provider]  Cyanocobalamin (VITAMIN B 12 PO) Take 1 mg by mouth every other day.     [provider]  doxycycline  (VIBRA -TABS) 100 MG tablet Take 1 tablet (100 mg total) by mouth 2 (two) times daily. Patient not taking: Reported on 04/08/2024 03/18/24   Kendalyn Cranfield, Josette SAILOR, DO  ezetimibe  (ZETIA ) 10 MG tablet Take 1 tablet (10 mg total) by mouth daily. 04/01/24 06/30/24  Gollan, Timothy J, MD  famotidine  (PEPCID ) 20 MG tablet Take 20 mg by mouth 2 (two) times daily. 10/09/20   [provider]  furosemide  (LASIX ) 20 MG tablet TAKE 1 TABLET BY MOUTH EVERY DAY AS NEEDED 04/23/24   Gollan, Timothy J, MD  ibuprofen  (ADVIL ) 800 MG tablet Take 1 tablet (800 mg total) by mouth every 8 (eight) hours as needed. 03/18/24   Kimiye Strathman, Josette SAILOR, DO  Magnesium  Citrate 200 MG TABS Take by mouth.    [provider]  nebivolol  (BYSTOLIC ) 2.5 MG tablet Take 2.5 mg by mouth in the morning, at noon, and at bedtime.    [provider]  NIFEdipine  (PROCARDIA -XL/NIFEDICAL-XL) 30 MG 24 hr tablet TAKE 1 TABLET BY MOUTH DAILY AS NEEDED (HIGH BLOOD PRESSURE). Patient not taking: Reported on  04/08/2024 07/05/23   Gollan, Timothy J, MD  Omega-3 1000 MG CAPS Take 2 g by mouth.    [provider]  omeprazole  (PRILOSEC) 40 MG capsule Take 1 capsule (40 mg total) by mouth 2 (two) times daily before a meal. Patient not taking: Reported on 04/08/2024 02/14/24   Unk Corinn Skiff, MD  ondansetron  (ZOFRAN -ODT) 4 MG disintegrating tablet Take 1 tablet (4 mg total) by mouth every 6 (six) hours as needed for nausea or vomiting. Patient not taking: Reported on 04/08/2024 03/18/24   Deleah Tison, Josette SAILOR, DO  oxyCODONE -acetaminophen  (PERCOCET) 5-325 MG tablet Take 2 tablets by mouth every 8 (eight) hours as  needed. Patient not taking: Reported on 04/08/2024 03/18/24 03/18/25  Harla Mensch, Josette SAILOR, DO  rosuvastatin  (CRESTOR ) 5 MG tablet Take 1 tablet (5 mg total) by mouth daily. 04/01/24 06/30/24  Perla Evalene PARAS, MD    Physical Exam   Triage Vital Signs: ED Triage Vitals  Encounter Vitals Group     BP 06/20/24 0010 (!) 177/90     Girls Systolic BP Percentile --      Girls Diastolic BP Percentile --      Boys Systolic BP Percentile --      Boys Diastolic BP Percentile --      Pulse Rate 06/20/24 0010 67     Resp 06/20/24 0010 18     Temp 06/20/24 0010 97.7 F (36.5 C)     Temp Source 06/20/24 0010 Oral     SpO2 06/20/24 0010 99 %     Weight 06/20/24 0009 180 lb (81.6 kg)     Height 06/20/24 0009 5' (1.524 m)     Head Circumference --      Peak Flow --      Pain Score 06/20/24 0009 6     Pain Loc --      Pain Education --      Exclude from Growth Chart --     Most recent vital signs: Vitals:   06/20/24 0225 06/20/24 0230  BP:  115/78  Pulse:  (!) 56  Resp:    Temp:    SpO2: 99%     CONSTITUTIONAL: Alert, responds appropriately to questions. Well-appearing; well-nourished HEAD: Normocephalic, atraumatic EYES: Conjunctivae clear, pupils appear equal, sclera nonicteric ENT: normal nose; moist mucous membranes NECK: Supple, normal ROM, no meningismus CARD: RRR; S1 and S2 appreciated RESP: Normal chest excursion without splinting or tachypnea; breath sounds clear and equal bilaterally; no wheezes, no rhonchi, no rales, no hypoxia or respiratory distress, speaking full sentences ABD/GI: Non-distended; soft, non-tender, no rebound, no guarding, no peritoneal signs BACK: The back appears normal EXT: Normal ROM in all joints; no deformity noted, no edema, no calf tenderness or calf swelling SKIN: Normal color for age and race; warm; no rash on exposed skin NEURO: Moves all extremities equally, normal speech, normal gait, no facial asymmetry PSYCH: The patient's mood and manner are  appropriate.   ED Results / Procedures / Treatments   LABS: (all labs ordered are listed, but only abnormal results are displayed) Labs Reviewed  BASIC METABOLIC PANEL WITH GFR - Abnormal; Notable for the following components:      Result Value   Glucose, Bld 125 (*)    All other components within normal limits  RESP PANEL BY RT-PCR (RSV, FLU A&B, COVID)  [id number]  CBC  D-DIMER, QUANTITATIVE  TSH  MAGNESIUM   TROPONIN I (HIGH SENSITIVITY)  TROPONIN I (HIGH SENSITIVITY)     EKG:  EKG Interpretation  Date/Time:  Thursday June 20 2024 00:12:35 EDT Ventricular Rate:  60 PR Interval:  156 QRS Duration:  80 QT Interval:  398 QTC Calculation: 398 R Axis:   -10  Text Interpretation: Normal sinus rhythm Normal ECG When compared with ECG of 01-Apr-2024 09:57, No significant change was found Confirmed by Neomi Neptune [phone]) on 06/20/2024 12:58:37 AM         RADIOLOGY: My personal review and interpretation of imaging: Chest x-ray clear.  I have personally reviewed all radiology reports.   DG Chest 2 View Result Date: 06/20/2024 EXAM: 2 VIEW(S) XRAY OF THE CHEST 06/20/2024 12:48:00 AM COMPARISON: 08/01/2023 CLINICAL HISTORY: The patient states left sided chest pain that radiates into the left arm that started tonight. History of diabetes, hypertension, cardiac ablation, and former smoker. FINDINGS: LUNGS AND PLEURA: No focal pulmonary opacity. No pulmonary edema. No pleural effusion. No pneumothorax. HEART AND MEDIASTINUM: No acute abnormality of the cardiac and mediastinal silhouettes. BONES AND SOFT TISSUES: No acute osseous abnormality. IMPRESSION: 1. No acute process. Electronically signed by: Norman Gatlin MD 06/20/2024 12:54 AM EDT RP Workstation: [id number]     PROCEDURES:  Critical Care performed: No      Procedures    IMPRESSION / MDM / ASSESSMENT AND PLAN / ED COURSE  I reviewed the triage vital signs and the nursing notes.    Patient here with chest  pain, headache.  The patient is on the cardiac monitor to evaluate for evidence of arrhythmia and/or significant heart rate changes.   DIFFERENTIAL DIAGNOSIS (includes but not limited to):   ACS, acid reflux, indigestion, esophageal spasm, esophagitis, anemia, electrolyte derangement, pneumonia, viral URI, CHF, PE, doubt dissection, stroke, intracranial hemorrhage, esophageal perforation or rupture   Patient's presentation is most consistent with acute presentation with potential threat to life or bodily function.   PLAN: Will obtain cardiac labs.  Patient request Tylenol  for pain.  EKG nonischemic.  Chest x-ray pending.   MEDICATIONS GIVEN IN ED: Medications  acetaminophen  (TYLENOL ) tablet 1,000 mg (1,000 mg Oral Given 06/20/24 0107)  pantoprazole  (PROTONIX ) EC tablet 40 mg (40 mg Oral Given 06/20/24 0251)  alum & mag hydroxide-simeth (MAALOX/MYLANTA) 200-200-20 MG/5ML suspension 30 mL (30 mLs Oral Given 06/20/24 0250)     ED COURSE: Troponin x 2 negative.  Normal hemoglobin, electrolytes.  Negative D-dimer.  Minimal improvement with Tylenol .  Will give Protonix , Mylanta.  Chest x-ray reviewed and interpreted by myself and the radiologist and is clear.  COVID, flu and RSV negative.  Suspect pain seems to be more GI in nature.  Recommended bland diet, continued antacids as needed.  I feel she is safe for discharge.  She is comfortable with this plan.  Provided with work note.   At this time, I do not feel there is any life-threatening condition present. I reviewed all nursing notes, vitals, pertinent previous records.  All lab and urine results, EKGs, imaging ordered have been independently reviewed and interpreted by myself.  I reviewed all available radiology reports from any imaging ordered this visit.  Based on my assessment, I feel the patient is safe to be discharged home without further emergent workup and can continue workup as an outpatient as needed. Discussed all findings, treatment  plan as well as usual and customary return precautions.  They verbalize understanding and are comfortable with this plan.  Outpatient follow-up has been provided as needed.  All questions have been answered.    CONSULTS:  none   OUTSIDE RECORDS REVIEWED: Reviewed recent cardiology  notes in June 2025.    Echo May 2023:  IMPRESSIONS     1. Left ventricular ejection fraction, by estimation, is 60 to 65%. The  left ventricle has normal function. The left ventricle has no regional  wall motion abnormalities. Left ventricular diastolic parameters were  normal.   2. Right ventricular systolic function is normal. The right ventricular  size is normal. There is normal pulmonary artery systolic pressure. The  estimated right ventricular systolic pressure is 35.7 mmHg.   3. The mitral valve is normal in structure. Mild mitral valve  regurgitation. No evidence of mitral stenosis.   4. The aortic valve was not well visualized. Aortic valve regurgitation  is not visualized. No aortic stenosis is present.   5. The inferior vena cava is normal in size with greater than 50%  respiratory variability, suggesting right atrial pressure of 3 mmHg.     FINAL CLINICAL IMPRESSION(S) / ED DIAGNOSES   Final diagnoses:  Nonspecific chest pain     Rx / DC Orders   ED Discharge Orders     None        Note:  This document was prepared using Dragon voice recognition software and may include unintentional dictation errors.   Avishai Reihl, Josette SAILOR, DO 06/20/24 [phone]

## 2024-06-20 NOTE — ED Triage Notes (Signed)
 Pt reports left side chest pain that radiates into her left arm that began earlier tonight. Pt repots hx palpitations, takes nebivolol  and took a dose PTA.

## 2024-06-20 NOTE — ED Notes (Signed)
 This tech collected 2nd troponin.

## 2024-06-20 NOTE — ED Notes (Signed)
 Called lab about add on collections

## 2024-07-11 ENCOUNTER — Encounter: Payer: Self-pay | Admitting: Emergency Medicine

## 2024-09-29 ENCOUNTER — Observation Stay: Admit: 2024-09-29 | Discharge: 2024-09-29 | Disposition: A | Attending: Internal Medicine

## 2024-09-29 ENCOUNTER — Emergency Department

## 2024-09-29 ENCOUNTER — Observation Stay

## 2024-09-29 ENCOUNTER — Observation Stay
Admission: EM | Admit: 2024-09-29 | Discharge: 2024-09-29 | Disposition: A | Discharge status: Home / Self Care | Attending: Hospitalist | Admitting: Hospitalist

## 2024-09-29 DIAGNOSIS — R202 Paresthesia of skin: Principal | ICD-10-CM | POA: Insufficient documentation

## 2024-09-29 DIAGNOSIS — Z6835 Body mass index (BMI) 35.0-35.9, adult: Secondary | ICD-10-CM | POA: Insufficient documentation

## 2024-09-29 DIAGNOSIS — G4733 Obstructive sleep apnea (adult) (pediatric): Secondary | ICD-10-CM | POA: Diagnosis not present

## 2024-09-29 DIAGNOSIS — Z79899 Other long term (current) drug therapy: Secondary | ICD-10-CM | POA: Diagnosis not present

## 2024-09-29 DIAGNOSIS — G8192 Hemiplegia, unspecified affecting left dominant side: Secondary | ICD-10-CM | POA: Insufficient documentation

## 2024-09-29 DIAGNOSIS — E669 Obesity, unspecified: Secondary | ICD-10-CM | POA: Diagnosis not present

## 2024-09-29 DIAGNOSIS — R079 Chest pain, unspecified: Secondary | ICD-10-CM | POA: Diagnosis not present

## 2024-09-29 DIAGNOSIS — E785 Hyperlipidemia, unspecified: Secondary | ICD-10-CM | POA: Insufficient documentation

## 2024-09-29 DIAGNOSIS — I10 Essential (primary) hypertension: Secondary | ICD-10-CM

## 2024-09-29 DIAGNOSIS — Z7982 Long term (current) use of aspirin: Secondary | ICD-10-CM | POA: Insufficient documentation

## 2024-09-29 DIAGNOSIS — I081 Rheumatic disorders of both mitral and tricuspid valves: Secondary | ICD-10-CM | POA: Diagnosis not present

## 2024-09-29 DIAGNOSIS — R2990 Unspecified symptoms and signs involving the nervous system: Secondary | ICD-10-CM

## 2024-09-29 LAB — DIFFERENTIAL
Abs Immature Granulocytes: 0.02 K/uL (ref 0.00–0.07)
Basophils Absolute: 0 K/uL (ref 0.0–0.1)
Basophils Relative: 0 %
Eosinophils Absolute: 0.2 K/uL (ref 0.0–0.5)
Eosinophils Relative: 3 %
Immature Granulocytes: 0 %
Lymphocytes Relative: 46 %
Lymphs Abs: 2.7 K/uL (ref 0.7–4.0)
Monocytes Absolute: 0.6 K/uL (ref 0.1–1.0)
Monocytes Relative: 10 %
Neutro Abs: 2.4 K/uL (ref 1.7–7.7)
Neutrophils Relative %: 41 %

## 2024-09-29 LAB — CBG MONITORING, ED: Glucose-Capillary: 124 mg/dL — ABNORMAL HIGH (ref 70–99)

## 2024-09-29 LAB — COMPREHENSIVE METABOLIC PANEL WITH GFR
ALT: 27 U/L (ref 0–44)
AST: 20 U/L (ref 15–41)
Albumin: 4.5 g/dL (ref 3.5–5.0)
Alkaline Phosphatase: 67 U/L (ref 38–126)
Anion gap: 12 (ref 5–15)
BUN: 13 mg/dL (ref 6–20)
CO2: 23 mmol/L (ref 22–32)
Calcium: 8.9 mg/dL (ref 8.9–10.3)
Chloride: 106 mmol/L (ref 98–111)
Creatinine, Ser: 0.62 mg/dL (ref 0.44–1.00)
GFR, Estimated: >60 mL/min
Glucose, Bld: 120 mg/dL — ABNORMAL HIGH (ref 70–99)
Potassium: 3.8 mmol/L (ref 3.5–5.1)
Sodium: 140 mmol/L (ref 135–145)
Total Bilirubin: 0.2 mg/dL (ref 0.0–1.2)
Total Protein: 7.2 g/dL (ref 6.5–8.1)

## 2024-09-29 LAB — GLUCOSE, CAPILLARY: Glucose-Capillary: 108 mg/dL — ABNORMAL HIGH (ref 70–99)

## 2024-09-29 LAB — CBC
HCT: 36.6 % (ref 36.0–46.0)
Hemoglobin: 12.5 g/dL (ref 12.0–15.0)
MCH: 30 pg (ref 26.0–34.0)
MCHC: 34.2 g/dL (ref 30.0–36.0)
MCV: 87.8 fL (ref 80.0–100.0)
Platelets: 215 K/uL (ref 150–400)
RBC: 4.17 MIL/uL (ref 3.87–5.11)
RDW: 12.4 % (ref 11.5–15.5)
WBC: 5.8 K/uL (ref 4.0–10.5)
nRBC: 0 % (ref 0.0–0.2)

## 2024-09-29 LAB — ECHOCARDIOGRAM COMPLETE BUBBLE STUDY
AR max vel: 2.14 cm2
AV Peak grad: 7.7 mmHg
Ao pk vel: 1.39 m/s
Area-P 1/2: 4.89 cm2
Height: 60 in
S' Lateral: 2.5 cm
Weight: 2880 [oz_av]

## 2024-09-29 LAB — APTT: aPTT: 32 s (ref 24–36)

## 2024-09-29 LAB — ETHANOL: Alcohol, Ethyl (B): <15 mg/dL

## 2024-09-29 LAB — LIPID PANEL
Cholesterol: 261 mg/dL — ABNORMAL HIGH (ref 0–200)
HDL: 44 mg/dL
LDL Cholesterol: 192 mg/dL — ABNORMAL HIGH (ref 0–99)
Total CHOL/HDL Ratio: 5.9 ratio
Triglycerides: 126 mg/dL
VLDL: 25 mg/dL (ref 0–40)

## 2024-09-29 LAB — PROTIME-INR
INR: 0.9 (ref 0.8–1.2)
Prothrombin Time: 13 s (ref 11.4–15.2)

## 2024-09-29 LAB — HEMOGLOBIN A1C
Hgb A1c MFr Bld: 5.6 % (ref 4.8–5.6)
Mean Plasma Glucose: 114.02 mg/dL

## 2024-09-29 LAB — HIV ANTIBODY (ROUTINE TESTING W REFLEX): HIV Screen 4th Generation wRfx: NONREACTIVE

## 2024-09-29 MED ORDER — POLYETHYLENE GLYCOL 3350 17 G PO PACK: 17.0000 g | PACK | Freq: Every day | ORAL | Status: DC | PRN

## 2024-09-29 MED ORDER — LACTATED RINGERS IV SOLN: INTRAVENOUS | Status: DC

## 2024-09-29 MED ORDER — MELATONIN 5 MG PO TABS: 5.0000 mg | ORAL_TABLET | Freq: Every evening | ORAL | Status: DC | PRN

## 2024-09-29 MED ORDER — ASPIRIN 81 MG PO CHEW
324.0000 mg | CHEWABLE_TABLET | Freq: Once | ORAL | Status: AC
  Administered 2024-09-29: 324 mg via ORAL
  Filled 2024-09-29: qty 4

## 2024-09-29 MED ORDER — SODIUM CHLORIDE 0.9% FLUSH: 3.0000 mL | Freq: Once | INTRAVENOUS | Status: DC

## 2024-09-29 MED ORDER — NEBIVOLOL HCL 2.5 MG PO TABS
2.5000 mg | ORAL_TABLET | Freq: Three times a day (TID) | ORAL | Status: DC
  Filled 2024-09-29 (×2): qty 1

## 2024-09-29 MED ORDER — ACETAMINOPHEN 325 MG PO TABS: 650.0000 mg | ORAL_TABLET | Freq: Four times a day (QID) | ORAL | Status: DC | PRN

## 2024-09-29 MED ORDER — ROSUVASTATIN CALCIUM 10 MG PO TABS
5.0000 mg | ORAL_TABLET | Freq: Every day | ORAL | Status: DC
  Administered 2024-09-29: 5 mg via ORAL
  Filled 2024-09-29 (×2): qty 1

## 2024-09-29 MED ORDER — ENOXAPARIN SODIUM 40 MG/0.4ML IJ SOSY
0.5000 mg/kg | PREFILLED_SYRINGE | INTRAMUSCULAR | Status: DC
  Filled 2024-09-29: qty 0.4

## 2024-09-29 MED ORDER — ASPIRIN 81 MG PO TBEC
81.0000 mg | DELAYED_RELEASE_TABLET | Freq: Every day | ORAL | Status: DC
  Filled 2024-09-29: qty 1

## 2024-09-29 MED ORDER — PROCHLORPERAZINE EDISYLATE 10 MG/2ML IJ SOLN: 5.0000 mg | Freq: Four times a day (QID) | INTRAMUSCULAR | Status: DC | PRN

## 2024-09-29 MED ORDER — LABETALOL HCL 5 MG/ML IV SOLN: 5.0000 mg | INTRAVENOUS | Status: DC | PRN

## 2024-09-29 MED ORDER — EZETIMIBE 10 MG PO TABS
10.0000 mg | ORAL_TABLET | Freq: Every day | ORAL | Status: DC
  Administered 2024-09-29: 10 mg via ORAL
  Filled 2024-09-29: qty 1

## 2024-09-29 MED ORDER — STROKE: EARLY STAGES OF RECOVERY BOOK: Freq: Once | Status: DC

## 2024-09-29 NOTE — Progress Notes (Signed)
 Patient has refused Lovenox , MD made aware

## 2024-09-29 NOTE — Progress Notes (Signed)
 Echocardiogram 2D Echocardiogram has been performed.  Felicia Barnett N Christen Wardrop,RDCS 09/29/2024, 1:50 PM

## 2024-09-29 NOTE — Consult Note (Signed)
 [facility] Consult Services   Patient Name:   Felicia Barnett, Felicia Barnett Date of Birth:   [birth year] Identification Number:   MRN - [id number] Date of Service:   09/29/2024 02:18:34  Diagnosis:       R42 - Dizziness/ Vertigo/ Giddiness       R20.2 - Paresthesia of skin  Impression:      Patient presents to the ED for evaluation of headache, dizziness and left upper extremity tingling/paresthesias. On my exam, patient had clear speech, symmetric smile, normal visual acuity, equal strength and sensation in her bilateral upper and lower extremities and no ataxia or dysmetria. Her gait was normal, and she endorsed resolution of her dizziness and her sensory abnormalities in her left hand. Her CT head demonstrated no acute abnormalities. Patient was not a candidate for IV thrombolytics due to resolution of symptoms. Given patient's dizziness and unilateral sensory changes, there is concern for TIA and I would recommend initiating aspirin  therapy and admit for MRI brain.  Our recommendations are outlined below.  Recommendations:        Stroke/Telemetry Floor       Neuro Checks (Q4)       Bedside Swallow Eval       DVT Prophylaxis       Antihypertensives PRN if Blood pressure is greater than 220/120 or there is a concern for End organ damage/contraindications for permissive HTN. If blood pressure is greater than 220/120 give labetalol  PO or IV or Vasotec IV with a goal of 15% reduction in BP during the first 24 hours.       Start ASA 81 mg daily       Obtain MRI brain without contrast       Check lipid profile, A1c       Obtain TTE, monitor on telemetry  Sign Out:       Discussed with Emergency Department Provider    ------------------------------------------------------------------------------  Advanced Imaging: Advanced Imaging Deferred because:  Non-disabling symptoms as verified by the patient; no cortical signs so not consistent with  LVO   Metrics: Last Known Well: 09/29/2024 01:00:00 Arrival Time: 09/29/2024 02:01:00 Activation Time: 09/29/2024 02:18:34 Initial Response Time: 09/29/2024 02:21:00 Symptoms: Headache, dizziness, paresthesias. Initial patient interaction: 09/29/2024 02:24:00 NIHSS Assessment Completed: 09/29/2024 02:30:26 Patient is not a candidate for Thrombolytic. Thrombolytic Medical Decision: 09/29/2024 02:30:29 Patient was not deemed candidate for Thrombolytic because of following reasons: Resolved symptoms .  CT Head: I personally reviewed all the CT images that were available to me and it showed: No acute abnormalities  Primary Provider Notified of Diagnostic Impression and Management Plan on: 09/29/2024 03:01:39    ------------------------------------------------------------------------------  History of Present Illness: Patient is a 58 year old Female.  Patient was brought by private transportation with symptoms of Headache, dizziness, paresthesias. Patient presents to the ED for evaluation of headache, dizziness and paresthesias. Patient states that yesterday morning she woke up with a headache and dizziness and checked her blood pressure and it was normal. She states that tonight at 1 AM she was getting ready for bed and watching a movie and simply became dizzy like the room was spinning, developed headache and blurry vision as well as tingling in her left hand. She also states that her body felt heavy. She checked her blood pressure again and it was 170/105. She reports that now the symptoms have improved and she no longer has any dizziness, vision changes or tingling in her extremities.    Past Medical History:  Hypertension      Hyperlipidemia Other PMH:  OSA  Medications:  No Anticoagulant use  No Antiplatelet use Reviewed EMR for current medications  Allergies:  Reviewed  Social History: Smoking: Former  Family History:  There is no family history of premature  cerebrovascular disease pertinent to this consultation  ROS : 14 Points Review of Systems was performed and was negative except mentioned in HPI.  Past Surgical History: There Is No Surgical History Contributory To Todays Visit     Examination: BP(166/104), Pulse(85), 1A: Level of Consciousness - Alert; keenly responsive + 0 1B: Ask Month and Age - Both Questions Right + 0 1C: Blink Eyes & Squeeze Hands - Performs Both Tasks + 0 2: Test Horizontal Extraocular Movements - Normal + 0 3: Test Visual Fields - No Visual Loss + 0 4: Test Facial Palsy (Use Grimace if Obtunded) - Normal symmetry + 0 5A: Test Left Arm Motor Drift - No Drift for 10 Seconds + 0 5B: Test Right Arm Motor Drift - No Drift for 10 Seconds + 0 6A: Test Left Leg Motor Drift - No Drift for 5 Seconds + 0 6B: Test Right Leg Motor Drift - No Drift for 5 Seconds + 0 7: Test Limb Ataxia (FNF/Heel-Shin) - No Ataxia + 0 8: Test Sensation - Normal; No sensory loss + 0 9: Test Language/Aphasia - Normal; No aphasia + 0 10: Test Dysarthria - Normal + 0 11: Test Extinction/Inattention - No abnormality + 0  NIHSS Score: 0  NIHSS Free Text : Gait normal  Pre-Morbid Modified Rankin Scale: 0 Points = No symptoms at all  Spoke with : Dr. Willo I reviewed the available imaging via [facility] and initiated discussion with the primary provider  This consult was conducted in real time using interactive audio and video technology. Patient was informed of the technology being used for this visit and agreed to proceed. Patient located in hospital and provider located at home/office setting.   Patient is being evaluated for possible acute neurologic impairment and high probability of imminent or life-threatening deterioration. I spent total of 40 minutes providing care to this patient, including time for face to face visit via telemedicine, review of medical records, imaging studies and discussion of findings with providers, the patient  and/or family.    Dr Sheena Cairo   [facility] For Inpatient follow-up with [facility] physician please call [facility] at [phone]. As we are not an outpatient service for any post hospital discharge needs please contact the hospital for assistance. If you have any questions for the [facility] physicians or need to reconsult for clinical or diagnostic changes please contact us  via [facility] at [phone].  Non-radiologist review of imaging performed to assist with emergent clinical decision-making. Remote physician workstations do not possess the same resolution, calibration, or diagnostic capabilities as hospital-based radiology reading stations, and formal radiologist read is necessary.   Signature : Sheena Cairo

## 2024-09-29 NOTE — Evaluation (Signed)
 Occupational Therapy Evaluation Patient Details Name: Felicia Barnett MRN: [id number] DOB: 09-Apr-1966 Today's Date: 09/29/2024   History of Present Illness   Felicia Barnett is a 58 y.o. female with medical history significant for left leg pain and numbness since 2014, following hysterectomy, hypertension, hyperlipidemia, prediabetes, obesity, OSA, who presents to the ER as a code stroke due to left arm numbness and weakness. All imaging negative, all symptoms have resolved as of 12/21.     Clinical Impressions Patient was seen for OT evaluation this date. Prior to hospital admission, patient was active and independent. Patient lives spouse who is able/willing to provide any support needed. All of patients symptoms have resolved, she is able to ambulate 200 feet without AD and without fatigue. She performed ADL without A from OT. UE strength/coordination/sensation is WNL and equal bilaterally.  Patient does not require further OT, OT to sign off. Patient agreeable.     If plan is discharge home, recommend the following:         Functional Status Assessment   Patient has not had a recent decline in their functional status     Equipment Recommendations   None recommended by OT     Recommendations for Other Services         Precautions/Restrictions   Precautions Precautions: Fall Recall of Precautions/Restrictions: Intact Restrictions Weight Bearing Restrictions Per Provider Order: No     Mobility Bed Mobility Overal bed mobility: Independent                  Transfers Overall transfer level: Independent Equipment used: None                      Balance Overall balance assessment: Independent                                         ADL either performed or assessed with clinical judgement   ADL Overall ADL's : Modified independent                                             Vision          Perception         Praxis         Pertinent Vitals/Pain Pain Assessment Pain Assessment: No/denies pain     Extremity/Trunk Assessment Upper Extremity Assessment Upper Extremity Assessment: Overall WFL for tasks assessed   Lower Extremity Assessment Lower Extremity Assessment: Overall WFL for tasks assessed       Communication Communication Communication: No apparent difficulties   Cognition Arousal: Alert Behavior During Therapy: WFL for tasks assessed/performed Cognition: No apparent impairments                               Following commands: Intact       Cueing  General Comments   Cueing Techniques: Verbal cues      Exercises     Shoulder Instructions      Home Living Family/patient expects to be discharged to:: Private residence Living Arrangements: Spouse/significant other Available Help at Discharge: Family;Available 24 hours/day Type of Home: House Home Access: Stairs to enter [employer] of Steps: 3   Home Layout: One  level     Bathroom Shower/Tub: Theme Park Manager: Yes How Accessible: Accessible via walker Home Equipment: None          Prior Functioning/Environment Prior Level of Function : Independent/Modified Independent             Mobility Comments: independent no AD` ADLs Comments: independent    OT Problem List:     OT Treatment/Interventions:        OT Goals(Current goals can be found in the care plan section)   Acute Rehab OT Goals Patient Stated Goal: to go home OT Goal Formulation: With patient   OT Frequency:       Co-evaluation              AM-PAC OT 6 Clicks Daily Activity     Outcome Measure Help from another person eating meals?: None Help from another person taking care of personal grooming?: None Help from another person toileting, which includes using toliet, bedpan, or urinal?: None Help from another person  bathing (including washing, rinsing, drying)?: None Help from another person to put on and taking off regular upper body clothing?: None Help from another person to put on and taking off regular lower body clothing?: None 6 Click Score: 24   End of Session Nurse Communication: Mobility status  Activity Tolerance: Patient tolerated treatment well Patient left: Other (comment) (in bathroom performing bath at sink)  OT Visit Diagnosis: Ataxia, unspecified (R27.0) (resolved)                Time: 8986-8964 OT Time Calculation (min): 22 min Charges:  OT General Charges $OT Visit: 1 Visit OT Evaluation $OT Eval Low Complexity: 1 Low  Rogers Clause, OT/L [facility], 09/29/2024

## 2024-09-29 NOTE — Progress Notes (Signed)
 PT Cancellation Note  Patient Details Name: Felicia Barnett MRN: [id number] DOB: 08/27/1966   Cancelled Treatment:    Reason Eval/Treat Not Completed: PT screened, no needs identified, will sign off (Alerted by OT that patient is independent with mobility with no apparent acute PT needs at this time.)  Randine Essex, PT, MPT   Randine LULLA Essex 09/29/2024, 12:56 PM

## 2024-09-29 NOTE — Plan of Care (Signed)

## 2024-09-29 NOTE — Discharge Summary (Signed)
 "  Physician Discharge Summary   Felicia Barnett  female DOB: Jan 15, 1966  FMW:[id number]  PCP: [name] LABOR, MD  Admit date: 09/29/2024 Discharge date: 09/29/2024  Admitted From: home Disposition:  home Family updated at bedside prior to discharge. CODE STATUS: Full code  Discharge Instructions     Diet - low sodium heart healthy   Complete by: As directed       Hospital Course:  For full details, please see H&P, progress notes, consult notes and ancillary notes.  Briefly,  Felicia Barnett is a 58 y.o. female with medical history significant for left leg pain and numbness since 2014, hypertension, hyperlipidemia, prediabetes, obesity, OSA, who presented to the ER as a code stroke due to left arm numbness and weakness.    Left arm numbness and weakness --MRI brain neg for stroke.  MRA head with No significant stenosis of the intracranial vasculature.  US  carotid with no significant stenosis.  Echo wnl with No atrial level shunt. --Pt reported she hadn't started taking Crestor  5 mg daily.  Given that LDL 192, pt was encouraged to start taking Crestor . --Pt not taking ASA daily PTA  Hyperlipidemia --Given that LDL 192, pt was encouraged to start taking Crestor . --cont home Zetia    Prediabetes, ruled out --A1c 5.6.  not diabetic.   Obesity BMI 35  OSA CPAP nightly   Unless noted above, medications under STOP list are ones pt was not taking PTA.  Discharge Diagnoses:  Principal Problem:   Stroke-like symptoms     Discharge Instructions:  Allergies as of 09/29/2024       Reactions   Iodinated Contrast Media Hives   Pt was injected with 85mL of Omnipaque  350 and approximately 15 minutes after injection she developed 5-6 hives all over. She was given 50mg  PO Benadryl  and Dr. Walden, our Radiologist came and evaluated her. She was observed for 1 hour and released without complications. SPM Pt was injected with 85mL of Omnipaque  350 and approximately 15  minutes after injection she developed 5-6 hives all over. She was given 50mg  PO Benadryl  and Dr. Walden, our Radiologist came and evaluated her. She was observed for 1 hour and released without complications. SPM        Medication List     STOP taking these medications    doxycycline  100 MG tablet Commonly known as: VIBRA -TABS   famotidine  20 MG tablet Commonly known as: PEPCID    furosemide  20 MG tablet Commonly known as: LASIX    ibuprofen  800 MG tablet Commonly known as: ADVIL    NIFEdipine  30 MG 24 hr tablet Commonly known as: PROCARDIA -XL/NIFEDICAL-XL   omeprazole  40 MG capsule Commonly known as: PRILOSEC   ondansetron  4 MG disintegrating tablet Commonly known as: ZOFRAN -ODT   oxyCODONE -acetaminophen  5-325 MG tablet Commonly known as: Percocet   vitamin C 100 MG tablet       TAKE these medications    aspirin  81 MG chewable tablet Chew by mouth as needed.   cholecalciferol 25 MCG (1000 UNIT) tablet Commonly known as: VITAMIN D3 Take 1,000 Units by mouth daily.   D3 + K2 125-100 MCG Caps Take 1 tablet by mouth daily at 6 (six) AM.   ezetimibe  10 MG tablet Commonly known as: ZETIA  Take 1 tablet (10 mg total) by mouth daily.   Magnesium  Citrate 200 MG Tabs Take by mouth.   nebivolol  2.5 MG tablet Commonly known as: BYSTOLIC  Take 2.5 mg by mouth in the morning, at noon, and at bedtime.  Omega-3 1000 MG Caps Take 2 g by mouth.   rosuvastatin  5 MG tablet Commonly known as: CRESTOR  Take 1 tablet (5 mg total) by mouth daily.   VITAMIN B 12 PO Take 1 mg by mouth every other day.         Follow-up Information     Aycock, Ngwe A, MD Follow up in 1 week(s).   Specialty: Family Medicine Contact information: [address] [place] [address] [phone]                 Allergies[1]   The results of significant diagnostics from this hospitalization (including imaging, microbiology, ancillary and laboratory) are listed  below for reference.   Consultations:   Procedures/Studies: MR ANGIO HEAD WO CONTRAST Result Date: 09/29/2024 EXAM: MR Angiography Head without intravenous contrast. 09/29/2024 05:13:00 AM TECHNIQUE: Magnetic resonance angiography images of the head without intravenous contrast. Multiplanar 2D and 3D reformatted images are provided for review. COMPARISON: None provided. CLINICAL HISTORY: Stroke suspected (Ped 0-17y) FINDINGS: ANTERIOR CIRCULATION: No significant stenosis of the internal carotid arteries. No significant stenosis of the anterior cerebral arteries. No significant stenosis of the middle cerebral arteries. No aneurysm. POSTERIOR CIRCULATION: No significant stenosis of the posterior cerebral arteries. No significant stenosis of the basilar artery. No significant stenosis of the vertebral arteries. No aneurysm. IMPRESSION: 1. No significant stenosis of the intracranial vasculature. Electronically signed by: Gilmore Molt 09/29/2024 05:17 AM EST RP Workstation: [id number]   MR BRAIN WO CONTRAST Result Date: 09/29/2024 EXAM: MRI BRAIN WITHOUT CONTRAST 09/29/2024 04:44:17 AM TECHNIQUE: Multiplanar multisequence MRI of the head/brain was performed without the administration of intravenous contrast. COMPARISON: CT of the head dated 09/29/2024 and MRI of the head dated 07/31/2020. CLINICAL HISTORY: Neuro deficit, acute, stroke suspected. FINDINGS: BRAIN AND VENTRICLES: No acute infarct. No intracranial hemorrhage. No mass. No midline shift. No hydrocephalus. The sella is unremarkable. Normal flow voids. ORBITS: No acute abnormality. SINUSES AND MASTOIDS: No acute abnormality. BONES AND SOFT TISSUES: Normal marrow signal. No acute soft tissue abnormality. IMPRESSION: 1. No acute intracranial abnormality. Electronically signed by: Evalene Coho MD 09/29/2024 04:51 AM EST RP Workstation: [id number]   CT HEAD CODE STROKE WO CONTRAST Result Date: 09/29/2024 CLINICAL DATA:  Code stroke. Initial  evaluation for acute neuro deficit, stroke suspected. Left-sided numbness. EXAM: CT HEAD WITHOUT CONTRAST TECHNIQUE: Contiguous axial images were obtained from the base of the skull through the vertex without intravenous contrast. RADIATION DOSE REDUCTION: This exam was performed according to the departmental dose-optimization program which includes automated exposure control, adjustment of the mA and/or kV according to patient size and/or use of iterative reconstruction technique. COMPARISON:  CT from 08/01/2023. FINDINGS: Brain: Cerebral volume within normal limits for patient age. No acute intracranial hemorrhage. No acute large vessel territory infarct. No mass lesion, midline shift, or mass effect. Ventricles are normal in size without hydrocephalus. No extra-axial fluid collection. Vascular: No abnormal hyperdense vessel. Skull: Scalp soft tissues demonstrate no acute abnormality. Calvarium intact. Sinuses/Orbits: Globes and orbital soft tissues within normal limits. Visualized paranasal sinuses are largely clear. No significant mastoid effusion. ASPECTS [facility] Stroke Program Early CT Score) - Ganglionic level infarction (caudate, lentiform nuclei, internal capsule, insula, M1-M3 cortex): 7 - Supraganglionic infarction (M4-M6 cortex): 3 Total score (0-10 with 10 being normal): 10 IMPRESSION: 1. Negative head CT. No acute intracranial abnormality. 2. Aspects is 10. Results were called by telephone at the time of interpretation on 09/29/2024 at 2:31 am to provider [facility] , who verbally acknowledged  these results. Electronically Signed   By: Morene Hoard M.D.   On: 09/29/2024 02:32      Labs: BNP (last 3 results) No results for input(s): BNP in the last 8760 hours. Basic Metabolic Panel: Recent Labs  Lab 09/29/24 0241  NA 140  K 3.8  CL 106  CO2 23  GLUCOSE 120*  BUN 13  CREATININE 0.62  CALCIUM  8.9   Liver Function Tests: Recent Labs  Lab 09/29/24 0241  AST 20  ALT  27  ALKPHOS 67  BILITOT 0.2  PROT 7.2  ALBUMIN 4.5   No results for input(s): LIPASE, AMYLASE in the last 168 hours. No results for input(s): AMMONIA in the last 168 hours. CBC: Recent Labs  Lab 09/29/24 0241  WBC 5.8  NEUTROABS 2.4  HGB 12.5  HCT 36.6  MCV 87.8  PLT 215   Cardiac Enzymes: No results for input(s): CKTOTAL, CKMB, CKMBINDEX, TROPONINI in the last 168 hours. BNP: Invalid input(s): POCBNP CBG: Recent Labs  Lab 09/29/24 0216 09/29/24 0651  GLUCAP 124* 108*   D-Dimer No results for input(s): DDIMER in the last 72 hours. Hgb A1c Recent Labs    09/29/24 0615  HGBA1C 5.6   Lipid Profile Recent Labs    09/29/24 0615  CHOL 261*  HDL 44  LDLCALC 192*  TRIG 126  CHOLHDL 5.9   Thyroid  function studies No results for input(s): TSH, T4TOTAL, T3FREE, THYROIDAB in the last 72 hours.  Invalid input(s): FREET3 Anemia work up No results for input(s): VITAMINB12, FOLATE, FERRITIN, TIBC, IRON, RETICCTPCT in the last 72 hours. Urinalysis    Component Value Date/Time   COLORURINE YELLOW (A) 01/31/2024 1929   APPEARANCEUR HAZY (A) 01/31/2024 1929   APPEARANCEUR Clear 09/20/2021 0832   LABSPEC 1.024 01/31/2024 1929   LABSPEC 1.004 11/02/2013 0112   PHURINE 5.0 01/31/2024 1929   GLUCOSEU NEGATIVE 01/31/2024 1929   GLUCOSEU Negative 11/02/2013 0112   HGBUR NEGATIVE 01/31/2024 1929   BILIRUBINUR negative 06/03/2024 1944   BILIRUBINUR Negative 09/20/2021 0832   BILIRUBINUR Negative 11/02/2013 0112   KETONESUR negative 06/03/2024 1944   KETONESUR NEGATIVE 01/31/2024 1929   PROTEINUR NEGATIVE 01/31/2024 1929   UROBILINOGEN 0.2 06/03/2024 1944   NITRITE Negative 06/03/2024 1944   NITRITE NEGATIVE 01/31/2024 1929   LEUKOCYTESUR Moderate (2+) (A) 06/03/2024 1944   LEUKOCYTESUR SMALL (A) 01/31/2024 1929   LEUKOCYTESUR Negative 11/02/2013 0112   Sepsis Labs Recent Labs  Lab 09/29/24 0241  WBC 5.8    Microbiology No results found for this or any previous visit (from the past 240 hours).   Total time spend on discharging this patient, including the last patient exam, discussing the hospital stay, instructions for ongoing care as it relates to all pertinent caregivers, as well as preparing the medical discharge records, prescriptions, and/or referrals as applicable, is 50 minutes.    Ellouise Haber, MD  [facility] 09/29/2024, 1:46 PM       [1]  Allergies Allergen Reactions   Iodinated Contrast Media Hives    Pt was injected with 85mL of Omnipaque  350 and approximately 15 minutes after injection she developed 5-6 hives all over. She was given 50mg  PO Benadryl  and Dr. Walden, our Radiologist came and evaluated her. She was observed for 1 hour and released without complications. SPM Pt was injected with 85mL of Omnipaque  350 and approximately 15 minutes after injection she developed 5-6 hives all over. She was given 50mg  PO Benadryl  and Dr. Walden, our Radiologist came and evaluated her. She  was observed for 1 hour and released without complications. SPM   "

## 2024-09-29 NOTE — ED Triage Notes (Signed)
 Code stroke activation in triage.   Pt arrives via POV with stroke like symptoms intermittent since yesterday. Pt currently presents with dizziness, headache, and left arm numbness for the past hour. Pt has had difficulty with her coordination that has now resolved.

## 2024-09-29 NOTE — ED Notes (Addendum)
 Pr the patient her symptoms started yesterday early afternoon. She had a headache and was dizzy with some numbness in left side. Symptoms went away and came back around 0100AM this morning. Pt also complaining of some heart fluttering.

## 2024-09-29 NOTE — ED Notes (Signed)
Code stroke activated ?

## 2024-09-29 NOTE — ED Provider Notes (Signed)
 "  [facility] Provider Note    Event Date/Time   First MD Initiated Contact with Patient 09/29/24 0216     (approximate)   History   Chief Complaint Code Stroke   HPI  Felicia Barnett is a 58 y.o. female with past medical history of hypertension, hyperlipidemia, diabetes, SVT, and anxiety who presents to the ED complaining of stroke symptoms.  Patient reports that around 1:00 this morning she had acute onset numbness and weakness in her left arm.  This was associated with some dizziness and feeling unsteady on her feet when she walked.  She also describes some palpitations and sense that her heart was racing.  She became more concerned when she checked her blood pressure and found it to be elevated, states she has been taking her blood pressure medications as prescribed.  She denies any vision changes or speech changes, has not noticed any drooping in her face.  She does report that she had a similar episode yesterday when waking up in the morning that resolved on its own.     Physical Exam   Triage Vital Signs: ED Triage Vitals  Encounter Vitals Group     BP 09/29/24 0208 (!) 166/104     Girls Systolic BP Percentile --      Girls Diastolic BP Percentile --      Boys Systolic BP Percentile --      Boys Diastolic BP Percentile --      Pulse Rate 09/29/24 0208 85     Resp 09/29/24 0208 20     Temp 09/29/24 0208 98 F (36.7 C)     Temp src --      SpO2 09/29/24 0208 94 %     Weight 09/29/24 0209 180 lb (81.6 kg)     Height 09/29/24 0209 5' (1.524 m)     Head Circumference --      Peak Flow --      Pain Score --      Pain Loc --      Pain Education --      Exclude from Growth Chart --     Most recent vital signs: Vitals:   09/29/24 0208  BP: (!) 166/104  Pulse: 85  Resp: 20  Temp: 98 F (36.7 C)  SpO2: 94%    Constitutional: Alert and oriented. Eyes: Conjunctivae are normal. Head: Atraumatic. Nose: No  congestion/rhinnorhea. Mouth/Throat: Mucous membranes are moist.  Cardiovascular: Normal rate, regular rhythm. Grossly normal heart sounds.  2+ radial pulses bilaterally. Respiratory: Normal respiratory effort.  No retractions. Lungs CTAB. Gastrointestinal: Soft and nontender. No distention. Musculoskeletal: No lower extremity tenderness nor edema.  Neurologic:  Normal speech and language. No gross focal neurologic deficits are appreciated.    ED Results / Procedures / Treatments   Labs (all labs ordered are listed, but only abnormal results are displayed) Labs Reviewed  COMPREHENSIVE METABOLIC PANEL WITH GFR - Abnormal; Notable for the following components:      Result Value   Glucose, Bld 120 (*)    All other components within normal limits  CBG MONITORING, ED - Abnormal; Notable for the following components:   Glucose-Capillary 124 (*)    All other components within normal limits  PROTIME-INR  APTT  CBC  DIFFERENTIAL  ETHANOL  HIV ANTIBODY (ROUTINE TESTING W REFLEX)  LIPID PANEL  HEMOGLOBIN A1C     EKG  ED ECG REPORT I, Carlin Palin, the attending physician, personally viewed and interpreted this ECG.  Date: 09/29/2024  EKG Time: 2:46  Rate: 66  Rhythm: normal sinus rhythm  Axis: LAD  Intervals:none  ST&T Change: None  RADIOLOGY CT head reviewed and interpreted by me with no hemorrhage or midline shift.  PROCEDURES:  Critical Care performed: No  Procedures   MEDICATIONS ORDERED IN ED: Medications  sodium chloride  flush (NS) 0.9 % injection 3 mL (3 mLs Intravenous Not Given 09/29/24 0246)  rosuvastatin  (CRESTOR ) tablet 5 mg (has no administration in time range)  ezetimibe  (ZETIA ) tablet 10 mg (has no administration in time range)  enoxaparin  (LOVENOX ) injection 40 mg (has no administration in time range)   stroke: early stages of recovery book (has no administration in time range)  aspirin  chewable tablet 324 mg (324 mg Oral Given 09/29/24 0353)      IMPRESSION / MDM / ASSESSMENT AND PLAN / ED COURSE  I reviewed the triage vital signs and the nursing notes.                              58 y.o. female with past medical history of hypertension, hyperlipidemia, diabetes, SVT, and anxiety who presents to the ED complaining of acute onset numbness and weakness in her left arm just prior to arrival.  Patient's presentation is most consistent with acute presentation with potential threat to life or bodily function.  Differential diagnosis includes, but is not limited to, stroke, TIA, hypoglycemia, anemia, electrolyte abnormality, AKI, anxiety, arrhythmia.  Patient nontoxic-appearing and in no acute distress, vital signs remarkable for hypertension but otherwise reassuring.  Code stroke activated on arrival and CT head is negative for acute process.  She does report that symptoms are improving and she does not seem to have any focal neurologic deficits on my exam.  She was evaluated by teleneurologist, will hold off on CTA at this time per their recommendation given no features concerning for LVO and she has a contrast allergy.  Labs without significant anemia, leukocytosis, electrolyte abnormality, or AKI.  LFTs are unremarkable.  Case discussed with hospitalist for admission for further stroke workup.      FINAL CLINICAL IMPRESSION(S) / ED DIAGNOSES   Final diagnoses:  Left arm numbness  Dizziness     Rx / DC Orders   ED Discharge Orders     None        Note:  This document was prepared using Dragon voice recognition software and may include unintentional dictation errors.   Willo Dunnings, MD 09/29/24 [phone]  "

## 2024-09-29 NOTE — H&P (Signed)
 " History and Physical  CAMY LEDER FMW:[id number] DOB: 01-31-1966 DOA: 09/29/2024  Referring physician: Dr. Willo, EDP  PCP: [name] LABOR, MD  Outpatient Specialists: Cardiology. Patient coming from: Home.  Chief Complaint: Left arm numbness and weakness.  HPI: Felicia Barnett is a 58 y.o. female with medical history significant for left leg pain and numbness since 2014, following hysterectomy, hypertension, hyperlipidemia, prediabetes, obesity, OSA, who presents to the ER as a code stroke due to left arm numbness and weakness.  Symptoms started intermittently since yesterday, resolved, and returned this morning around 1 AM.  Associated with dizziness and headache.  She endorses difficulty with coordination and dizziness that have now resolved.  Also endorses heart palpitations.  When she checked her blood pressure at home, it was significantly elevated.  EMS was activated due to concern for acute stroke.  BP 166/104.  Noncontrast head CT was nonacute.  Received a full dose aspirin  324 mg x 1.  Seen by neurology/stroke team, recommended admission by the medicine team and complete stroke workup.  Admitted by [facility], hospitalist service.  ED Course: Temperature 98.  BP 166/1 4, pulse 85, respiratory rate 20, O2 saturation 94% on room air.  Review of Systems: Review of systems as noted in the HPI. All other systems reviewed and are negative.   Past Medical History:  Diagnosis Date   Anxiety    since surgery, pt has had attacks w/ fear of not being able to move her leg (anxiety attacks occur  3-4x month.)   Diabetes mellitus without complication (HCC)    pre-diabetes   H/O prior ablation treatment    Heart murmur    palpatiations   Hyperlipidemia    Hypertension    SVT (supraventricular tachycardia)    Past Surgical History:  Procedure Laterality Date   ABDOMINAL HYSTERECTOMY     BREAST BIOPSY Right    neg,excisional bx   cardial ablation     anxiety attack (heart  palpitations HR=210 bpm) the day she was released from the hospital from surgery. Findings were neg .   COLONOSCOPY     ESOPHAGOGASTRODUODENOSCOPY (EGD) WITH PROPOFOL  N/A 08/12/2020   Procedure: ESOPHAGOGASTRODUODENOSCOPY (EGD) WITH PROPOFOL ;  Surgeon: Unk Corinn Skiff, MD;  Location: [facility] ENDOSCOPY;  Service: Gastroenterology;  Laterality: N/A;    Social History:  reports that she quit smoking about 10 years ago. Her smoking use included cigarettes. She started smoking about 52 years ago. She has a 10.5 pack-year smoking history. She has never used smokeless tobacco. She reports current alcohol use. She reports that she does not use drugs.   Allergies[1]  Family History  Problem Relation Age of Onset   Heart disease Mother    Diabetes Mother    Hypertension Mother    Heart attack Father    Diabetes Father    Hypertension Father    Breast cancer Neg Hx       Prior to Admission medications  Medication Sig Start Date End Date Taking? Authorizing Provider  nebivolol  (BYSTOLIC ) 2.5 MG tablet Take 2.5 mg by mouth in the morning, at noon, and at bedtime.   Yes [provider]  rosuvastatin  (CRESTOR ) 5 MG tablet Take 1 tablet (5 mg total) by mouth daily. 04/01/24 09/29/24 Yes Gollan, Timothy J, MD  Ascorbic Acid (VITAMIN C) 100 MG tablet Take 100 mg by mouth daily.    [provider]  aspirin  81 MG chewable tablet Chew by mouth as needed.    [provider]  cholecalciferol (  VITAMIN D ) 25 MCG (1000 UT) tablet Take 1,000 Units by mouth daily.    [provider]  Cyanocobalamin (VITAMIN B 12 PO) Take 1 mg by mouth every other day.     [provider]  doxycycline  (VIBRA -TABS) 100 MG tablet Take 1 tablet (100 mg total) by mouth 2 (two) times daily. Patient not taking: Reported on 04/08/2024 03/18/24   Ward, Josette SAILOR, DO  ezetimibe  (ZETIA ) 10 MG tablet Take 1 tablet (10 mg total) by mouth daily. 04/01/24 06/30/24  Gollan, Timothy J, MD  famotidine   (PEPCID ) 20 MG tablet Take 20 mg by mouth 2 (two) times daily. 10/09/20   [provider]  furosemide  (LASIX ) 20 MG tablet TAKE 1 TABLET BY MOUTH EVERY DAY AS NEEDED 04/23/24   Gollan, Timothy J, MD  ibuprofen  (ADVIL ) 800 MG tablet Take 1 tablet (800 mg total) by mouth every 8 (eight) hours as needed. 03/18/24   Ward, Josette SAILOR, DO  Magnesium  Citrate 200 MG TABS Take by mouth.    [provider]  NIFEdipine  (PROCARDIA -XL/NIFEDICAL-XL) 30 MG 24 hr tablet TAKE 1 TABLET BY MOUTH DAILY AS NEEDED (HIGH BLOOD PRESSURE). Patient not taking: Reported on 04/08/2024 07/05/23   Gollan, Timothy J, MD  Omega-3 1000 MG CAPS Take 2 g by mouth.    [provider]  omeprazole  (PRILOSEC) 40 MG capsule Take 1 capsule (40 mg total) by mouth 2 (two) times daily before a meal. Patient not taking: Reported on 04/08/2024 02/14/24   Unk Corinn Skiff, MD  ondansetron  (ZOFRAN -ODT) 4 MG disintegrating tablet Take 1 tablet (4 mg total) by mouth every 6 (six) hours as needed for nausea or vomiting. Patient not taking: Reported on 04/08/2024 03/18/24   Ward, Josette SAILOR, DO  oxyCODONE -acetaminophen  (PERCOCET) 5-325 MG tablet Take 2 tablets by mouth every 8 (eight) hours as needed. Patient not taking: Reported on 04/08/2024 03/18/24 03/18/25  Ward, Josette SAILOR, DO    Physical Exam: BP (!) 166/104 (BP Location: Left Arm)   Pulse 85   Temp 98 F (36.7 C)   Resp 20   Ht 5' (1.524 m)   Wt 81.6 kg   SpO2 94%   BMI 35.15 kg/m   General: 58 y.o. year-old female well developed well nourished in no acute distress.  Alert and oriented x3. Cardiovascular: Regular rate and rhythm with no rubs or gallops.  No thyromegaly or JVD noted.  No lower extremity edema. 2/4 pulses in all 4 extremities. Respiratory: Clear to auscultation with no wheezes or rales. Good inspiratory effort. Abdomen: Soft nontender nondistended with normal bowel sounds x4 quadrants. Muskuloskeletal: No cyanosis, clubbing or edema noted  bilaterally Neuro: CN II-XII intact, strength, sensation, reflexes Skin: No ulcerative lesions noted or rashes Psychiatry: Judgement and insight appear normal. Mood is appropriate for condition and setting          Labs on Admission:  Basic Metabolic Panel: Recent Labs  Lab 09/29/24 0241  NA 140  K 3.8  CL 106  CO2 23  GLUCOSE 120*  BUN 13  CREATININE 0.62  CALCIUM  8.9   Liver Function Tests: Recent Labs  Lab 09/29/24 0241  AST 20  ALT 27  ALKPHOS 67  BILITOT 0.2  PROT 7.2  ALBUMIN 4.5   No results for input(s): LIPASE, AMYLASE in the last 168 hours. No results for input(s): AMMONIA in the last 168 hours. CBC: Recent Labs  Lab 09/29/24 0241  WBC 5.8  NEUTROABS 2.4  HGB 12.5  HCT 36.6  MCV  87.8  PLT 215   Cardiac Enzymes: No results for input(s): CKTOTAL, CKMB, CKMBINDEX, TROPONINI in the last 168 hours.  BNP (last 3 results) No results for input(s): BNP in the last 8760 hours.  ProBNP (last 3 results) No results for input(s): PROBNP in the last 8760 hours.  CBG: Recent Labs  Lab 09/29/24 0216  GLUCAP 124*    Radiological Exams on Admission: CT HEAD CODE STROKE WO CONTRAST Result Date: 09/29/2024 CLINICAL DATA:  Code stroke. Initial evaluation for acute neuro deficit, stroke suspected. Left-sided numbness. EXAM: CT HEAD WITHOUT CONTRAST TECHNIQUE: Contiguous axial images were obtained from the base of the skull through the vertex without intravenous contrast. RADIATION DOSE REDUCTION: This exam was performed according to the departmental dose-optimization program which includes automated exposure control, adjustment of the mA and/or kV according to patient size and/or use of iterative reconstruction technique. COMPARISON:  CT from 08/01/2023. FINDINGS: Brain: Cerebral volume within normal limits for patient age. No acute intracranial hemorrhage. No acute large vessel territory infarct. No mass lesion, midline shift, or mass effect.  Ventricles are normal in size without hydrocephalus. No extra-axial fluid collection. Vascular: No abnormal hyperdense vessel. Skull: Scalp soft tissues demonstrate no acute abnormality. Calvarium intact. Sinuses/Orbits: Globes and orbital soft tissues within normal limits. Visualized paranasal sinuses are largely clear. No significant mastoid effusion. ASPECTS [facility] Stroke Program Early CT Score) - Ganglionic level infarction (caudate, lentiform nuclei, internal capsule, insula, M1-M3 cortex): 7 - Supraganglionic infarction (M4-M6 cortex): 3 Total score (0-10 with 10 being normal): 10 IMPRESSION: 1. Negative head CT. No acute intracranial abnormality. 2. Aspects is 10. Results were called by telephone at the time of interpretation on 09/29/2024 at 2:31 am to provider [facility] , who verbally acknowledged these results. Electronically Signed   By: Morene Hoard M.D.   On: 09/29/2024 02:32    EKG: I independently viewed the EKG done and my findings are as followed: Sinus rhythm rate of 66.  QTc 428  Assessment/Plan Present on Admission:  Stroke-like symptoms  Principal Problem:   Stroke-like symptoms  Strokelike symptoms, CVA versus TIA Noncontrast head CT was nonacute Follow-up MRI brain Due to contrast allergy, follow MRA head without contrast and bilateral carotid Doppler ultrasound. Follow-up transthoracic echocardiogram Fasting lipid panel and Hemoglobin A1c Permissive hypertension Treat SBP greater than 220 or DBP greater than 120 IV labetalol  as needed with parameters Received full dose aspirin  324 x 1 in the ER Continue with daily aspirin  81 mg. Resume home Crestor  Continue telemetry monitoring PT/OT/speech therapies evaluation Frequent neurochecks. Seen by neurology/stroke team  Hyperlipidemia Goal LDL less than 70 Resume home Crestor  and Zetia .  Prediabetes Goal hemoglobin A1c less than 7.0. Serum glucose 120 Diet controlled for now  Obesity BMI  35 Recommend weight loss outpatient with regular physical activity and healthy diet.  OSA CPAP nightly    Time: 75 minutes.    DVT prophylaxis: Subcu Lovenox  daily.  Code Status: Full code.  Family Communication: None at bedside.  Disposition Plan: Admitted to telemetry unit.  Consults called: Neurology/stroke team.  Admission status: Observation status.   Status is: Observation    Terry LOISE Hurst MD [facility] Pager [phone]  If 7PM-7AM, please contact night-coverage [email or web address] Password [facility]  09/29/2024, 4:04 AM      [1]  Allergies Allergen Reactions   Iodinated Contrast Media Hives    Pt was injected with 85mL of Omnipaque  350 and approximately 15 minutes after injection she developed 5-6 hives all over. She was given  50mg  PO Benadryl  and Dr. Walden, our Radiologist came and evaluated her. She was observed for 1 hour and released without complications. SPM Pt was injected with 85mL of Omnipaque  350 and approximately 15 minutes after injection she developed 5-6 hives all over. She was given 50mg  PO Benadryl  and Dr. Walden, our Radiologist came and evaluated her. She was observed for 1 hour and released without complications. SPM   "

## 2024-09-29 NOTE — Progress Notes (Signed)
 MD order received in [facility] to discharge pt home today; pt's home medications returned to the pt; verbally reviewed AVS with pt, no questions verbalized; gave pt letter indicating her admit and discharge dates with diagnosis for the pt's supplemental insurance company as requested by the pt; pt discharged via wheelchair by nursing to the [facility]

## 2024-09-29 NOTE — ED Notes (Signed)
 Called [facility] to Initiate Code Stroke @0211  spoke to Mze:Djoob who gathered demographics and symptoms to activate
